# Patient Record
Sex: Female | Born: 1975 | Race: White | Hispanic: No | Smoking: Former smoker
Health system: Southern US, Community
[De-identification: ages and names within clinical notes are randomized; demographics above are authoritative.]

## PROBLEM LIST (undated history)

## (undated) DIAGNOSIS — N6452 Nipple discharge: Secondary | ICD-10-CM

## (undated) DIAGNOSIS — Z800 Family history of malignant neoplasm of digestive organs: Secondary | ICD-10-CM

## (undated) DIAGNOSIS — F32A Depression, unspecified: Secondary | ICD-10-CM

## (undated) DIAGNOSIS — B2790 Infectious mononucleosis, unspecified without complication: Secondary | ICD-10-CM

## (undated) DIAGNOSIS — I10 Essential (primary) hypertension: Secondary | ICD-10-CM

## (undated) DIAGNOSIS — M797 Fibromyalgia: Secondary | ICD-10-CM

## (undated) DIAGNOSIS — J45909 Unspecified asthma, uncomplicated: Secondary | ICD-10-CM

## (undated) DIAGNOSIS — A6920 Lyme disease, unspecified: Secondary | ICD-10-CM

## (undated) DIAGNOSIS — J189 Pneumonia, unspecified organism: Secondary | ICD-10-CM

## (undated) DIAGNOSIS — A770 Spotted fever due to Rickettsia rickettsii: Secondary | ICD-10-CM

## (undated) DIAGNOSIS — F319 Bipolar disorder, unspecified: Secondary | ICD-10-CM

## (undated) DIAGNOSIS — F419 Anxiety disorder, unspecified: Secondary | ICD-10-CM

## (undated) HISTORY — DX: Infectious mononucleosis, unspecified without complication: B27.90

## (undated) HISTORY — PX: WISDOM TOOTH EXTRACTION: SHX21

## (undated) HISTORY — DX: Family history of malignant neoplasm of digestive organs: Z80.0

## (undated) HISTORY — DX: Essential (primary) hypertension: I10

## (undated) HISTORY — PX: TUBAL LIGATION: SHX77

## (undated) HISTORY — DX: Lyme disease, unspecified: A69.20

## (undated) HISTORY — DX: Bipolar disorder, unspecified: F31.9

---

## 1998-05-31 ENCOUNTER — Encounter: Admission: RE | Admit: 1998-05-31 | Discharge: 1998-08-29 | Payer: Self-pay | Admitting: *Deleted

## 2002-07-04 ENCOUNTER — Encounter: Payer: Self-pay | Admitting: Family Medicine

## 2002-07-04 ENCOUNTER — Ambulatory Visit (HOSPITAL_COMMUNITY): Admission: RE | Admit: 2002-07-04 | Discharge: 2002-07-04 | Payer: Self-pay | Admitting: Family Medicine

## 2005-04-07 ENCOUNTER — Other Ambulatory Visit: Admission: RE | Admit: 2005-04-07 | Discharge: 2005-04-07 | Payer: Self-pay | Admitting: Family Medicine

## 2005-04-07 ENCOUNTER — Other Ambulatory Visit: Admission: RE | Admit: 2005-04-07 | Discharge: 2005-04-07 | Payer: Self-pay | Admitting: *Deleted

## 2010-07-20 ENCOUNTER — Encounter: Payer: Self-pay | Admitting: Family Medicine

## 2011-12-19 ENCOUNTER — Other Ambulatory Visit: Payer: Self-pay | Admitting: Physician Assistant

## 2011-12-19 ENCOUNTER — Ambulatory Visit
Admission: RE | Admit: 2011-12-19 | Discharge: 2011-12-19 | Disposition: A | Discharge status: Home / Self Care | Source: Ambulatory Visit | Attending: Physician Assistant | Admitting: Physician Assistant

## 2011-12-19 DIAGNOSIS — M545 Low back pain: Secondary | ICD-10-CM

## 2012-01-14 ENCOUNTER — Other Ambulatory Visit: Payer: Self-pay | Admitting: Family Medicine

## 2012-01-14 DIAGNOSIS — IMO0002 Reserved for concepts with insufficient information to code with codable children: Secondary | ICD-10-CM

## 2012-01-17 ENCOUNTER — Ambulatory Visit
Admission: RE | Admit: 2012-01-17 | Discharge: 2012-01-17 | Disposition: A | Discharge status: Home / Self Care | Source: Ambulatory Visit | Attending: Family Medicine | Admitting: Family Medicine

## 2012-01-17 DIAGNOSIS — IMO0002 Reserved for concepts with insufficient information to code with codable children: Secondary | ICD-10-CM

## 2013-01-17 ENCOUNTER — Encounter: Payer: Self-pay | Admitting: Physician Assistant

## 2013-01-17 ENCOUNTER — Ambulatory Visit (INDEPENDENT_AMBULATORY_CARE_PROVIDER_SITE_OTHER): Admitting: Physician Assistant

## 2013-01-17 VITALS — BP 146/96 | HR 68 | Temp 98.0°F | Resp 18 | Ht 66.0 in | Wt 242.0 lb

## 2013-01-17 DIAGNOSIS — R51 Headache: Secondary | ICD-10-CM

## 2013-01-17 DIAGNOSIS — R5381 Other malaise: Secondary | ICD-10-CM

## 2013-01-17 DIAGNOSIS — R110 Nausea: Secondary | ICD-10-CM

## 2013-01-17 DIAGNOSIS — M62838 Other muscle spasm: Secondary | ICD-10-CM

## 2013-01-17 DIAGNOSIS — R21 Rash and other nonspecific skin eruption: Secondary | ICD-10-CM

## 2013-01-17 DIAGNOSIS — R5383 Other fatigue: Secondary | ICD-10-CM

## 2013-01-17 DIAGNOSIS — IMO0001 Reserved for inherently not codable concepts without codable children: Secondary | ICD-10-CM

## 2013-01-17 NOTE — Progress Notes (Signed)
Patient ID: Sharon Merritt MRN: [id number], DOB: Sep 05, 1975, 37 y.o. Date of Encounter: @DATE @  Chief Complaint:  Chief Complaint  Patient presents with  . Rash    HPI: 37 y.o. year old female  presents with the following complaints:  1- Starting 6 days ago: she has had the following:  Rash: Has "come and gone off and on"-had rash on arms and thighs. Then developed a larger area of pink rash on medial aspect of left ankle. All of this occurred that Tuesday and Wednesday (5-6 days ago). Has had headache, body aches all over, diffuse joint aches-"even in my fingeres and my toes". Feels extremedly tired-more than usual. Has seen no tics.  2- Nausea: Had this when she had her LOV here-saw Dr. Modesto Charon here 04/29/12. Says she had nausea evne back then. Thought it was sec to Nsaids. He told her to take omeprazole with nsaids. She says she did this for a while but then quit meds. Thought she only needed to take omeprzole when she tood nsaids. She stopped meds but nausea has not impoved. Feels nauseas "off and on at any time"-before eats, while eats, after eats. Never vomits. Says her bowels have always alternated b/t diarrhea/constipation-says this is the same-no change in bowel habits. No abdominal pain. Just nausea.   3-For years, her right leg would get a "twitch"-at random times. Now, for about a year, her right arm/hand has started doing it too. Says it is even embarrassing. Can be holding soething, and all the sudden, her hand/arm twitches and jerks. Has caused her to drp objects.    History reviewed. No pertinent past medical history.   Home Meds: See attached medication section for current medication list. Any medications entered into computer today will not appear on this note's list. The medications listed below were entered prior to today. No current outpatient prescriptions on file prior to visit.   No current facility-administered medications on file prior to visit.    Allergies: No  Known Allergies  History   Social History  . Marital Status: Married    Spouse Name: N/A    Number of Children: N/A  . Years of Education: N/A   Occupational History  . Not on file.   Social History Main Topics  . Smoking status: Not on file  . Smokeless tobacco: Not on file  . Alcohol Use: Not on file  . Drug Use: Not on file  . Sexually Active: Not on file   Other Topics Concern  . Not on file   Social History Narrative  . No narrative on file    No family history on file.   Review of Systems:  See HPI for pertinent ROS. All other ROS negative.    Physical Exam: Blood pressure 146/96, pulse 68, temperature 98 F (36.7 C), resp. rate 18, height 5\' 6"  (1.676 m), weight 242 lb (109.77 kg)., Body mass index is 39.08 kg/(m^2). General: Obese WF. Appears in no acute distress. Neck: Supple. No thyromegaly. No lymphadenopathy. Lungs: Clear bilaterally to auscultation without wheezes, rales, or rhonchi. Breathing is unlabored. Heart: RRR with S1 S2. No murmurs, rubs, or gallops. Abdomen: Soft, non-tender, non-distended with normoactive bowel sounds. No hepatomegaly. No rebound/guarding. No obvious abdominal masses.There is no area of tenderness with palpation, including RUQ and Epigastric area. Musculoskeletal:  Strength and tone normal for age. Extremities/Skin: Warm and dry. No clubbing or cyanosis. No edema. Rashes are resolved.  Neuro: Alert and oriented X 3. Moves all extremities spontaneously. Gait is  normal. CNII-XII grossly in tact. Psych:  Responds to questions appropriately with a normal affect.     ASSESSMENT AND PLAN:  37 y.o. year old female with   EPIC WAS DOWN DURING THE TIME OF PTS OV.  i GAVE HER A HANDWRITTEN RX FOR DOXYCYCLINE 100MG  ONE PO BID X 21 DAYS. SHE IS TO START THIS NOW.  DISCUSSED THAT LYME, RMSF TITERS CAN BE FALSELY NEGATIVE SO SHE WILL COMPLETE THE DOXY EVEN IF THESE ARE NEGATIVE.  wILL CHECK LABS TO CHECK FOR OTHER SOURCES OF HER  SYMPTOMS.   aLSO GAVE RX OMEPRAZOLE 20MG  ONE PO QD TO SEE IF THIS HELPS NAUEA. IF NO BETTER IN 3 WEEKS, F/U.  WILL CHECK  LABS (CALCIUM POTASSIUM ETC) IF THESE ARE NML, WILL REFER TO NEURO FOR EVAL OF MUSCLE SPASMS/TWITCHES  1. Rash and nonspecific skin eruption - Rocky [place] spotted fvr abs pnl(IgG+IgM) - B. burgdorfi antibodies by WB  2. Headache(784.0) - CBC with Differential - COMPLETE METABOLIC PANEL WITH GFR - TSH - Rocky [place]tn spotted fvr abs pnl(IgG+IgM) - B. burgdorfi antibodies by WB  3. Myalgia and myositis - CBC with Differential - COMPLETE METABOLIC PANEL WITH GFR - TSH - Rocky [place]tn spotted fvr abs pnl(IgG+IgM) - B. burgdorfi antibodies by WB - Sedimentation rate  4. Other malaise and fatigue - CBC with Differential - COMPLETE METABOLIC PANEL WITH GFR - TSH - Rocky mtn spotted fvr abs pnl(IgG+IgM) - B. burgdorfi antibodies by WB - Sedimentation rate  5. Nausea alone - Helicobacter pylori abs-IgG+IgA, bld  6. Muscle spasm - Ambulatory referral to Neurology   Signed, [facility], [place], [facility] 01/17/2013 11:05 AM

## 2013-01-18 LAB — CBC WITH DIFFERENTIAL/PLATELET
Basophils Relative: 0 % (ref 0–1)
Eosinophils Absolute: 0.1 10*3/uL (ref 0.0–0.7)
Eosinophils Relative: 2 % (ref 0–5)
Lymphs Abs: 1.7 10*3/uL (ref 0.7–4.0)
MCH: 30.7 pg (ref 26.0–34.0)
MCHC: 34 g/dL (ref 30.0–36.0)
MCV: 90.4 fL (ref 78.0–100.0)
Platelets: 267 10*3/uL (ref 150–400)
RBC: 4.46 MIL/uL (ref 3.87–5.11)
RDW: 13.1 % (ref 11.5–15.5)

## 2013-01-18 LAB — COMPLETE METABOLIC PANEL WITH GFR
ALT: 18 U/L (ref 0–35)
Alkaline Phosphatase: 75 U/L (ref 39–117)
CO2: 28 mEq/L (ref 19–32)
Creat: 0.87 mg/dL (ref 0.50–1.10)
GFR, Est African American: >89 mL/min
GFR, Est Non African American: 84 mL/min
Total Bilirubin: 0.4 mg/dL (ref 0.3–1.2)

## 2013-01-18 LAB — ROCKY MTN SPOTTED FVR ABS PNL(IGG+IGM)
RMSF IgG: 2.85 IV — ABNORMAL HIGH
RMSF IgM: 0.23 IV

## 2013-01-18 LAB — HELICOBACTER PYLORI ABS-IGG+IGA, BLD: HELICOBACTER PYLORI AB, IGA: 3.8 U/mL (ref <9.0)

## 2013-01-21 ENCOUNTER — Telehealth: Payer: Self-pay | Admitting: Physician Assistant

## 2013-01-23 NOTE — Telephone Encounter (Signed)
Completed  See lab notes

## 2013-01-25 ENCOUNTER — Telehealth: Payer: Self-pay | Admitting: Physician Assistant

## 2013-01-25 NOTE — Telephone Encounter (Signed)
Spoke to patient.  She had questions about the Doxycycline and her RMSF test.  All questions were answered

## 2013-02-14 ENCOUNTER — Emergency Department (HOSPITAL_COMMUNITY)

## 2013-02-14 ENCOUNTER — Encounter (HOSPITAL_COMMUNITY): Payer: Self-pay | Admitting: Emergency Medicine

## 2013-02-14 ENCOUNTER — Observation Stay (HOSPITAL_COMMUNITY)
Admission: EM | Admit: 2013-02-14 | Discharge: 2013-02-15 | Disposition: A | Discharge status: Home / Self Care | Attending: Internal Medicine | Admitting: Internal Medicine

## 2013-02-14 DIAGNOSIS — R0609 Other forms of dyspnea: Secondary | ICD-10-CM | POA: Insufficient documentation

## 2013-02-14 DIAGNOSIS — R079 Chest pain, unspecified: Secondary | ICD-10-CM

## 2013-02-14 DIAGNOSIS — E669 Obesity, unspecified: Secondary | ICD-10-CM

## 2013-02-14 DIAGNOSIS — R0989 Other specified symptoms and signs involving the circulatory and respiratory systems: Secondary | ICD-10-CM | POA: Insufficient documentation

## 2013-02-14 DIAGNOSIS — R0789 Other chest pain: Principal | ICD-10-CM

## 2013-02-14 DIAGNOSIS — K219 Gastro-esophageal reflux disease without esophagitis: Secondary | ICD-10-CM

## 2013-02-14 HISTORY — DX: Fibromyalgia: M79.7

## 2013-02-14 HISTORY — DX: Spotted fever due to Rickettsia rickettsii: A77.0

## 2013-02-14 LAB — CBC WITH DIFFERENTIAL/PLATELET
Basophils Absolute: 0 10*3/uL (ref 0.0–0.1)
Basophils Relative: 0 % (ref 0–1)
Eosinophils Absolute: 0 10*3/uL (ref 0.0–0.7)
Hemoglobin: 13.5 g/dL (ref 12.0–15.0)
MCH: 31.5 pg (ref 26.0–34.0)
MCHC: 34.1 g/dL (ref 30.0–36.0)
Neutro Abs: 3.5 10*3/uL (ref 1.7–7.7)
Neutrophils Relative %: 54 % (ref 43–77)
Platelets: 255 10*3/uL (ref 150–400)
RDW: 12.4 % (ref 11.5–15.5)

## 2013-02-14 LAB — BASIC METABOLIC PANEL
BUN: 8 mg/dL (ref 6–23)
Chloride: 99 mEq/L (ref 96–112)
GFR calc Af Amer: >90 mL/min (ref >90)
GFR calc non Af Amer: 85 mL/min — ABNORMAL LOW (ref >90)
Potassium: 3.7 mEq/L (ref 3.5–5.1)
Sodium: 137 mEq/L (ref 135–145)

## 2013-02-14 LAB — TROPONIN I
Troponin I: <0.3 ng/mL (ref <0.30)
Troponin I: <0.3 ng/mL (ref <0.30)

## 2013-02-14 MED ORDER — ASPIRIN 81 MG PO CHEW
324.0000 mg | CHEWABLE_TABLET | Freq: Once | ORAL | Status: AC
  Administered 2013-02-14: 81 mg via ORAL
  Filled 2013-02-14: qty 4

## 2013-02-14 MED ORDER — SODIUM CHLORIDE 0.9 % IV SOLN: 250.0000 mL | INTRAVENOUS | Status: DC | PRN

## 2013-02-14 MED ORDER — SODIUM CHLORIDE 0.9 % IJ SOLN: 3.0000 mL | INTRAMUSCULAR | Status: DC | PRN

## 2013-02-14 MED ORDER — SODIUM CHLORIDE 0.9 % IJ SOLN: 3.0000 mL | Freq: Two times a day (BID) | INTRAMUSCULAR | Status: DC

## 2013-02-14 MED ORDER — ENOXAPARIN SODIUM 40 MG/0.4ML ~~LOC~~ SOLN
40.0000 mg | SUBCUTANEOUS | Status: DC
  Administered 2013-02-15: 40 mg via SUBCUTANEOUS
  Filled 2013-02-14: qty 0.4

## 2013-02-14 MED ORDER — NITROGLYCERIN 0.4 MG SL SUBL
0.4000 mg | SUBLINGUAL_TABLET | SUBLINGUAL | Status: AC | PRN
  Administered 2013-02-14 (×3): 0.4 mg via SUBLINGUAL
  Filled 2013-02-14: qty 25

## 2013-02-14 MED ORDER — ASPIRIN EC 81 MG PO TBEC
81.0000 mg | DELAYED_RELEASE_TABLET | Freq: Every day | ORAL | Status: DC
  Administered 2013-02-15: 81 mg via ORAL
  Filled 2013-02-14: qty 1

## 2013-02-14 MED ORDER — PANTOPRAZOLE SODIUM 40 MG PO TBEC
40.0000 mg | DELAYED_RELEASE_TABLET | Freq: Every day | ORAL | Status: DC
  Administered 2013-02-15: 40 mg via ORAL
  Filled 2013-02-14: qty 1

## 2013-02-14 MED ORDER — ONDANSETRON HCL 4 MG/2ML IJ SOLN: 4.0000 mg | Freq: Four times a day (QID) | INTRAMUSCULAR | Status: DC | PRN

## 2013-02-14 MED ORDER — ACETAMINOPHEN 325 MG PO TABS: 650.0000 mg | ORAL_TABLET | Freq: Four times a day (QID) | ORAL | Status: DC | PRN

## 2013-02-14 MED ORDER — SODIUM CHLORIDE 0.9 % IJ SOLN
3.0000 mL | Freq: Two times a day (BID) | INTRAMUSCULAR | Status: DC
  Administered 2013-02-14 – 2013-02-15 (×2): 3 mL via INTRAVENOUS

## 2013-02-14 MED ORDER — ACETAMINOPHEN 650 MG RE SUPP: 650.0000 mg | Freq: Four times a day (QID) | RECTAL | Status: DC | PRN

## 2013-02-14 MED ORDER — ONDANSETRON HCL 4 MG PO TABS: 4.0000 mg | ORAL_TABLET | Freq: Four times a day (QID) | ORAL | Status: DC | PRN

## 2013-02-14 NOTE — H&P (Addendum)
[facility] History and Physical  Marjean Imperato ZOX:[id number] DOB: [birth year] DOA: 02/14/2013   PCP: Frazier Richards, PA-C  Specialists: None  Chief Complaint: Chest tightness  HPI: Sharon Merritt is a 37 y.o. female with a past medical history of obesity, fibromyalgia, who was in her usual state of health this morning when she was sitting and working on a computer and she started having left-sided chest tightness. It was 8/10 in intensity. It radiated to her arm and her neck. She also felt a heaviness in the chest. She was short of breath and had nausea, but no vomiting. She was dizzy, but denies any syncopal episodes. She did have palpitations. No fever or chills. Did feel cold. She has never had these symptoms before. She was given aspirin and nitroglycerin in the ED, and her pain resolved. She does have a history of acid reflux and used to take Prilosec but is not taking that anymore. She was also diagnosed with [facility] spotted fever in end of July and has completed a course of doxycycline. She denies any stress test in the past. Denies any leg swelling. She has had high blood pressure in the past and does not take any medication for the same.  Home Medications: Prior to Admission medications   Medication Sig Start Date End Date Taking? Authorizing Provider  doxycycline (VIBRA-TABS) 100 MG tablet Take 100 mg by mouth 2 (two) times daily. COMPLETED COURSE   Yes Historical Provider, MD  ibuprofen (ADVIL,MOTRIN) 200 MG tablet Take 400 mg by mouth every 6 (six) hours as needed for pain.   Yes Historical Provider, MD  omeprazole (PRILOSEC) 20 MG capsule Take 20 mg by mouth daily.   Yes Historical Provider, MD    Allergies: No Known Allergies  Past Medical History: Past Medical History  Diagnosis Date  . Fibromyalgia   . Hypertension   . [place] spotted fever     Past Surgical History  Procedure Laterality Date  . Tubal ligation      Social History:  reports that  she quit smoking about 4 years ago. She does not have any smokeless tobacco history on file. She reports that she does not drink alcohol or use illicit drugs.  Living Situation: She lives with her husband Activity Level: Independent with daily activities   Family History:  Family History  Problem Relation Age of Onset  . COPD Mother   . Diabetes Father   . Hypertension Father      Review of Systems - History obtained from the patient General ROS: negative Psychological ROS: negative Ophthalmic ROS: negative ENT ROS: negative Allergy and Immunology ROS: negative Hematological and Lymphatic ROS: negative Endocrine ROS: negative Respiratory ROS: as in hpi Cardiovascular ROS: as in hpi Gastrointestinal ROS: no abdominal pain, change in bowel habits, or black or bloody stools Genito-Urinary ROS: no dysuria, trouble voiding, or hematuria Musculoskeletal ROS: negative Neurological ROS: no TIA or stroke symptoms Dermatological ROS: negative  Physical Examination  Filed Vitals:   02/14/13 1953 02/14/13 2030 02/14/13 2044 02/14/13 2100  BP: 134/76 128/76 127/69 111/69  Pulse: 66 67  68  Temp:      TempSrc:      Resp: 11 9  10   Height:      Weight:      SpO2: 96%       General appearance: alert, cooperative, appears stated age and no distress Head: Normocephalic, without obvious abnormality, atraumatic Eyes: conjunctivae/corneas clear. PERRL, EOM's intact. Throat: lips, mucosa, and tongue normal;  teeth and gums normal Neck: no adenopathy, no carotid bruit, no JVD, supple, symmetrical, trachea midline and thyroid not enlarged, symmetric, no tenderness/mass/nodules Back: symmetric, no curvature. ROM normal. No CVA tenderness. Resp: clear to auscultation bilaterally Cardio: regular rate and rhythm, S1, S2 normal, no murmur, click, rub or gallop GI: soft, non-tender; bowel sounds normal; no masses,  no organomegaly Extremities: extremities normal, atraumatic, no cyanosis or  edema Pulses: 2+ and symmetric Skin: Skin color, texture, turgor normal. No rashes or lesions Lymph nodes: Cervical, supraclavicular, and axillary nodes normal. Neurologic: She is alert and oriented x3. No focal neurological deficits are present.  Laboratory Data: Results for orders placed during the hospital encounter of 02/14/13 (from the past 48 hour(s))  CBC WITH DIFFERENTIAL     Status: None   Collection Time    02/14/13  7:29 PM      Result Value Range   WBC 6.5  4.0 - 10.5 K/uL   RBC 4.28  3.87 - 5.11 MIL/uL   Hemoglobin 13.5  12.0 - 15.0 g/dL   HCT 16.1  09.6 - 04.5 %   MCV 92.5  78.0 - 100.0 fL   MCH 31.5  26.0 - 34.0 pg   MCHC 34.1  30.0 - 36.0 g/dL   RDW 40.9  81.1 - 91.4 %   Platelets 255  150 - 400 K/uL   Neutrophils Relative % 54  43 - 77 %   Neutro Abs 3.5  1.7 - 7.7 K/uL   Lymphocytes Relative 36  12 - 46 %   Lymphs Abs 2.4  0.7 - 4.0 K/uL   Monocytes Relative 9  3 - 12 %   Monocytes Absolute 0.6  0.1 - 1.0 K/uL   Eosinophils Relative 1  0 - 5 %   Eosinophils Absolute 0.0  0.0 - 0.7 K/uL   Basophils Relative 0  0 - 1 %   Basophils Absolute 0.0  0.0 - 0.1 K/uL  BASIC METABOLIC PANEL     Status: Abnormal   Collection Time    02/14/13  7:29 PM      Result Value Range   Sodium 137  135 - 145 mEq/L   Potassium 3.7  3.5 - 5.1 mEq/L   Chloride 99  96 - 112 mEq/L   CO2 27  19 - 32 mEq/L   Glucose, Bld 87  70 - 99 mg/dL   BUN 8  6 - 23 mg/dL   Creatinine, Ser 7.82  0.50 - 1.10 mg/dL   Calcium 9.2  8.4 - 95.6 mg/dL   GFR calc non Af Amer 85 (*) >90 mL/min   GFR calc Af Amer >90  >90 mL/min   Comment: (NOTE)     The eGFR has been calculated using the CKD EPI equation.     This calculation has not been validated in all clinical situations.     eGFR's persistently <90 mL/min signify possible Chronic Kidney     Disease.  TROPONIN I     Status: None   Collection Time    02/14/13  7:29 PM      Result Value Range   Troponin I <0.30  <0.30 ng/mL   Comment:             Due to the release kinetics of cTnI,     a negative result within the first hours     of the onset of symptoms does not rule out     myocardial infarction with certainty.  If myocardial infarction is still suspected,     repeat the test at appropriate intervals.  D-DIMER, QUANTITATIVE     Status: None   Collection Time    02/14/13  7:29 PM      Result Value Range   D-Dimer, Quant <0.27  0.00 - 0.48 ug/mL-FEU   Comment:            AT THE INHOUSE ESTABLISHED CUTOFF     VALUE OF 0.48 ug/mL FEU,     THIS ASSAY HAS BEEN DOCUMENTED     IN THE LITERATURE TO HAVE     A SENSITIVITY AND NEGATIVE     PREDICTIVE VALUE OF AT LEAST     98 TO 99%.  THE TEST RESULT     SHOULD BE CORRELATED WITH     AN ASSESSMENT OF THE CLINICAL     PROBABILITY OF DVT / VTE.  TROPONIN I     Status: None   Collection Time    02/14/13  9:31 PM      Result Value Range   Troponin I <0.30  <0.30 ng/mL   Comment:            Due to the release kinetics of cTnI,     a negative result within the first hours     of the onset of symptoms does not rule out     myocardial infarction with certainty.     If myocardial infarction is still suspected,     repeat the test at appropriate intervals.    Radiology Reports: Dg Chest 2 View  02/14/2013   *RADIOLOGY REPORT*  Clinical Data: Chest pain and palpitations  CHEST - 2 VIEW  Comparison: None.  Findings: The heart, mediastinum and hila are unremarkable.  The lungs hyperexpanded with flat hemidiaphragms.  There is minor scarring in the anterior lung bases.  The lungs otherwise clear.  No pleural effusion or pneumothorax.  The bony thorax is intact.  IMPRESSION: No acute cardiopulmonary disease.  Findings suggest COPD.   Original Report Authenticated By: Amie Portland, M.D.    Electrocardiogram: Sinus rhythm at 70 beats per minute. Normal axis. Intervals are normal. No definite Q waves although there is suspicious looking tracings in V1 and V2. No acute ST or T-wave  changes are noted.  Problem List  Principal Problem:   Chest tightness Active Problems:   Obesity   GERD (gastroesophageal reflux disease)   Assessment: This is a 37 year old, Caucasian female, who presents with left-sided chest tightness at rest. Some of the features of the pain are concerning for angina. Her risk factors for CAD include obesity. She is a former smoker. She has been told that she has elevated cholesterol, but hasn't required treatment. Differential diagnoses include pain due to GI reasons. VTE is less likely, due to the character of the pain and the fact, that the pain resolved with nitroglycerin. Plus d-dimer is normal.  Plan: #1 chest tightness: She'll be observed overnight. Troponins will be ordered. TSH will be checked. Echocardiogram will be ordered. If she rules out she will require a stress test, which can be pursued as an outpatient. We'll give her aspirin. Lipid panel will be checked in the morning.  #2 Obesity: She will require, weight loss counseling.  #3 GERD: Resume PPI  DVT Prophylaxis: Lovenox Code Status: Full code Family Communication: Discussed with the patient and her husband  Disposition Plan: Observe on telemetry   Further management decisions will depend on results of further testing and  patient's response to treatment.  [facility]  [facility] Pager [phone]  If 7PM-7AM, please contact night-coverage [email or web address] Password [facility]  02/14/2013, 10:34 PM

## 2013-02-14 NOTE — ED Provider Notes (Signed)
CSN: [id number]     Arrival date & time 02/14/13  1709 History     First MD Initiated Contact with Patient 02/14/13 1729     Chief Complaint  Patient presents with  . Chest Pain  . Palpitations   (Consider location/radiation/quality/duration/timing/severity/associated sxs/prior Treatment) HPI Comments: 37 yo female with fibromyalgia, past smoker, recent RMSF/ completed abx presents with chest heaviness with right arm pain this am, mild dyspnea, intermittent sxs lasting minutes.  No exertional or diaphoresis. Mild nausea. Patient denies blood clot history, active cancer, recent major trauma or surgery, unilateral leg swelling/ pain, recent long travel, hemoptysis or oral contraceptives.  No known heart issues.  Nothing specific worsens.  Very mild heaviness in ED.   Patient is a 37 y.o. female presenting with chest pain and palpitations. The history is provided by the patient.  Chest Pain Pain location:  Substernal area Associated symptoms: nausea, palpitations and shortness of breath   Associated symptoms: no abdominal pain, no back pain, no fever, no headache and not vomiting   Palpitations Associated symptoms: chest pain, nausea and shortness of breath   Associated symptoms: no back pain and no vomiting     Past Medical History  Diagnosis Date  . Fibromyalgia   . Hypertension   . [facility] spotted fever    History reviewed. No pertinent past surgical history. No family history on file. History  Substance Use Topics  . Smoking status: Not on file  . Smokeless tobacco: Not on file  . Alcohol Use: Not on file   OB History   Grav Para Term Preterm Abortions TAB SAB Ect Mult Living                 Review of Systems  Constitutional: Negative for fever and chills.  HENT: Negative for neck pain and neck stiffness.   Eyes: Negative for visual disturbance.  Respiratory: Positive for shortness of breath.   Cardiovascular: Positive for chest pain and palpitations.   Gastrointestinal: Positive for nausea. Negative for vomiting and abdominal pain.  Genitourinary: Negative for dysuria and flank pain.  Musculoskeletal: Negative for back pain.  Skin: Negative for rash.  Neurological: Negative for light-headedness and headaches.    Allergies  Review of patient's allergies indicates no known allergies.  Home Medications   Current Outpatient Rx  Name  Route  Sig  Dispense  Refill  . doxycycline (VIBRA-TABS) 100 MG tablet   Oral   Take 100 mg by mouth 2 (two) times daily. COMPLETED COURSE         . ibuprofen (ADVIL,MOTRIN) 200 MG tablet   Oral   Take 400 mg by mouth every 6 (six) hours as needed for pain.         Marland Kitchen omeprazole (PRILOSEC) 20 MG capsule   Oral   Take 20 mg by mouth daily.          BP 152/90  Pulse 71  Temp(Src) 97.9 F (36.6 C) (Oral)  Resp 20  Ht 5\' 8"  (1.727 m)  Wt 237 lb (107.502 kg)  BMI 36.04 kg/m2  SpO2 100%  LMP 02/07/2013 Physical Exam  Nursing note and vitals reviewed. Constitutional: She is oriented to person, place, and time. She appears well-developed and well-nourished.  HENT:  Head: Normocephalic and atraumatic.  Eyes: Conjunctivae are normal. Right eye exhibits no discharge. Left eye exhibits no discharge.  Neck: Normal range of motion. Neck supple. No tracheal deviation present.  Cardiovascular: Normal rate, regular rhythm and intact distal pulses.  No murmur heard. Pulmonary/Chest: Effort normal and breath sounds normal.  Abdominal: Soft. She exhibits no distension. There is no tenderness. There is no guarding.  Musculoskeletal: She exhibits no edema and no tenderness.  Neurological: She is alert and oriented to person, place, and time.  Skin: Skin is warm. No rash noted.  Psychiatric: She has a normal mood and affect.    ED Course   Procedures (including critical care time)  Labs Reviewed  BASIC METABOLIC PANEL - Abnormal; Notable for the following:    GFR calc non Af Amer 85 (*)    All  other components within normal limits  CBC WITH DIFFERENTIAL  TROPONIN I  D-DIMER, QUANTITATIVE  TROPONIN I   No results found. No diagnosis found.  MDM  Well appearing.  Low risk cardiac.  No PE risks.  Vitals unremarkable. Plan for cardiac/ CXR screening. ASA GIVEN.   Date: 02/14/2013  Rate: 72  Rhythm: normal sinus rhythm  QRS Axis: normal  Intervals: normal  ST/T Wave abnormalities: nonspecific ST changes q wave V1/ V2  Conduction Disutrbances:none  Narrative Interpretation:   Old EKG Reviewed: none available Dg Chest 2 View  02/14/2013   *RADIOLOGY REPORT*  Clinical Data: Chest pain and palpitations  CHEST - 2 VIEW  Comparison: None.  Findings: The heart, mediastinum and hila are unremarkable.  The lungs hyperexpanded with flat hemidiaphragms.  There is minor scarring in the anterior lung bases.  The lungs otherwise clear.  No pleural effusion or pneumothorax.  The bony thorax is intact.  IMPRESSION: No acute cardiopulmonary disease.  Findings suggest COPD.   Original Report Authenticated By: Amie Portland, M.D.    Chest pain intermittent in ED, improved with nitro.  No old ekg.  Possible old septal.  Discussed outpt vs observation/tele, pt okay with observation.  Paged hospitalist for observation, they evaluated.     Enid Skeens, MD 02/16/13 [phone]

## 2013-02-14 NOTE — ED Notes (Signed)
States that she started having chest heaviness with right arm pain this morning.  States that she also felt short of breath.  States that her symptoms are intermittent.  States that she was recently diagnosed with [facility] Spotted Fever.

## 2013-02-15 DIAGNOSIS — R079 Chest pain, unspecified: Secondary | ICD-10-CM

## 2013-02-15 DIAGNOSIS — R072 Precordial pain: Secondary | ICD-10-CM

## 2013-02-15 LAB — CBC
HCT: 38.8 % (ref 36.0–46.0)
Hemoglobin: 13 g/dL (ref 12.0–15.0)
MCH: 31.3 pg (ref 26.0–34.0)
MCV: 93.3 fL (ref 78.0–100.0)
RBC: 4.16 MIL/uL (ref 3.87–5.11)

## 2013-02-15 LAB — COMPREHENSIVE METABOLIC PANEL
AST: 17 U/L (ref 0–37)
Albumin: 3.5 g/dL (ref 3.5–5.2)
Calcium: 8.8 mg/dL (ref 8.4–10.5)
Chloride: 102 mEq/L (ref 96–112)
Creatinine, Ser: 0.9 mg/dL (ref 0.50–1.10)
Total Bilirubin: 0.4 mg/dL (ref 0.3–1.2)
Total Protein: 7 g/dL (ref 6.0–8.3)

## 2013-02-15 LAB — LIPID PANEL
Cholesterol: 170 mg/dL (ref 0–200)
HDL: 43 mg/dL (ref >39)
Total CHOL/HDL Ratio: 4 RATIO
Triglycerides: 134 mg/dL (ref <150)
VLDL: 27 mg/dL (ref 0–40)

## 2013-02-15 MED ORDER — ASPIRIN 81 MG PO TBEC: 81.0000 mg | DELAYED_RELEASE_TABLET | Freq: Every day | ORAL | Status: DC

## 2013-02-15 NOTE — Progress Notes (Signed)
Discharged home today with instructions given on medications,and follow up visits,patient verbalized understanding.No c/o pain or discomfort noted .Vital signs stable. Accompanied by staff to an awaiting vehicle.

## 2013-02-15 NOTE — Discharge Summary (Addendum)
Physician Discharge Summary  Sharon Merritt MRN: [id number] DOB/AGE: Apr 29, 1976 37 y.o.  PCP: Sharon Richards, PA-C   Admit date: 02/14/2013 Discharge date: 02/15/2013  Discharge Diagnoses:    Chest pain noncardiac Active Problems:   Obesity   GERD (gastroesophageal reflux disease)   Followup PCP to arrange for outpatient stress test    Medication List    STOP taking these medications       doxycycline 100 MG tablet  Commonly known as:  VIBRA-TABS     ibuprofen 200 MG tablet  Commonly known as:  ADVIL,MOTRIN      TAKE these medications       aspirin 81 MG EC tablet  Take 1 tablet (81 mg total) by mouth daily.     omeprazole 20 MG capsule  Commonly known as:  PRILOSEC  Take 20 mg by mouth daily.        Discharge Condition: Stable  Disposition: Final discharge disposition not confirmed   Consults: None  Significant Diagnostic Studies: Dg Chest 2 View  02/14/2013   *RADIOLOGY REPORT*  Clinical Data: Chest pain and palpitations  CHEST - 2 VIEW  Comparison: None.  Findings: The heart, mediastinum and hila are unremarkable.  The lungs hyperexpanded with flat hemidiaphragms.  There is minor scarring in the anterior lung bases.  The lungs otherwise clear.  No pleural effusion or pneumothorax.  The bony thorax is intact.  IMPRESSION: No acute cardiopulmonary disease.  Findings suggest COPD.   Original Report Authenticated By: Amie Portland, M.D.    2-D echo pending   Microbiology: No results found for this or any previous visit (from the past 240 hour(s)).   Labs: Results for orders placed during the hospital encounter of 02/14/13 (from the past 48 hour(s))  CBC WITH DIFFERENTIAL     Status: None   Collection Time    02/14/13  7:29 PM      Result Value Range   WBC 6.5  4.0 - 10.5 K/uL   RBC 4.28  3.87 - 5.11 MIL/uL   Hemoglobin 13.5  12.0 - 15.0 g/dL   HCT 40.9  81.1 - 91.4 %   MCV 92.5  78.0 - 100.0 fL   MCH 31.5  26.0 - 34.0 pg   MCHC 34.1  30.0  - 36.0 g/dL   RDW 78.2  95.6 - 21.3 %   Platelets 255  150 - 400 K/uL   Neutrophils Relative % 54  43 - 77 %   Neutro Abs 3.5  1.7 - 7.7 K/uL   Lymphocytes Relative 36  12 - 46 %   Lymphs Abs 2.4  0.7 - 4.0 K/uL   Monocytes Relative 9  3 - 12 %   Monocytes Absolute 0.6  0.1 - 1.0 K/uL   Eosinophils Relative 1  0 - 5 %   Eosinophils Absolute 0.0  0.0 - 0.7 K/uL   Basophils Relative 0  0 - 1 %   Basophils Absolute 0.0  0.0 - 0.1 K/uL  BASIC METABOLIC PANEL     Status: Abnormal   Collection Time    02/14/13  7:29 PM      Result Value Range   Sodium 137  135 - 145 mEq/L   Potassium 3.7  3.5 - 5.1 mEq/L   Chloride 99  96 - 112 mEq/L   CO2 27  19 - 32 mEq/L   Glucose, Bld 87  70 - 99 mg/dL   BUN 8  6 - 23 mg/dL  Creatinine, Ser 0.85  0.50 - 1.10 mg/dL   Calcium 9.2  8.4 - 62.9 mg/dL   GFR calc non Af Amer 85 (*) >90 mL/min   GFR calc Af Amer >90  >90 mL/min   Comment: (NOTE)     The eGFR has been calculated using the CKD EPI equation.     This calculation has not been validated in all clinical situations.     eGFR's persistently <90 mL/min signify possible Chronic Kidney     Disease.  TROPONIN I     Status: None   Collection Time    02/14/13  7:29 PM      Result Value Range   Troponin I <0.30  <0.30 ng/mL   Comment:            Due to the release kinetics of cTnI,     a negative result within the first hours     of the onset of symptoms does not rule out     myocardial infarction with certainty.     If myocardial infarction is still suspected,     repeat the test at appropriate intervals.  D-DIMER, QUANTITATIVE     Status: None   Collection Time    02/14/13  7:29 PM      Result Value Range   D-Dimer, Quant <0.27  0.00 - 0.48 ug/mL-FEU   Comment:            AT THE INHOUSE ESTABLISHED CUTOFF     VALUE OF 0.48 ug/mL FEU,     THIS ASSAY HAS BEEN DOCUMENTED     IN THE LITERATURE TO HAVE     A SENSITIVITY AND NEGATIVE     PREDICTIVE VALUE OF AT LEAST     98 TO 99%.  THE  TEST RESULT     SHOULD BE CORRELATED WITH     AN ASSESSMENT OF THE CLINICAL     PROBABILITY OF DVT / VTE.  TROPONIN I     Status: None   Collection Time    02/14/13  9:31 PM      Result Value Range   Troponin I <0.30  <0.30 ng/mL   Comment:            Due to the release kinetics of cTnI,     a negative result within the first hours     of the onset of symptoms does not rule out     myocardial infarction with certainty.     If myocardial infarction is still suspected,     repeat the test at appropriate intervals.  TROPONIN I     Status: None   Collection Time    02/15/13  3:30 AM      Result Value Range   Troponin I <0.30  <0.30 ng/mL   Comment:            Due to the release kinetics of cTnI,     a negative result within the first hours     of the onset of symptoms does not rule out     myocardial infarction with certainty.     If myocardial infarction is still suspected,     repeat the test at appropriate intervals.  COMPREHENSIVE METABOLIC PANEL     Status: Abnormal   Collection Time    02/15/13  3:40 AM      Result Value Range   Sodium 138  135 - 145 mEq/L   Potassium 3.8  3.5 - 5.1 mEq/L  Chloride 102  96 - 112 mEq/L   CO2 28  19 - 32 mEq/L   Glucose, Bld 96  70 - 99 mg/dL   BUN 8  6 - 23 mg/dL   Creatinine, Ser 7.82  0.50 - 1.10 mg/dL   Calcium 8.8  8.4 - 95.6 mg/dL   Total Protein 7.0  6.0 - 8.3 g/dL   Albumin 3.5  3.5 - 5.2 g/dL   AST 17  0 - 37 U/L   ALT 15  0 - 35 U/L   Alkaline Phosphatase 70  39 - 117 U/L   Total Bilirubin 0.4  0.3 - 1.2 mg/dL   GFR calc non Af Amer 80 (*) >90 mL/min   GFR calc Af Amer >90  >90 mL/min   Comment: (NOTE)     The eGFR has been calculated using the CKD EPI equation.     This calculation has not been validated in all clinical situations.     eGFR's persistently <90 mL/min signify possible Chronic Kidney     Disease.  CBC     Status: None   Collection Time    02/15/13  3:40 AM      Result Value Range   WBC 6.8  4.0 - 10.5  K/uL   RBC 4.16  3.87 - 5.11 MIL/uL   Hemoglobin 13.0  12.0 - 15.0 g/dL   HCT 21.3  08.6 - 57.8 %   MCV 93.3  78.0 - 100.0 fL   MCH 31.3  26.0 - 34.0 pg   MCHC 33.5  30.0 - 36.0 g/dL   RDW 46.9  62.9 - 52.8 %   Platelets 244  150 - 400 K/uL  LIPID PANEL     Status: Abnormal   Collection Time    02/15/13  3:40 AM      Result Value Range   Cholesterol 170  0 - 200 mg/dL   Triglycerides 413  <244 mg/dL   HDL 43  >01 mg/dL   Total CHOL/HDL Ratio 4.0     VLDL 27  0 - 40 mg/dL   LDL Cholesterol 027 (*) 0 - 99 mg/dL   Comment:            Total Cholesterol/HDL:CHD Risk     Coronary Heart Disease Risk Table                         Men   Women      1/2 Average Risk   3.4   3.3      Average Risk       5.0   4.4      2 X Average Risk   9.6   7.1      3 X Average Risk  23.4   11.0                Use the calculated Patient Ratio     above and the CHD Risk Table     to determine the patient's CHD Risk.                ATP III CLASSIFICATION (LDL):      <100     mg/dL   Optimal      253-664  mg/dL   Near or Above                        Optimal      130-159  mg/dL  Borderline      160-189  mg/dL   High      >161     mg/dL   Very High  TROPONIN I     Status: None   Collection Time    02/15/13  9:08 AM      Result Value Range   Troponin I <0.30  <0.30 ng/mL   Comment:            Due to the release kinetics of cTnI,     a negative result within the first hours     of the onset of symptoms does not rule out     myocardial infarction with certainty.     If myocardial infarction is still suspected,     repeat the test at appropriate intervals.     HPI :Sharon Merritt is a 37 y.o. female with a past medical history of obesity, fibromyalgia, who was in her usual state of health this morning when she was sitting and working on a computer and she started having left-sided chest tightness. It was 8/10 in intensity. It radiated to her arm and her neck. She also felt a heaviness in the chest.  She was short of breath and had nausea, but no vomiting. She was dizzy, but denies any syncopal episodes. She did have palpitations. No fever or chills. Did feel cold. She has never had these symptoms before. She was given aspirin and nitroglycerin in the ED, and her pain resolved. She does have a history of acid reflux and used to take Prilosec but is not taking that anymore. She was also diagnosed with [facility] spotted fever in end of July and has completed a course of doxycycline. She denies any stress test in the past. Denies any leg swelling. She has had high blood pressure in the past and does not take any medication for the same.   HOSPITAL COURSE:  Chest pain D-dimer negative Cardiac enzymes negative Lipid panel within normal limits Patient recommended to take a baby aspirin She is morbidly obese and previous smoker and will benefit from an outpatient stress test 2-D echo pending, once completed the patient will be discharged home today Patient also has a history of fibromyalgia and her pain is likely musculoskeletal She also takes ibuprofen and most likely has a component of gastroesophageal reflux disease and therefore she will continue with Prilosec   Morbid obesity weight loss counseling, done, patient advised to continue with exercises at least 2-3 times a week     Discharge Exam:   Blood pressure 118/74, pulse 66, temperature 98 F (36.7 C), temperature source Oral, resp. rate 16, height 5\' 8"  (1.727 m), weight 108.6 kg (239 lb 6.7 oz), last menstrual period 02/07/2013, SpO2 96.00%. General appearance: alert, cooperative, appears stated age and no distress  Head: Normocephalic, without obvious abnormality, atraumatic  Eyes: conjunctivae/corneas clear. PERRL, EOM's intact.  Throat: lips, mucosa, and tongue normal; teeth and gums normal  Neck: no adenopathy, no carotid bruit, no JVD, supple, symmetrical, trachea midline and thyroid not enlarged, symmetric, no  tenderness/mass/nodules  Back: symmetric, no curvature. ROM normal. No CVA tenderness.  Resp: clear to auscultation bilaterally  Cardio: regular rate and rhythm, S1, S2 normal, no murmur, click, rub or gallop  GI: soft, non-tender; bowel sounds normal; no masses, no organomegaly  Extremities: extremities normal, atraumatic, no cyanosis or edema  Pulses: 2+ and symmetric  Skin: Skin color, texture, turgor normal. No rashes or lesions  Lymph nodes: Cervical, supraclavicular, and axillary  nodes normal.  Neurologic: She is alert and oriented x3. No focal neurological deficits are present.          Future Appointments Provider Department Dept Phone   02/17/2013 9:00 AM Omelia Blackwater, [place] [facility] [phone]        Signed: Richarda Overlie 02/15/2013, 10:46 AM

## 2013-02-15 NOTE — Progress Notes (Signed)
*  PRELIMINARY RESULTS* Echocardiogram 2D Echocardiogram has been performed.  Sharon Merritt 02/15/2013, 1:31 PM

## 2013-02-15 NOTE — Progress Notes (Signed)
UR Chart Review Completed  

## 2013-02-17 ENCOUNTER — Other Ambulatory Visit: Payer: Self-pay | Admitting: Neurology

## 2013-02-17 ENCOUNTER — Ambulatory Visit (INDEPENDENT_AMBULATORY_CARE_PROVIDER_SITE_OTHER): Admitting: Neurology

## 2013-02-17 ENCOUNTER — Encounter: Payer: Self-pay | Admitting: Neurology

## 2013-02-17 VITALS — BP 150/90 | HR 81 | Ht 68.0 in | Wt 239.0 lb

## 2013-02-17 DIAGNOSIS — R259 Unspecified abnormal involuntary movements: Secondary | ICD-10-CM | POA: Insufficient documentation

## 2013-02-17 DIAGNOSIS — IMO0001 Reserved for inherently not codable concepts without codable children: Secondary | ICD-10-CM

## 2013-02-17 DIAGNOSIS — I10 Essential (primary) hypertension: Secondary | ICD-10-CM

## 2013-02-17 DIAGNOSIS — M797 Fibromyalgia: Secondary | ICD-10-CM | POA: Insufficient documentation

## 2013-02-17 DIAGNOSIS — A779 Spotted fever, unspecified: Secondary | ICD-10-CM

## 2013-02-17 DIAGNOSIS — A770 Spotted fever due to Rickettsia rickettsii: Secondary | ICD-10-CM | POA: Insufficient documentation

## 2013-02-17 NOTE — Progress Notes (Signed)
[facility]  Provider:  Dr Hosie Poisson Referring Provider: Deon Pilling Primary Care Physician:  Frazier Richards, PA-C  CC:  Abnormal movements  HPI:  Sharon Merritt is a 37 y.o. female here as a referral from Dr. Durwin Nora for evaluation of abnormal movements  Sharon Bolger reports the symptoms started years ago. Initially started in the right leg and progressed to involve the right hand also. She feels they're happening more frequently. She describes different types of movements in her hand, can sometimes be a brief myoclonic jerk, can be a rotatory type of movement or can be a low amplitude tremor. The movements in the hands typically last a few minutes. This occurs multiple times per week. In her foot she has just brief more myoclonic-type jerk movements. These are occurring multiple times per week also. She denies any triggering factors, no alleviating factors. There is no aura warning that the symptoms are coming on. She is unable to stop her control the movements, she notes she tries to hold her hand the tremor seems to spread or get worse. Denies any  motor changes. Has occasional episodes where she feels a tingling or vibration, this can involve most often the right side but can involve the whole body also. No weakness no left-sided symptoms.  Denies any acute issues. Denies any history of visual changes, no blurring no loss of vision, no painful eye movements.r more tremor or more rythmic movements, foot does more brief jerks. No weakness. No history of visual changes.Occasional muscle cramps.  No family hx of tremor or Sharon or other neurological conditions. No hx neck or head trauma. Past medical history includes recent diagnosis of [facility] spotted fever for which she was treated with doxycycline.  Review of Systems: Out of a complete 14 system review, the patient complains of only the following symptoms, and all other reviewed systems are negative. Positive for fatigue  chest pain shortness of breath increased thirst joint pain aching muscles ringing in ears spinning sensation headache numbness slurred speech dizziness tremor sleepiness  History   Social History  . Marital Status: Married    Spouse Name: N/A    Number of Children: N/A  . Years of Education: N/A   Occupational History  . Not on file.   Social History Main Topics  . Smoking status: Former Smoker    Quit date: 02/14/2009  . Smokeless tobacco: Not on file  . Alcohol Use: No  . Drug Use: No  . Sexual Activity: Not on file   Other Topics Concern  . Not on file   Social History Narrative  . No narrative on file    Family History  Problem Relation Age of Onset  . COPD Mother   . Diabetes Father   . Hypertension Father     Past Medical History  Diagnosis Date  . Fibromyalgia   . Hypertension   . [place] spotted fever     Past Surgical History  Procedure Laterality Date  . Tubal ligation      Current Outpatient Prescriptions  Medication Sig Dispense Refill  . aspirin EC 81 MG EC tablet Take 1 tablet (81 mg total) by mouth daily.  30 tablet  0  . omeprazole (PRILOSEC) 20 MG capsule Take 20 mg by mouth daily.       No current facility-administered medications for this visit.    Allergies as of 02/17/2013  . (No Known Allergies)    Vitals: LMP 02/07/2013 Last Weight:  Wt  Readings from Last 1 Encounters:  02/14/13 239 lb 6.7 oz (108.6 kg)   Last Height:   Ht Readings from Last 1 Encounters:  02/14/13 5\' 8"  (1.727 m)     Physical exam: Exam: Gen: NAD, conversant Eyes: anicteric sclerae, moist conjunctivae HENT: Atraumatic Neck: Trachea midline; supple,  Lungs: CTA, no wheezing, rales, rhonic                          CV: RRR, no MRG Abdomen: Soft, non-tender;  Extremities: No peripheral edema  Skin: Normal temperature, no rash,  Psych: Appropriate affect, pleasant  Neuro: Sharon: AA&Ox3, appropriately interactive, normal affect   Speech:  fluent w/o paraphasic error  Memory: good recent and remote recall  CN: PERRL, EOMI no nystagmus, no ptosis, sensation intact to LT V1-V3 bilat, face symmetric, no weakness, hearing grossly intact, palate elevates symmetrically, shoulder shrug 5/5 bilat,  tongue protrudes midline, no fasiculations noted.  Motor: normal bulk and tone, no abnormal movements noted. No tremor with distraction, arm extension or winged position Strength: 5/5  In all extremities  Coord: rapid alternating and point-to-point (FNF, HTS) movements intact. No intention tremor  Reflexes: symmetrical, bilat downgoing toes  Sens: LT intact in all extremities  Gait: posture, stance, stride and arm-swing normal. Tandem gait intact. Able to walk on heels and toes. Romberg absent.   Assessment:  After physical and neurologic examination, review of laboratory studies, imaging, neurophysiology testing and pre-existing records, assessment will be reviewed on the problem list.  Plan:  Treatment plan and additional workup will be reviewed under Problem List.  Sharon. Merritt is a pleasant 37 year old Caucasian woman presents for initial evaluation of multiple year history of involuntary abnormal movements of her right side. These affect the upper extremity more than the lower and have gotten progressively worse. She is being typically hold on to objects occasionally and has dropped objects. In the right hand she describes multiple different types of involuntary movements, there is a more rhythmic tremor type movement, rhythmic rotatory movement and brief myoclonic jerks. The lower extremity she describes more of a brief myoclonic jerk. The upper extremity can last a few minutes and lower extremity a few seconds. Occurring multiple times throughout the week. Her physical exam was unremarkable with no abnormal involuntary movements noted. The etiology is unclear for these events. Based on her age and presentation would consider multiple  sclerosis. Metabolic abnormalities, such as thyroid should be considered, based on her age would also consider Wilson's disease doses unlikely. Events appear atypical but would consider partial seizure. Based on the variability of the movements in the right upper extremity this raises the question of a functional component. This would be a diagnosis of exclusion.  1)Abnormal Involuntary movements: Unclear etiology -Will check MRI of the brain with and without contrast -Will check ceruloplasmin, per review of old labs a recent TSH and calcium were both found to be normal -In the future can consider EEG to rule out partial seizure and or EMG to check for potential myoclonic discharge -Can consider low-dose Klonopin in the future for symptomatic relief but will hold off at this time pending workup -follow up in 3 months

## 2013-02-17 NOTE — Patient Instructions (Signed)
Overall you are doing fairly well but I do want to suggest a few things today:   Remember to drink plenty of fluid, eat healthy meals and do not skip any meals. Try to eat protein with a every meal and eat a healthy snack such as fruit or nuts in between meals. Try to keep a regular sleep-wake schedule and try to exercise daily, particularly in the form of walking, 20-30 minutes a day, if you can.   As far as diagnostic testing: I would like to suggest getting a MRI of the brain to check for possible causes of your symptoms. We will also draw some blood work.   I would like to see you back in 3 to 4months, sooner if we need to. Please call us with any interim questions, concerns, problems, updates or refill requests.   Please also call us for any test results so we can go over those with you on the phone.  My clinical assistant and will answer any of your questions and relay your messages to me and also relay most of my messages to you.   Our phone number is [phone]. We also have an after hours call service for urgent matters and there is a physician on-call for urgent questions. For any emergencies you know to call 911 or go to the nearest emergency room

## 2013-02-23 ENCOUNTER — Encounter: Payer: Self-pay | Admitting: Physician Assistant

## 2013-02-23 ENCOUNTER — Ambulatory Visit (INDEPENDENT_AMBULATORY_CARE_PROVIDER_SITE_OTHER): Admitting: Physician Assistant

## 2013-02-23 VITALS — HR 76 | Temp 98.4°F | Resp 18 | Ht 66.0 in | Wt 240.0 lb

## 2013-02-23 DIAGNOSIS — IMO0001 Reserved for inherently not codable concepts without codable children: Secondary | ICD-10-CM

## 2013-02-23 DIAGNOSIS — R0789 Other chest pain: Secondary | ICD-10-CM

## 2013-02-23 DIAGNOSIS — M797 Fibromyalgia: Secondary | ICD-10-CM

## 2013-02-23 MED ORDER — METAXALONE 800 MG PO TABS: 800.0000 mg | ORAL_TABLET | Freq: Four times a day (QID) | ORAL | Status: DC

## 2013-02-23 MED ORDER — GABAPENTIN 300 MG PO CAPS: ORAL_CAPSULE | ORAL | Status: DC

## 2013-02-23 NOTE — Progress Notes (Signed)
Patient ID: Jonet Mathies MRN: [id number], DOB: [birth year], 37 y.o. Date of Encounter: @DATE @  Chief Complaint:  Chief Complaint  Patient presents with  . hosp follow up    SOB,chest pain  wants results of echo cardiogram    HPI: 37 y.o. year old white female  presents for followup of her recent hospitalization. She was admitted overnight on 02/14/2013. She presented with complaints of chest pain. All labs were normal. These included a d-dimer, cardiac enzymes, cemented, CBC, lipid profile, and TSH. Also chest x-ray was normal. EKG was within normal limits. As well they did an echocardiogram. She had not been given his results when she was discharged from the hospital. I have printed the echocardiogram report today. This is completely normal. She says that, discharge they felt that her symptoms were most likely secondary to fibromyalgia. She has a history of fibromyalgia. She tells me that this was diagnosed in 2007. She states that she was on Lyrica in 2007. Then in 2008-2009 she was on Cymbalta. She says that the Lyrica made her unable to urinate so they Lyrica was stopped. She says that the Cymbalta did not help so this was stopped.  She reports that she still has a sensation of discomfort around her entire chest. She says that this is constantly present since she went to the hospital.. 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 However she says that she lift nothing heavy. She does note type of exercise or stretching or yoga.   Past Medical History  Diagnosis Date  . Fibromyalgia   . Hypertension   . [place] spotted fever      Home Meds: See attached medication section for current medication list. Any medications entered into computer today will not appear on this note's list. The medications listed below were entered prior to today. Current Outpatient Prescriptions on File Prior to Visit  Medication Sig Dispense Refill  . aspirin EC 81 MG EC  tablet Take 1 tablet (81 mg total) by mouth daily.  30 tablet  0  . ibuprofen (ADVIL,MOTRIN) 200 MG tablet Take 200 mg by mouth every 6 (six) hours as needed for pain.      Marland Kitchen omeprazole (PRILOSEC) 20 MG capsule Take 20 mg by mouth daily.       No current facility-administered medications on file prior to visit.    Allergies: No Known Allergies  History   Social History  . Marital Status: Married    Spouse Name: [name]     Number of Children: 3  . Years of Education: Assoc    Occupational History  .     Social History Main Topics  . Smoking status: Former Smoker    Quit date: 02/14/2009  . Smokeless tobacco: Never Used  . Alcohol Use: 0.0 oz/week    0 Cans of beer per week     Comment: rarely   . Drug Use: No  . Sexual Activity: Not on file   Other Topics Concern  . Not on file   Social History Narrative   Patient lives at home with her husband [name]) and her children.    Patient works full time.   Caffeine- Tea 3-4 times daily.          Family History  Problem Relation Age of Onset  . COPD Mother   . Diabetes Father   . Hypertension Father      Review of Systems:  See HPI for pertinent  ROS. All other ROS negative.    Physical Exam: Pulse 76, temperature 98.4 F (36.9 C), temperature source Oral, resp. rate 18, height 5\' 6"  (1.676 m), weight 240 lb (108.863 kg), last menstrual period 02/07/2013., Body mass index is 38.76 kg/(m^2). General: Obese white female. Appears in no acute distress. Lungs: Clear bilaterally to auscultation without wheezes, rales, or rhonchi. Breathing is unlabored. Heart: RRR with S1 S2. No murmurs, rubs, or gallops. Musculoskeletal:  Strength and tone normal for age. Palpation of chest wall reveals multiple areas of tenderness with palpation. Extremities/Skin: Warm and dry.  No edema. Neuro: Alert and oriented X 3. Moves all extremities spontaneously. Gait is normal. CNII-XII grossly in tact. Psych:  Responds to questions  appropriately with a normal affect.     ASSESSMENT AND PLAN:  37 y.o. year old female with  1. Fibromyalgia She has a friend who uses Neurontin and has had good success with this. I explained that this is similar medicine to Lyrica but she does want to try this. As well we will use a muscle relaxer. Will try Skelaxin as this usually causes less drowsiness than some others. Cautioned her that this can cause drowsiness and not to use this prior to driving or operating machinery or going to work until she sees how this affects her. - gabapentin (NEURONTIN) 300 MG capsule; Take 1 daily for 1 day Then 1 twice a day for 1 day Then 1 Three times a day  Dispense: 90 capsule; Refill: 3 - metaxalone (SKELAXIN) 800 MG tablet; Take 1 tablet (800 mg total) by mouth 4 (four) times daily.  Dispense: 60 tablet; Refill: 1  2. Chest tightness 40 minutes was spent with the patient in direct contact with her. Time was spent reviewing her hospital records as well as from past records here. Her recent hospitalization secondary to chest tightness is felt to be secondary to fibromyalgia. We'll treat with the above medications and followup.   Signed, [address] [place], [place], [facility] 02/23/2013 11:59 AM

## 2013-02-24 ENCOUNTER — Telehealth: Payer: Self-pay

## 2013-02-24 NOTE — Telephone Encounter (Signed)
Called and left message normal labs.

## 2013-02-24 NOTE — Telephone Encounter (Signed)
Message copied by Glyndon Tursi R on Thu Feb 24, 2013 11:17 AM ------      Message from: Ramond Marrow      Created: Wed Feb 23, 2013  3:55 PM       Please let her know her blood work was normal. Thanks ------

## 2013-02-26 ENCOUNTER — Ambulatory Visit
Admission: RE | Admit: 2013-02-26 | Discharge: 2013-02-26 | Disposition: A | Discharge status: Home / Self Care | Source: Ambulatory Visit | Attending: Neurology | Admitting: Neurology

## 2013-02-26 DIAGNOSIS — R259 Unspecified abnormal involuntary movements: Secondary | ICD-10-CM

## 2013-02-26 MED ORDER — GADOBENATE DIMEGLUMINE 529 MG/ML IV SOLN
20.0000 mL | Freq: Once | INTRAVENOUS | Status: AC | PRN
  Administered 2013-02-26: 20 mL via INTRAVENOUS

## 2013-03-02 ENCOUNTER — Telehealth: Payer: Self-pay

## 2013-03-02 NOTE — Telephone Encounter (Signed)
I called patient and spoke with her husband. I gave patient's husband results of MRI as per Dr. Hosie Poisson. I asked if patient continuing to have symptoms. Husband states she still has tingling in feet and tightness in chest. Is taking her medication that was ordered and that has helped significantly.   Husband stated that he was going to see about looking into medication classes for the both of them. I let them know that he can google free meditation classes in his area or check with local fitness centers. He can usually get a free week of introductory classes and they may have medication. He said he might try yoga too. I let him know to do a beginner yoga class and it can be quite a workout for someone who has never done it. Also check with local fitness centers for these classes as well.  Husband thanked me for information and said they will look into it.  I asked that they call with increased symptoms or concerns or we will see them in November.

## 2013-03-02 NOTE — Telephone Encounter (Signed)
Message copied by [facility] on Wed Mar 02, 2013  3:51 PM ------      Message from: Ramond Marrow      Created: Wed Mar 02, 2013  3:23 PM       Please let Ms Luepke know her MRI was normal. Thanks. ------

## 2013-05-05 ENCOUNTER — Other Ambulatory Visit: Payer: Self-pay

## 2013-05-20 ENCOUNTER — Ambulatory Visit (INDEPENDENT_AMBULATORY_CARE_PROVIDER_SITE_OTHER): Admitting: Neurology

## 2013-05-20 ENCOUNTER — Encounter: Payer: Self-pay | Admitting: Neurology

## 2013-05-20 VITALS — BP 144/87 | HR 84 | Ht 67.0 in | Wt 247.0 lb

## 2013-05-20 DIAGNOSIS — R209 Unspecified disturbances of skin sensation: Secondary | ICD-10-CM

## 2013-05-20 DIAGNOSIS — R202 Paresthesia of skin: Secondary | ICD-10-CM

## 2013-05-20 NOTE — Progress Notes (Addendum)
[facility]  Provider:  Dr Hosie Poisson Referring Provider: Deon Pilling Primary Care Physician:  Frazier Richards, PA-C  CC:  Abnormal movements  37y/o with fibromyalgia presenting for return visit evaluation of abnormal involuntary movements. Since last visit had lab workup and MRI of the brain both of which were unremarkable. Stopped taking Neurontin due to "high feeling", feels it did not help the tremor. Tremor is only on the right side, UE >LE. Described as a rotatory movement, can be slow or fast movements. She is unsure if it is triggered by stress or anxiety. No triggering factors. Shaking lasts a few seconds, unable to control it. Notes some periods of pausing in her walking, will last for a few seconds and then resumes her walking.   Has diagnosis of fibromyalgia, diagnosed by her PCP. Has been on Cymbalta and Lyrica, stopped due to poor tolerance.   Initial Visit 01/2013: Ms. Boyko is a pleasant 37 year old Caucasian woman presents for initial evaluation of multiple year history of involuntary abnormal movements of her right side. These affect the upper extremity more than the lower and have gotten progressively worse. She is being typically hold on to objects occasionally and has dropped objects. In the right hand she describes multiple different types of involuntary movements, there is a more rhythmic tremor type movement, rhythmic rotatory movement and brief myoclonic jerks. The lower extremity she describes more of a brief myoclonic jerk. The upper extremity can last a few minutes and lower extremity a few seconds. Occurring multiple times throughout the week. Her physical exam was unremarkable with no abnormal involuntary movements noted. The etiology is unclear for these events. Based on her age and presentation would consider multiple sclerosis. Metabolic abnormalities, such as thyroid should be considered, based on her age would also consider Wilson's disease doses  unlikely. Events appear atypical but would consider partial seizure. Based on the variability of the movements in the right upper extremity this raises the question of a functional component. This would be a diagnosis of exclusion.   Review of Systems: Out of a complete 14 system review, the patient complains of only the following symptoms, and all other reviewed systems are negative. Positive for fatigue blurred vision shortness of breath feeling hot feeling cold increased thirst joint pain cramps aching muscles constipation ringing in ears spinning sensation birthmarks incontinence headache numbness weakness slurred speech tremor none of sleep decreased energy insomnia sleepiness snoring shiftwork restless legs  History   Social History  . Marital Status: Married    Spouse Name: [name]     Number of Children: 3  . Years of Education: Assoc    Occupational History  .     Social History Main Topics  . Smoking status: Former Smoker    Quit date: 02/14/2009  . Smokeless tobacco: Never Used  . Alcohol Use: 0.0 oz/week    0 Cans of beer per week     Comment: rarely   . Drug Use: No  . Sexual Activity: Not on file   Other Topics Concern  . Not on file   Social History Narrative   Patient lives at home with her husband [name]) and her children.    Patient works full time.   Caffeine- Tea 3-4 times daily.   Patient is right-handed.   Patient has a Scientist, research (physical sciences).             Family History  Problem Relation Age of Onset  . COPD Mother   . Diabetes  Father   . Hypertension Father     Past Medical History  Diagnosis Date  . Fibromyalgia   . Hypertension   . [place] spotted fever     Past Surgical History  Procedure Laterality Date  . Tubal ligation      Current Outpatient Prescriptions  Medication Sig Dispense Refill  . aspirin EC 81 MG EC tablet Take 1 tablet (81 mg total) by mouth daily.  30 tablet  0  . ibuprofen (ADVIL,MOTRIN) 200 MG tablet Take  200 mg by mouth every 6 (six) hours as needed for pain.      . metaxalone (SKELAXIN) 800 MG tablet Take 1 tablet (800 mg total) by mouth 4 (four) times daily.  60 tablet  1  . omeprazole (PRILOSEC) 20 MG capsule Take 20 mg by mouth daily.       No current facility-administered medications for this visit.    Allergies as of 05/20/2013  . (No Known Allergies)    Vitals: BP 144/87  Pulse 84  Ht 5\' 7"  (1.702 m)  Wt 247 lb (112.038 kg)  BMI 38.68 kg/m2 Last Weight:  Wt Readings from Last 1 Encounters:  05/20/13 247 lb (112.038 kg)   Last Height:   Ht Readings from Last 1 Encounters:  05/20/13 5\' 7"  (1.702 m)     Physical exam: Exam: Gen: NAD, conversant Eyes: anicteric sclerae, moist conjunctivae HENT: Atraumatic Neck: Trachea midline; supple,  Lungs: CTA, no wheezing, rales, rhonic                          CV: RRR, no MRG Abdomen: Soft, non-tender;  Extremities: No peripheral edema  Skin: Normal temperature, no rash,  Psych: Appropriate affect, pleasant  Neuro: MS: AA&Ox3, appropriately interactive, normal affect   Speech: fluent w/o paraphasic error  Memory: good recent and remote recall  CN: PERRL, EOMI no nystagmus, no ptosis, sensation intact to LT V1-V3 bilat, face symmetric, no weakness, hearing grossly intact, palate elevates symmetrically, shoulder shrug 5/5 bilat,  tongue protrudes midline, no fasiculations noted.  Motor: normal bulk and tone, no abnormal movements noted. Very mild rest tremor of the right hand with distraction, minimal re emergent tremor right hand with walking,  Strength: 5/5  In all extremities  Coord: rapid alternating and point-to-point (FNF, HTS) movements intact. No intention tremor  Reflexes: symmetrical, bilat downgoing toes  Sens: LT intact in all extremities  Gait: posture, stance, stride and arm-swing normal. Tandem gait intact. Able to walk on heels and toes. Romberg absent.   Assessment:  After physical and neurologic  examination, review of laboratory studies, imaging, neurophysiology testing and pre-existing records, assessment will be reviewed on the problem list.  Plan:  Treatment plan and additional workup will be reviewed under Problem List.   37 year old woman presenting for follow up evaluation of abnormal involuntary movements, she describes a rotatory tremor at and questionable myoclonic jerking movements of right upper tremor in right lower showed a. She also notes paresthesias predominantly the right upper extremity. On exam no myoclonic jerks are noted but patient did have very minimal rest and intention tremor of the right hand. Will check MRI of the cervical spine and EMG nerve conduction study. Tremor is mild at this point, discussed with patient is no indication for medication treatment at this time. Will plan to follow every 6 months. Based on clinical description and exam there is question of a functional component.  1)Abnormal Involuntary movements: Unclear etiology -  MRI the cervical spine for evaluation of history of RUE paresthesias and questionable myoclonic jerks of RUE -EMG nerve conduction study for paresthesia evaluation -follow up in 3 months

## 2013-05-20 NOTE — Patient Instructions (Signed)
Overall you are doing fairly well but I do want to suggest a few things today:   We will get a MRI of the cervical spine and EMG/NCS.  I would like to see you back in 6 months, sooner if we need to. Please call us with any interim questions, concerns, problems, updates or refill requests.   Please also call us for any test results so we can go over those with you on the phone.  My clinical assistant and will answer any of your questions and relay your messages to me and also relay most of my messages to you.   Our phone number is [phone]. We also have an after hours call service for urgent matters and there is a physician on-call for urgent questions. For any emergencies you know to call 911 or go to the nearest emergency room

## 2013-06-03 ENCOUNTER — Ambulatory Visit (INDEPENDENT_AMBULATORY_CARE_PROVIDER_SITE_OTHER): Admitting: Neurology

## 2013-06-03 ENCOUNTER — Encounter (INDEPENDENT_AMBULATORY_CARE_PROVIDER_SITE_OTHER): Payer: Self-pay

## 2013-06-03 DIAGNOSIS — R209 Unspecified disturbances of skin sensation: Secondary | ICD-10-CM

## 2013-06-03 DIAGNOSIS — R202 Paresthesia of skin: Secondary | ICD-10-CM

## 2013-06-03 DIAGNOSIS — G544 Lumbosacral root disorders, not elsewhere classified: Secondary | ICD-10-CM

## 2013-06-03 DIAGNOSIS — Z0289 Encounter for other administrative examinations: Secondary | ICD-10-CM

## 2013-06-03 NOTE — Procedures (Signed)
HISTORY:  Sharon Merritt is a 37 year old patient with a history of fibromyalgia who also reports a one-year history of numbness in tingling sensations down the right arm and right leg. The patient reports some neck and low back discomfort. The patient is being evaluated for the above issues.  NERVE CONDUCTION STUDIES:  Nerve conduction studies were performed on both upper extremities. The distal motor latencies and motor amplitudes for the median and ulnar nerves were within normal limits. The F wave latencies and nerve conduction velocities for these nerves were also normal. The sensory latencies for the median and ulnar nerves were normal.  Nerve conduction studies were performed on the right lower extremity. The distal motor latencies and motor amplitudes for the peroneal and posterior tibial nerves were within normal limits. The nerve conduction velocities for these nerves were also normal. The sensory latencies for the peroneal nerve was within normal limits.   EMG STUDIES:  EMG study was performed on the right upper extremity:  The first dorsal interosseous muscle reveals 2 to 4 K units with full recruitment. No fibrillations or positive waves were noted. The abductor pollicis brevis muscle reveals 2 to 4 K units with full recruitment. No fibrillations or positive waves were noted. The extensor indicis proprius muscle reveals 1 to 3 K units with full recruitment. No fibrillations or positive waves were noted. The pronator teres muscle reveals 2 to 3 K units with full recruitment. No fibrillations or positive waves were noted. The biceps muscle reveals 1 to 2 K units with full recruitment. No fibrillations or positive waves were noted. The triceps muscle reveals 2 to 4 K units with full recruitment. No fibrillations or positive waves were noted. The anterior deltoid muscle reveals 2 to 3 K units with full recruitment. No fibrillations or positive waves were noted. The cervical  paraspinal muscles were tested at 2 levels. No abnormalities of insertional activity were seen at either level tested. There was good relaxation.  EMG study was performed on the right lower extremity:  The tibialis anterior muscle reveals 2 to 4K motor units with full recruitment. No fibrillations or positive waves were seen. The peroneus tertius muscle reveals 2 to 4K motor units with decreased recruitment. No fibrillations or positive waves were seen. The medial gastrocnemius muscle reveals 1 to 3K motor units with full recruitment. No fibrillations or positive waves were seen. The vastus lateralis muscle reveals 2 to 4K motor units with full recruitment. No fibrillations or positive waves were seen. The iliopsoas muscle reveals 2 to 4K motor units with full recruitment. No fibrillations or positive waves were seen. The biceps femoris muscle (long head) reveals 2 to 4K motor units with full recruitment. One plus fibrillations and positive waves were seen. The lumbosacral paraspinal muscles were tested at 3 levels, and revealed no abnormalities of insertional activity at the upper and middle levels tested. One plus fibrillations and positive waves were seen at the lower level. There was good relaxation.   IMPRESSION:  Nerve conduction studies done on both upper extremities and the right lower extremity were unremarkable, without evidence of a peripheral neuropathy. EMG evaluation of the right upper extremity was unremarkable, without evidence of a right cervical radiculopathy. EMG evaluation of the right lower extremity shows mild, subtle changes of denervation consistent with a L5 radiculopathy with acute and chronic features. Clinical correlation is required.  Marlan Palau MD 06/03/2013 10:55 AM  [facility] [address] [place], [place] [address]  Phone [phone] Fax [phone]  

## 2013-06-04 ENCOUNTER — Ambulatory Visit
Admission: RE | Admit: 2013-06-04 | Discharge: 2013-06-04 | Disposition: A | Discharge status: Home / Self Care | Source: Ambulatory Visit | Attending: Neurology | Admitting: Neurology

## 2013-06-04 DIAGNOSIS — R209 Unspecified disturbances of skin sensation: Secondary | ICD-10-CM

## 2013-06-04 DIAGNOSIS — R202 Paresthesia of skin: Secondary | ICD-10-CM

## 2013-06-16 ENCOUNTER — Telehealth: Payer: Self-pay | Admitting: Neurology

## 2013-06-16 DIAGNOSIS — M542 Cervicalgia: Secondary | ICD-10-CM

## 2013-06-17 NOTE — Telephone Encounter (Signed)
Called patient to discuss EMG/NCS and MRI results. She is having some neck pain and paresthesias. Will place request for PT.

## 2013-07-19 ENCOUNTER — Ambulatory Visit: Attending: Neurology | Admitting: Rehabilitative and Restorative Service Providers"

## 2013-07-19 DIAGNOSIS — IMO0001 Reserved for inherently not codable concepts without codable children: Secondary | ICD-10-CM | POA: Insufficient documentation

## 2013-07-19 DIAGNOSIS — M542 Cervicalgia: Secondary | ICD-10-CM | POA: Insufficient documentation

## 2013-07-19 DIAGNOSIS — M5030 Other cervical disc degeneration, unspecified cervical region: Secondary | ICD-10-CM | POA: Insufficient documentation

## 2013-07-26 ENCOUNTER — Ambulatory Visit: Admitting: Rehabilitative and Restorative Service Providers"

## 2013-07-28 ENCOUNTER — Ambulatory Visit: Admitting: Rehabilitative and Restorative Service Providers"

## 2013-08-02 ENCOUNTER — Ambulatory Visit: Attending: Neurology | Admitting: Rehabilitative and Restorative Service Providers"

## 2013-08-02 DIAGNOSIS — M542 Cervicalgia: Secondary | ICD-10-CM | POA: Insufficient documentation

## 2013-08-02 DIAGNOSIS — M5030 Other cervical disc degeneration, unspecified cervical region: Secondary | ICD-10-CM | POA: Insufficient documentation

## 2013-08-02 DIAGNOSIS — IMO0001 Reserved for inherently not codable concepts without codable children: Secondary | ICD-10-CM | POA: Insufficient documentation

## 2013-08-05 ENCOUNTER — Ambulatory Visit: Admitting: Rehabilitative and Restorative Service Providers"

## 2013-08-09 ENCOUNTER — Ambulatory Visit: Admitting: Rehabilitative and Restorative Service Providers"

## 2013-08-12 ENCOUNTER — Ambulatory Visit: Admitting: Rehabilitative and Restorative Service Providers"

## 2013-08-17 ENCOUNTER — Ambulatory Visit: Admitting: Rehabilitative and Restorative Service Providers"

## 2013-08-24 ENCOUNTER — Ambulatory Visit: Admitting: Rehabilitative and Restorative Service Providers"

## 2013-08-31 ENCOUNTER — Ambulatory Visit: Attending: Neurology | Admitting: Rehabilitative and Restorative Service Providers"

## 2013-08-31 DIAGNOSIS — M5030 Other cervical disc degeneration, unspecified cervical region: Secondary | ICD-10-CM | POA: Insufficient documentation

## 2013-08-31 DIAGNOSIS — IMO0001 Reserved for inherently not codable concepts without codable children: Secondary | ICD-10-CM | POA: Insufficient documentation

## 2013-08-31 DIAGNOSIS — M542 Cervicalgia: Secondary | ICD-10-CM | POA: Insufficient documentation

## 2013-09-22 ENCOUNTER — Encounter: Payer: Self-pay | Admitting: Family Medicine

## 2013-09-22 ENCOUNTER — Ambulatory Visit (INDEPENDENT_AMBULATORY_CARE_PROVIDER_SITE_OTHER): Admitting: Family Medicine

## 2013-09-22 VITALS — BP 120/88 | HR 86 | Temp 97.1°F | Resp 20 | Ht 67.0 in | Wt 238.0 lb

## 2013-09-22 DIAGNOSIS — J209 Acute bronchitis, unspecified: Secondary | ICD-10-CM

## 2013-09-22 MED ORDER — AZITHROMYCIN 250 MG PO TABS: ORAL_TABLET | ORAL | Status: DC

## 2013-09-22 MED ORDER — HYDROCODONE-HOMATROPINE 5-1.5 MG/5ML PO SYRP: 5.0000 mL | ORAL_SOLUTION | Freq: Three times a day (TID) | ORAL | Status: DC | PRN

## 2013-09-22 NOTE — Progress Notes (Signed)
   Subjective:    Patient ID: Sharon Merritt, female    DOB: 05/29/1976, 38 y.o.   MRN: [id number]  HPI Patient had flulike symptoms for 4 days. These include subjective fever, sweats, diffuse myalgias, diffuse headache, rhinorrhea, sore throat, and nonproductive cough. She denies any shortness of breath or pleurisy. She does report chest congestion. She has a history of tobacco abuse although she is no longer smoking. Past Medical History  Diagnosis Date  . Fibromyalgia   . Hypertension   . [facility] spotted fever    No current outpatient prescriptions on file prior to visit.   No current facility-administered medications on file prior to visit.   No Known Allergies History   Social History  . Marital Status: Married    Spouse Name: [name]     Number of Children: 3  . Years of Education: Assoc    Occupational History  .     Social History Main Topics  . Smoking status: Former Smoker    Quit date: 02/14/2009  . Smokeless tobacco: Never Used  . Alcohol Use: 0.0 oz/week    0 Cans of beer per week     Comment: rarely   . Drug Use: No  . Sexual Activity: Not on file   Other Topics Concern  . Not on file   Social History Narrative   Patient lives at home with her husband [name]) and her children.    Patient works full time.   Caffeine- Tea 3-4 times daily.   Patient is right-handed.   Patient has a Geophysicist/field seismologist.               Review of Systems  All other systems reviewed and are negative.       Objective:   Physical Exam  Vitals reviewed. Constitutional: She appears well-developed and well-nourished.  HENT:  Right Ear: External ear normal.  Left Ear: External ear normal.  Nose: Nose normal.  Mouth/Throat: Oropharynx is clear and moist. No oropharyngeal exudate.  Eyes: Conjunctivae are normal.  Neck: Neck supple.  Cardiovascular: Normal rate, regular rhythm and normal heart sounds.   No murmur heard. Pulmonary/Chest: Effort normal. No  respiratory distress. She has wheezes. She has no rales. She exhibits no tenderness.  Abdominal: Soft. Bowel sounds are normal.  Lymphadenopathy:    She has no cervical adenopathy.          Assessment & Plan:  1. Acute bronchitis Patient has a flulike illness that has developed into bronchitis. I recommended tincture of time. I recommended Mucinex DM for cough. I recommended Sudafed for congestion. I recommended ibuprofen for body aches and fever. I did give the patient Hycodan 1 teaspoon every 8 hours as needed for cough. Also gave her a prescription for a Z-Pak. However I recommended that she not get the Z-Pak unless symptoms worsen, the cough becomes productive of green sputum, she developed a high fever, or shortness of breath worsens - azithromycin (ZITHROMAX) 250 MG tablet; 2 tabs poqday1, 1 tab poqday 2-5  Dispense: 6 tablet; Refill: 0 - HYDROcodone-homatropine (HYCODAN) 5-1.5 MG/5ML syrup; Take 5 mLs by mouth every 8 (eight) hours as needed for cough.  Dispense: 120 mL; Refill: 0

## 2014-10-09 ENCOUNTER — Encounter: Payer: Self-pay | Admitting: Family Medicine

## 2014-10-09 ENCOUNTER — Ambulatory Visit (INDEPENDENT_AMBULATORY_CARE_PROVIDER_SITE_OTHER): Admitting: Family Medicine

## 2014-10-09 VITALS — BP 130/74 | HR 78 | Temp 98.5°F | Resp 12 | Ht 67.0 in | Wt 208.0 lb

## 2014-10-09 DIAGNOSIS — R5383 Other fatigue: Secondary | ICD-10-CM | POA: Diagnosis not present

## 2014-10-09 DIAGNOSIS — M5030 Other cervical disc degeneration, unspecified cervical region: Secondary | ICD-10-CM | POA: Insufficient documentation

## 2014-10-09 DIAGNOSIS — M5136 Other intervertebral disc degeneration, lumbar region: Secondary | ICD-10-CM

## 2014-10-09 DIAGNOSIS — M797 Fibromyalgia: Secondary | ICD-10-CM | POA: Diagnosis not present

## 2014-10-09 MED ORDER — TRAMADOL HCL 50 MG PO TABS: 50.0000 mg | ORAL_TABLET | Freq: Three times a day (TID) | ORAL | Status: DC | PRN

## 2014-10-09 NOTE — Progress Notes (Signed)
Patient ID: Sharon Merritt, female   DOB: 1975-11-09, 39 y.o.   MRN: [id number]   Subjective:    Patient ID: Sharon Merritt, female    DOB: July 24, 1975, 39 y.o.   MRN: [id number]  Patient presents for Illness  patient year with back pain and muscle aches fatigue worsening over the past week or so. 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 She's been on multiple medications in the past and has been unable to tolerate them. She is actually very active and does cross fit on a regular basis she denies injury anytime the past couple weeks. States that the joint pain and muscle pain all over his not her typical and feels worse than her typical fibromyalgia flares. She's not had any recent illness no fever. She has taken Skelaxin which has helped some especially with her upper back. She denies any change in bowel bladder denies any new tingling or numbness in her lower extremities  She does take a host of supplements including D3, B-6, B-12, Ester C, K 12 and biotin    Review Of Systems:  GEN- denies fatigue, fever, weight loss,weakness, recent illness HEENT- denies eye drainage, change in vision, nasal discharge, CVS- denies chest pain, palpitations RESP- denies SOB, cough, wheeze ABD- denies N/V, change in stools, abd pain GU- denies dysuria, hematuria, dribbling, incontinence MSK-+ joint pain, +muscle aches, injury Neuro- denies headache, dizziness, syncope, seizure activity       Objective:    BP 130/74 mmHg  Pulse 78  Temp(Src) 98.5 F (36.9 C) (Oral)  Resp 12  Ht 5' 7"  (1.702 m)  Wt 208 lb (94.348 kg)  BMI 32.57 kg/m2  LMP 10/07/2014 (Approximate) GEN- NAD, alert and oriented x3 HEENT- PERRL, EOMI, non injected sclera, pink conjunctiva, MMM, oropharynx clear Neck- Supple,normal FROM  CVS- RRR, no murmur RESP-CTAB MSK- TTP between shoulder bleeds, paraspinals upper and lower back, +SLR right side, fair ROM HIPS,  SPINE Bilat hands- no swelling EXT- No edema Pulses- Radial, DP- 2+        Assessment & Plan:      Problem List Items Addressed This Visit      Unprioritized   Fibromyalgia - Primary    Most likley cause of all the symptoms, I will check some baseline labs on her, per above for medications. No joint swellings noted on exam      Relevant Medications   ibuprofen (ADVIL,MOTRIN) 200 MG tablet   Other Relevant Orders   ANA (Completed)   Fatigue   Relevant Orders   Comprehensive metabolic panel (Completed)   CBC with Differential/Platelet (Completed)   TSH (Completed)   Vitamin B12 (Completed)   Vitamin D, 25-hydroxy (Completed)   ANA (Completed)   DDD (degenerative disc disease), lumbar    I think we have 2 different things going on, one is flare of DDD spine with some radicular /sciatica, other is the fibromyalgia component She looks well appearing She is very sensitive to meds and does not even tolerate steroids, will give Ultram She can also use her Muscle relaxer      Relevant Medications   ibuprofen (ADVIL,MOTRIN) 200 MG tablet   metaxalone (SKELAXIN) 800 MG tablet   traMADol (ULTRAM) tablet 50 mg      Note: This dictation was prepared with Dragon dictation along with smaller phrase technology. Any transcriptional errors that result from this process are unintentional.

## 2014-10-09 NOTE — Patient Instructions (Signed)
Try tramadol and use skelaxin We will call with labs within 48 hours  F/U pending results

## 2014-10-10 LAB — CBC WITH DIFFERENTIAL/PLATELET
BASOS PCT: 0 % (ref 0–1)
Basophils Absolute: 0 10*3/uL (ref 0.0–0.1)
EOS PCT: 1 % (ref 0–5)
Eosinophils Absolute: 0.1 10*3/uL (ref 0.0–0.7)
HEMATOCRIT: 42.2 % (ref 36.0–46.0)
HEMOGLOBIN: 14.1 g/dL (ref 12.0–15.0)
LYMPHS ABS: 2.3 10*3/uL (ref 0.7–4.0)
Lymphocytes Relative: 31 % (ref 12–46)
MCH: 31.3 pg (ref 26.0–34.0)
MCHC: 33.4 g/dL (ref 30.0–36.0)
MCV: 93.6 fL (ref 78.0–100.0)
MONOS PCT: 8 % (ref 3–12)
MPV: 10.9 fL (ref 8.6–12.4)
Monocytes Absolute: 0.6 10*3/uL (ref 0.1–1.0)
Neutro Abs: 4.4 10*3/uL (ref 1.7–7.7)
Neutrophils Relative %: 60 % (ref 43–77)
Platelets: 242 10*3/uL (ref 150–400)
RBC: 4.51 MIL/uL (ref 3.87–5.11)
RDW: 12.9 % (ref 11.5–15.5)
WBC: 7.4 10*3/uL (ref 4.0–10.5)

## 2014-10-10 LAB — COMPREHENSIVE METABOLIC PANEL
ALBUMIN: 4 g/dL (ref 3.5–5.2)
ALT: 18 U/L (ref 0–35)
AST: 20 U/L (ref 0–37)
Alkaline Phosphatase: 50 U/L (ref 39–117)
BUN: 15 mg/dL (ref 6–23)
CALCIUM: 9.6 mg/dL (ref 8.4–10.5)
CHLORIDE: 100 meq/L (ref 96–112)
CO2: 27 meq/L (ref 19–32)
Creat: 0.78 mg/dL (ref 0.50–1.10)
Glucose, Bld: 88 mg/dL (ref 70–99)
POTASSIUM: 4.7 meq/L (ref 3.5–5.3)
Sodium: 137 mEq/L (ref 135–145)
Total Bilirubin: 0.3 mg/dL (ref 0.2–1.2)
Total Protein: 6.7 g/dL (ref 6.0–8.3)

## 2014-10-10 LAB — ANA: ANA: NEGATIVE

## 2014-10-10 LAB — VITAMIN D 25 HYDROXY (VIT D DEFICIENCY, FRACTURES): Vit D, 25-Hydroxy: 45 ng/mL (ref 30–100)

## 2014-10-10 LAB — VITAMIN B12: Vitamin B-12: 1431 pg/mL — ABNORMAL HIGH (ref 211–911)

## 2014-10-10 LAB — TSH: TSH: 3.039 u[IU]/mL (ref 0.350–4.500)

## 2014-10-10 NOTE — Assessment & Plan Note (Signed)
Most likley cause of all the symptoms, I will check some baseline labs on her, per above for medications. No joint swellings noted on exam

## 2014-10-10 NOTE — Assessment & Plan Note (Signed)
I think we have 2 different things going on, one is flare of DDD spine with some radicular /sciatica, other is the fibromyalgia component She looks well appearing She is very sensitive to meds and does not even tolerate steroids, will give Ultram She can also use her Muscle relaxer

## 2014-10-11 ENCOUNTER — Encounter: Payer: Self-pay | Admitting: *Deleted

## 2014-10-19 ENCOUNTER — Telehealth: Payer: Self-pay | Admitting: Physician Assistant

## 2014-10-19 NOTE — Telephone Encounter (Signed)
Patients husband calling to say that Winslow's pain medication is not working please call him back at  [phone]

## 2014-10-19 NOTE — Telephone Encounter (Signed)
Call placed to patient husband.   Advised that patient will need to be seen to discuss pain management.   Appointment scheduled.

## 2014-10-23 ENCOUNTER — Ambulatory Visit (INDEPENDENT_AMBULATORY_CARE_PROVIDER_SITE_OTHER): Admitting: Family Medicine

## 2014-10-23 ENCOUNTER — Encounter: Payer: Self-pay | Admitting: Family Medicine

## 2014-10-23 VITALS — BP 120/74 | HR 76 | Temp 98.8°F | Resp 14 | Ht 67.0 in | Wt 210.0 lb

## 2014-10-23 DIAGNOSIS — N926 Irregular menstruation, unspecified: Secondary | ICD-10-CM | POA: Diagnosis not present

## 2014-10-23 DIAGNOSIS — M797 Fibromyalgia: Secondary | ICD-10-CM

## 2014-10-23 DIAGNOSIS — R300 Dysuria: Secondary | ICD-10-CM

## 2014-10-23 DIAGNOSIS — M5136 Other intervertebral disc degeneration, lumbar region: Secondary | ICD-10-CM | POA: Diagnosis not present

## 2014-10-23 DIAGNOSIS — Z1239 Encounter for other screening for malignant neoplasm of breast: Secondary | ICD-10-CM | POA: Diagnosis not present

## 2014-10-23 LAB — URINALYSIS, ROUTINE W REFLEX MICROSCOPIC
Bilirubin Urine: NEGATIVE
Glucose, UA: NEGATIVE mg/dL
KETONES UR: NEGATIVE mg/dL
Leukocytes, UA: NEGATIVE
Nitrite: NEGATIVE
PH: 7 (ref 5.0–8.0)
Protein, ur: NEGATIVE mg/dL
SPECIFIC GRAVITY, URINE: 1.02 (ref 1.005–1.030)
Urobilinogen, UA: 0.2 mg/dL (ref 0.0–1.0)

## 2014-10-23 LAB — URINALYSIS, MICROSCOPIC ONLY
BACTERIA UA: NONE SEEN
CASTS: NONE SEEN
CRYSTALS: NONE SEEN

## 2014-10-23 LAB — PREGNANCY, URINE: Preg Test, Ur: NEGATIVE

## 2014-10-23 MED ORDER — TRAMADOL HCL 50 MG PO TABS: 100.0000 mg | ORAL_TABLET | Freq: Three times a day (TID) | ORAL | Status: DC | PRN

## 2014-10-23 NOTE — Assessment & Plan Note (Signed)
I will try increasing her tramadol 200 mg 3 times a day in addition to her Skelaxin. If this does not help we did discuss using amitriptyline she has already tried  Lyrica, gabapentin, Cymbalta, Zoloft

## 2014-10-23 NOTE — Patient Instructions (Addendum)
Increase tramadol to 182m three time a day Take entire tablet of skelaxin in the evenings Call if your menstrual cycle does not stop Call if pain does not improve with this regimen, then we can try the Elavil

## 2014-10-23 NOTE — Progress Notes (Signed)
Patient ID: Sharon Merritt, female   DOB: 01/28/1976, 39 y.o.   MRN: [id number]   Subjective:    Patient ID: Sharon Merritt, female    DOB: 03/30/1976, 39 y.o.   MRN: [id number]  Patient presents for Pain  patient here to follow medications. I saw her about 3 weeks ago at that time about her and tramadol in addition to her Skelaxin to be used for back pain as well as fibromyalgia. She has been on multiple medications in the past in either had side effects or they did not help. She states the tramadol helps take the edge off but she still has pain and was not sure if she could increase the dose. She also has had 2 menstrual cycles over the past month her period started again yesterday she typically uses once a month. She's not had any significant abdominal pain or cramping or vaginal discharge. Mild dysuria over weekend Her husband has had a vasectomy. She also requests a mammogram    Review Of Systems:  GEN- denies fatigue, fever, weight loss,weakness, recent illness HEENT- denies eye drainage, change in vision, nasal discharge, CVS- denies chest pain, palpitations RESP- denies SOB, cough, wheeze ABD- denies N/V, change in stools, abd pain GU- +dysuria, hematuria, dribbling, incontinence MSK-+ joint pain, +muscle aches, injury Neuro- denies headache, dizziness, syncope, seizure activity       Objective:    BP 120/74 mmHg  Pulse 76  Temp(Src) 98.8 F (37.1 C) (Oral)  Resp 14  Ht 5' 7"  (1.702 m)  Wt 210 lb (95.255 kg)  BMI 32.88 kg/m2  LMP 10/21/2014 GEN- NAD, alert and oriented x3 MSK- TTP upper and lower ext- multiple trigger points, paraspinals upper and lower back,fair ROM HIPS, SPINE Ext- no edema      Assessment & Plan:      Problem List Items Addressed This Visit    Fibromyalgia - Primary   DDD (degenerative disc disease), lumbar   Relevant Medications   traMADol (ULTRAM) 50 MG tablet    Other Visit Diagnoses    Dysuria        Relevant Orders    Urinalysis,  Routine w reflex microscopic (Completed)    Abnormal menses        Likley hormonal change, as the first period that is early, will monitor for now, Upreg neg, UA neg, if this persist will obtain US for DUB    Relevant Orders    Pregnancy, urine (Completed)    Breast cancer screening        Relevant Orders    MM DIGITAL SCREENING BILATERAL       Note: This dictation was prepared with Dragon dictation along with smaller phrase technology. Any transcriptional errors that result from this process are unintentional.

## 2014-10-30 ENCOUNTER — Encounter: Payer: Self-pay | Admitting: *Deleted

## 2014-11-06 ENCOUNTER — Other Ambulatory Visit: Payer: Self-pay | Admitting: Family Medicine

## 2014-11-06 DIAGNOSIS — Z1231 Encounter for screening mammogram for malignant neoplasm of breast: Secondary | ICD-10-CM

## 2014-12-04 ENCOUNTER — Ambulatory Visit (HOSPITAL_COMMUNITY)
Admission: RE | Admit: 2014-12-04 | Discharge: 2014-12-04 | Disposition: A | Discharge status: Home / Self Care | Source: Ambulatory Visit | Attending: Family Medicine | Admitting: Family Medicine

## 2014-12-04 DIAGNOSIS — Z1231 Encounter for screening mammogram for malignant neoplasm of breast: Secondary | ICD-10-CM | POA: Diagnosis present

## 2014-12-06 ENCOUNTER — Emergency Department (INDEPENDENT_AMBULATORY_CARE_PROVIDER_SITE_OTHER)

## 2014-12-06 ENCOUNTER — Emergency Department (INDEPENDENT_AMBULATORY_CARE_PROVIDER_SITE_OTHER)
Admission: EM | Admit: 2014-12-06 | Discharge: 2014-12-06 | Disposition: A | Discharge status: Home / Self Care | Source: Home / Self Care | Attending: Family Medicine | Admitting: Family Medicine

## 2014-12-06 ENCOUNTER — Encounter (HOSPITAL_COMMUNITY): Payer: Self-pay | Admitting: Emergency Medicine

## 2014-12-06 DIAGNOSIS — S43004A Unspecified dislocation of right shoulder joint, initial encounter: Secondary | ICD-10-CM
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
## 2014-12-06 NOTE — ED Notes (Signed)
Pt declined sling; notified Dr. Georgina Snell
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
## 2014-12-06 NOTE — ED Notes (Signed)
Pt requested additional note for work w/dates due to her job might require it.

## 2014-12-06 NOTE — Discharge Instructions (Signed)
Thank you for coming in today. Use mobic and tramadol as needed.   Follow up with your doctor.  Return as needed.   Acromioclavicular Separation with Rehab The acromioclavicular joint is the joint between the roof of the shoulder (acromion) and the collarbone (clavicle). It is vulnerable to injury. An acromioclavicular [facility]) separation is a partial or complete tear (sprain), injury, or redness and soreness (inflammation) of the ligaments that cross the acromioclavicular joint and hold it in place. There are two ligaments in this area that are vulnerable to injury, the acromioclavicular ligament and the coracoclavicular ligament. SYMPTOMS   Tenderness and swelling, or a bump on top of the shoulder (at the [facility] joint).  Bruising (contusion) in the area within 48 hours of injury.  Loss of strength or pain when reaching over the head or across the body. CAUSES  AC separation is caused by direct trauma to the joint (falling on your shoulder) or indirect trauma (falling on an outstretched arm). RISK INCREASES WITH:  Sports that require contact or collision, throwing sports (i.e. racquetball, squash).  Poor strength and flexibility.  Previous shoulder sprain or dislocation.  Poorly fitted or padded protective equipment. PREVENTION   Warm-up and stretch properly before activity.  Maintain physical fitness:  Shoulder strength.  Shoulder flexibility.  Cardiovascular fitness.  Wear properly fitted and padded protective equipment.  Learn and use proper technique when playing sports. Have a coach correct improper technique, including falling and landing.  Apply taping, protective strapping or padding, or an adhesive bandage as recommended before practice or competition. PROGNOSIS   If treated properly, the symptoms of AC separation can be expected to go away.  If treated improperly, permanent disability may occur unless surgery is performed.  Healing time varies with type of sport  and position, arm injured (dominant versus non-dominant) and severity of sprain. RELATED COMPLICATIONS  Weakness and fatigue of the arm or shoulder are possible but uncommon.  Pain and inflammation of the [facility] joint may continue.  Prolonged healing time may be necessary if usual activities are resumed too early. This causes a susceptibility to recurrent injury.  Prolonged disability may occur.  The shoulder may remain unstable or arthritic following repeated injury. TREATMENT  Treatment initially involves ice and medication to help reduce pain and inflammation. It may also be necessary to modify your activities in order to prevent further injury. Both non-surgical and surgical interventions exist to treat AC separation. Non-surgical intervention is usually recommended and involves wearing a sling to immobilize the joint for a period of time to allow for healing. Surgical intervention is usually only considered for severe sprains of the ligament or for individuals who do not improve after 2 to 6 months of non-surgical treatment. Surgical interventions require 4 to 6 months before a return to sports is possible. MEDICATION  If pain medication is necessary, nonsteroidal anti-inflammatory medications, such as aspirin and ibuprofen, or other minor pain relievers, such as acetaminophen, are often recommended.  Do not take pain medication for 7 days before surgery.  Prescription pain relievers may be given by your caregiver. Use only as directed and only as much as you need.  Ointments applied to the skin may be helpful.  Corticosteroid injections may be given to reduce inflammation. HEAT AND COLD  Cold treatment (icing) relieves pain and reduces inflammation. Cold treatment should be applied for 10 to 15 minutes every 2 to 3 hours for inflammation and pain and immediately after any activity that aggravates your symptoms. Use ice packs  or an ice massage.  Heat treatment may be used prior to  performing the stretching and strengthening activities prescribed by your caregiver, physical therapist or athletic trainer. Use a heat pack or a warm soak. SEEK IMMEDIATE MEDICAL CARE IF:   Pain, swelling or bruising worsens despite treatment.  There is pain, numbness or coldness in the arm.  Discoloration appears in the fingernails.  New, unexplained symptoms develop. EXERCISES  RANGE OF MOTION (ROM) AND STRETCHING EXERCISES - Acromioclavicular Separation These exercises may help you when beginning to rehabilitate your injury. Your symptoms may resolve with or without further involvement from your physician, physical therapist or athletic trainer. While completing these exercises, remember:  Restoring tissue flexibility helps normal motion to return to the joints. This allows healthier, less painful movement and activity.  An effective stretch should be held for at least 30 seconds.  A stretch should never be painful. You should only feel a gentle lengthening or release in the stretched tissue. ROM - Pendulum  Bend at the waist so that your right / left arm falls away from your body. Support yourself with your opposite hand on a solid surface, such as a table or a countertop.  Your right / left arm should be perpendicular to the ground. If it is not perpendicular, you need to lean over farther. Relax the muscles in your right / left arm and shoulder as much as possible.  Gently sway your hips and trunk so they move your right / left arm without any use of your right / left shoulder muscles.  Progress your movements so that your right / left arm moves side to side, then forward and backward, and finally, both clockwise and counterclockwise.  Complete __________ repetitions in each direction. Many people use this exercise to relieve discomfort in their shoulder as well as to gain range of motion. Repeat __________ times. Complete this exercise __________ times per day. STRETCH -  Flexion, Seated   Sit in a firm chair so that your right / left forearm can rest on a table or countertop. Your right / left elbow should rest below the height of your shoulder so that your shoulder feels supported and not tense or uncomfortable.  Keeping your right / left shoulder relaxed, lean forward at your waist, allowing your right / left hand to slide forward. Bend forward until you feel a moderate stretch in your shoulder, but before you feel an increase in your pain.  Hold __________ seconds. Slowly return to your starting position. Repeat __________ times. Complete this exercise __________ times per day. STRETCH - Flexion, Standing  Stand with good posture. With an underhand grip on your right / left and an overhand grip on the opposite hand, grasp a broomstick or cane so that your hands are a little more than shoulder-width apart.  Keeping your right / left elbow straight and shoulder muscles relaxed, push the stick with your opposite hand to raise your right / left arm in front of your body and then overhead. Raise your arm until you feel a stretch in your right / left shoulder, but before you have increased shoulder pain.  Try to avoid shrugging your right / left shoulder as your arm rises by keeping your shoulder blade tucked down and toward your mid-back spine. Hold __________ seconds.  Slowly return to the starting position. Repeat __________ times. Complete this exercise __________ times per day. STRENGTHENING EXERCISES - Acromioclavicular Separation These exercises may help you when beginning to rehabilitate your injury.  They may resolve your symptoms with or without further involvement from your physician, physical therapist or athletic trainer. While completing these exercises, remember:  Muscles can gain both the endurance and the strength needed for everyday activities through controlled exercises.  Complete these exercises as instructed by your physician, physical  therapist or athletic trainer. Progress the resistance and repetitions only as guided.  You may experience muscle soreness or fatigue, but the pain or discomfort you are trying to eliminate should never worsen during these exercises. If this pain does worsen, stop and make certain you are following the directions exactly. If the pain is still present after adjustments, discontinue the exercise until you can discuss the trouble with your clinician. STRENGTH - Shoulder Abductors, Isometric   With good posture, stand or sit about 4-6 inches from a wall with your right / left side facing the wall.  Bend your right / left elbow. Gently press your right / left elbow into the wall. Increase the pressure gradually until you are pressing as hard as you can without shrugging your shoulder or increasing any shoulder discomfort.  Hold __________ seconds.  Release the tension slowly. Relax your shoulder muscles completely before you start the next repetition. Repeat __________ times. Complete this exercise __________ times per day. STRENGTH - Internal Rotators, Isometric  Keep your right / left elbow at your side and bend it 90 degrees.  Step into a door frame so that the inside of your right / left wrist can press against the door frame without your upper arm leaving your side.  Gently press your right / left wrist into the door frame as if you were trying to draw the palm of your hand to your abdomen. Gradually increase the tension until you are pressing as hard as you can without shrugging your shoulder or increasing any shoulder discomfort.  Hold __________ seconds.  Release the tension slowly. Relax your shoulder muscles completely before you the next repetition. Repeat __________ times. Complete this exercise __________ times per day.  STRENGTH - External Rotators, Isometric  Keep your right / left elbow at your side and bend it 90 degrees.  Step into a door frame so that the outside of your  right / left wrist can press against the door frame without your upper arm leaving your side.  Gently press your right / left wrist into the door frame as if you were trying to swing the back of your hand away from your abdomen. Gradually increase the tension until you are pressing as hard as you can without shrugging your shoulder or increasing any shoulder discomfort.  Hold __________ seconds.  Release the tension slowly. Relax your shoulder muscles completely before you the next repetition. Repeat __________ times. Complete this exercise __________ times per day. STRENGTH - Internal Rotators  Secure a rubber exercise band/tubing to a fixed object so that it is at the same height as your right / left elbow when you are standing or sitting on a firm surface.  Stand or sit so that the secured exercise band/tubing is at your right / left side.  Bend your elbow 90 degrees. Place a folded towel or small pillow under your right / left arm so that your elbow is a few inches away from your side.  Keeping the tension on the exercise band/tubing, pull it across your body toward your abdomen. Be sure to keep your body steady so that the movement is only coming from your shoulder rotating.  Hold __________ seconds.  Release the tension in a controlled manner as you return to the starting position. Repeat __________ times. Complete this exercise __________ times per day. STRENGTH - External Rotators  Secure a rubber exercise band/tubing to a fixed object so that it is at the same height as your right / left elbow when you are standing or sitting on a firm surface.  Stand or sit so that the secured exercise band/tubing is at your side that is not injured.  Bend your elbow 90 degrees. Place a folded towel or small pillow under your right / left arm so that your elbow is a few inches away from your side.  Keeping the tension on the exercise band/tubing, pull it away from your body, as if pivoting on  your elbow. Be sure to keep your body steady so that the movement is only coming from your shoulder rotating.  Hold __________ seconds. Release the tension in a controlled manner as you return to the starting position. Repeat __________ times. Complete this exercise __________ times per day. Document Released: 06/16/2005 Document Revised: 09/08/2011 Document Reviewed: 09/28/2008 [facility] Patient Information 2015 [place], [place]. This information is not intended to replace advice given to you by your health care provider. Make sure you discuss any questions you have with your health care provider.

## 2014-12-15 ENCOUNTER — Encounter: Payer: Self-pay | Admitting: Family Medicine

## 2014-12-15 ENCOUNTER — Ambulatory Visit (INDEPENDENT_AMBULATORY_CARE_PROVIDER_SITE_OTHER): Admitting: Family Medicine

## 2014-12-15 VITALS — BP 126/70 | HR 78 | Temp 98.0°F | Resp 12 | Ht 67.0 in | Wt 208.0 lb

## 2014-12-15 DIAGNOSIS — S43101D Unspecified dislocation of right acromioclavicular joint, subsequent encounter: Secondary | ICD-10-CM | POA: Diagnosis not present

## 2014-12-15 DIAGNOSIS — S4991XD Unspecified injury of right shoulder and upper arm, subsequent encounter: Secondary | ICD-10-CM

## 2014-12-15 MED ORDER — METAXALONE 800 MG PO TABS: 800.0000 mg | ORAL_TABLET | Freq: Three times a day (TID) | ORAL | Status: DC
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
## 2014-12-15 NOTE — Patient Instructions (Signed)
Take 2 meloxicam - total 61m Take ultram for pain Call if not better in 1 week, then referral to orthopedics  F/U as needed

## 2014-12-22 ENCOUNTER — Encounter: Payer: Self-pay | Admitting: Family Medicine

## 2015-06-11 ENCOUNTER — Encounter: Payer: Self-pay | Admitting: Family Medicine

## 2015-06-11 DIAGNOSIS — M797 Fibromyalgia: Secondary | ICD-10-CM

## 2015-07-11 ENCOUNTER — Encounter: Payer: Self-pay | Admitting: Family Medicine

## 2015-07-13 IMAGING — DX DG CLAVICLE*R*
2 series · 2 of 2 positions shown · non-contrast
Comparison: None.

CLINICAL DATA: Right clavicle injury, pain, painful range of
motion. Initial encounter.

EXAM:
RIGHT CLAVICLE - 2+ VIEWS

[clavicle ap]
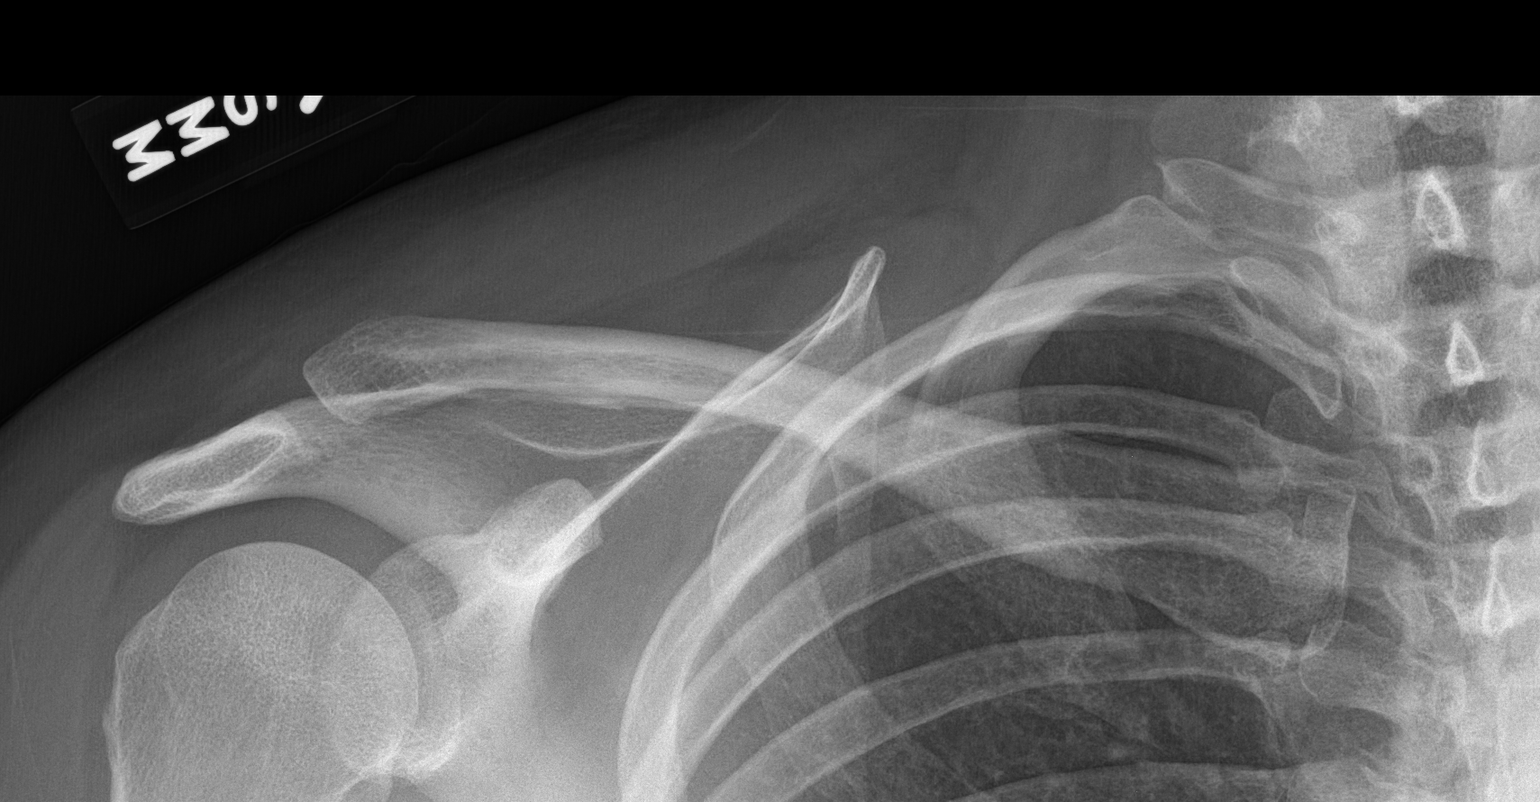

[clavicle axial]
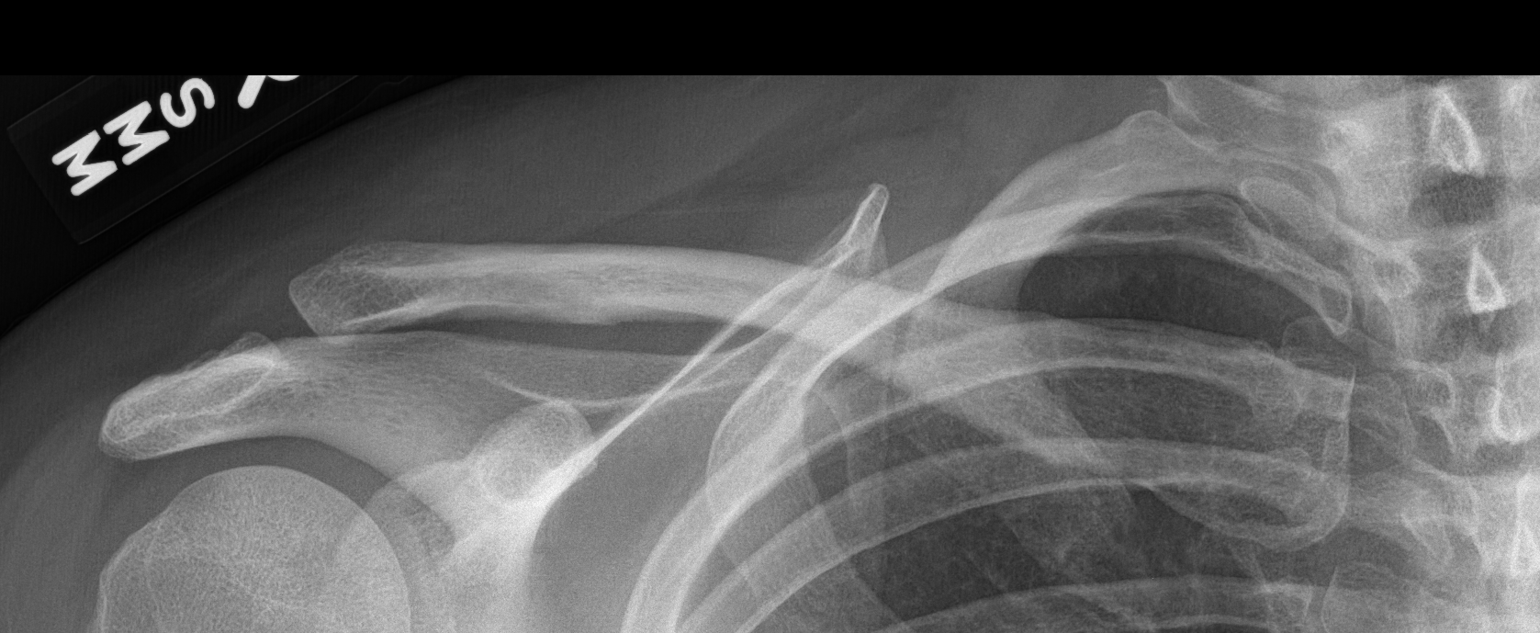

[2 of 2 positions shown; findings below may reference images not displayed]

FINDINGS: Right clavicle and right acromioclavicular joint are intact.
IMPRESSION: No acute findings.

## 2015-07-19 ENCOUNTER — Ambulatory Visit: Admitting: Physician Assistant

## 2015-07-21 ENCOUNTER — Emergency Department (HOSPITAL_COMMUNITY)
Admission: EM | Admit: 2015-07-21 | Discharge: 2015-07-21 | Disposition: A | Discharge status: Home / Self Care | Attending: Emergency Medicine | Admitting: Emergency Medicine

## 2015-07-21 ENCOUNTER — Emergency Department (HOSPITAL_COMMUNITY)

## 2015-07-21 ENCOUNTER — Encounter (HOSPITAL_COMMUNITY): Payer: Self-pay | Admitting: Emergency Medicine

## 2015-07-21 DIAGNOSIS — Z8739 Personal history of other diseases of the musculoskeletal system and connective tissue: Secondary | ICD-10-CM | POA: Insufficient documentation

## 2015-07-21 DIAGNOSIS — Z79899 Other long term (current) drug therapy: Secondary | ICD-10-CM | POA: Insufficient documentation

## 2015-07-21 DIAGNOSIS — Z8619 Personal history of other infectious and parasitic diseases: Secondary | ICD-10-CM | POA: Diagnosis not present

## 2015-07-21 DIAGNOSIS — R079 Chest pain, unspecified: Secondary | ICD-10-CM | POA: Insufficient documentation

## 2015-07-21 DIAGNOSIS — I10 Essential (primary) hypertension: Secondary | ICD-10-CM | POA: Diagnosis not present

## 2015-07-21 DIAGNOSIS — Z87891 Personal history of nicotine dependence: Secondary | ICD-10-CM | POA: Diagnosis not present

## 2015-07-21 LAB — BASIC METABOLIC PANEL
Anion gap: 7 (ref 5–15)
BUN: 17 mg/dL (ref 6–20)
CO2: 28 mmol/L (ref 22–32)
Calcium: 9.6 mg/dL (ref 8.9–10.3)
Chloride: 104 mmol/L (ref 101–111)
Creatinine, Ser: 0.9 mg/dL (ref 0.44–1.00)
GFR calc Af Amer: >60 mL/min (ref >60)
GFR calc non Af Amer: >60 mL/min (ref >60)
Glucose, Bld: 97 mg/dL (ref 65–99)
POTASSIUM: 3.7 mmol/L (ref 3.5–5.1)
SODIUM: 139 mmol/L (ref 135–145)

## 2015-07-21 LAB — CBC
HEMATOCRIT: 40.4 % (ref 36.0–46.0)
HEMOGLOBIN: 13.5 g/dL (ref 12.0–15.0)
MCH: 31.5 pg (ref 26.0–34.0)
MCHC: 33.4 g/dL (ref 30.0–36.0)
MCV: 94.2 fL (ref 78.0–100.0)
Platelets: 230 10*3/uL (ref 150–400)
RBC: 4.29 MIL/uL (ref 3.87–5.11)
RDW: 12.4 % (ref 11.5–15.5)
WBC: 6.4 10*3/uL (ref 4.0–10.5)

## 2015-07-21 LAB — TROPONIN I
Troponin I: <0.03 ng/mL (ref <0.031)
Troponin I: <0.03 ng/mL (ref <0.031)

## 2015-07-21 MED ORDER — ASPIRIN 81 MG PO CHEW: 81.0000 mg | CHEWABLE_TABLET | Freq: Every day | ORAL | Status: DC

## 2015-07-21 NOTE — Discharge Instructions (Signed)
Take Aspirin Daily - 5m  Please obtain all of your results from medical records or have your doctors office obtain the results - share them with your doctor - you should be seen at your doctors office in the next 2 days. Call today to arrange your follow up. Take the medications as prescribed. Please review all of the medicines and only take them if you do not have an allergy to them. Please be aware that if you are taking birth control pills, taking other prescriptions, ESPECIALLY ANTIBIOTICS may make the birth control ineffective - if this is the case, either do not engage in sexual activity or use alternative methods of birth control such as condoms until you have finished the medicine and your family doctor says it is OK to restart them. If you are on a blood thinner such as COUMADIN, be aware that any other medicine that you take may cause the coumadin to either work too much, or not enough - you should have your coumadin level rechecked in next 7 days if this is the case.  ?  It is also a possibility that you have an allergic reaction to any of the medicines that you have been prescribed - Everybody reacts differently to medications and while MOST people have no trouble with most medicines, you may have a reaction such as nausea, vomiting, rash, swelling, shortness of breath. If this is the case, please stop taking the medicine immediately and contact your physician.  ?  You should return to the ER if you develop severe or worsening symptoms.

## 2015-07-21 NOTE — ED Notes (Signed)
PT c/o central chest tightness with generalized weakness and SOB at times x1 week.

## 2015-07-21 NOTE — ED Provider Notes (Signed)
CSN: [id number]     Arrival date & time 07/21/15  1356 History   First MD Initiated Contact with Patient 07/21/15 1630     Chief Complaint  Patient presents with  . Chest Pain     (Consider location/radiation/quality/duration/timing/severity/associated sxs/prior Treatment) HPI Comments: The pt has hx of cp, admitted in 2014 during which time she had r/o and echo (normal) - she presents today with SOB X 6 days - it was acute in onset with some palpitations and nausea and weakness (gen), she has no fever but has some night sweats and chills, no URI sx, intermittent HA as well.  SOB is worse with exertion, no clots / PE (dimer neg 2 years ago), no other PE risk factors - has had bilateral calf swelling.  She never had stress when she was d/c from the hospital in 2014.  Patient is a 40 y.o. female presenting with chest pain. The history is provided by the patient.  Chest Pain   Past Medical History  Diagnosis Date  . Fibromyalgia   . Hypertension   . Rocky [place] spotted fever    Past Surgical History  Procedure Laterality Date  . Tubal ligation     Family History  Problem Relation Age of Onset  . COPD Mother   . Diabetes Father   . Hypertension Father    Social History  Substance Use Topics  . Smoking status: Former Smoker    Quit date: 02/14/2009  . Smokeless tobacco: Never Used  . Alcohol Use: 0.0 oz/week    0 Cans of beer per week     Comment: rarely    OB History    Gravida Para Term Preterm AB TAB SAB Ectopic Multiple Living            3     Review of Systems  Cardiovascular: Positive for chest pain.  All other systems reviewed and are negative.     Allergies  Review of patient's allergies indicates no known allergies.  Home Medications   Prior to Admission medications   Medication Sig Start Date End Date Taking? Authorizing Provider  ibuprofen (ADVIL,MOTRIN) 200 MG tablet Take 400 mg by mouth every 6 (six) hours as needed for mild pain or moderate  pain.    Yes Historical Provider, MD  metaxalone (SKELAXIN) 800 MG tablet Take 1 tablet (800 mg total) by mouth 3 (three) times daily. 12/15/14  Yes Alycia Rossetti, MD  traMADol (ULTRAM) 50 MG tablet Take 2 tablets (100 mg total) by mouth every 8 (eight) hours as needed. 10/23/14  Yes Alycia Rossetti, MD  aspirin 81 MG chewable tablet Chew 1 tablet (81 mg total) by mouth daily. 07/21/15   Noemi Chapel, MD  meloxicam (MOBIC) 7.5 MG tablet Take 7.5 mg by mouth daily as needed for pain (Aossicated with flares in foot).     Historical Provider, MD   BP 162/93 mmHg  Pulse 69  Temp(Src) 98.7 F (37.1 C) (Oral)  Resp 16  Ht 5' 7"  (1.702 m)  Wt 214 lb (97.07 kg)  BMI 33.51 kg/m2  SpO2 97%  LMP 06/21/2015 Physical Exam  Constitutional: She appears well-developed and well-nourished. No distress.  HENT:  Head: Normocephalic and atraumatic.  Mouth/Throat: Oropharynx is clear and moist. No oropharyngeal exudate.  Eyes: Conjunctivae and EOM are normal. Pupils are equal, round, and reactive to light. Right eye exhibits no discharge. Left eye exhibits no discharge. No scleral icterus.  Neck: Normal range of motion. Neck supple. No  JVD present. No thyromegaly present.  Cardiovascular: Normal rate, regular rhythm, normal heart sounds and intact distal pulses.  Exam reveals no gallop and no friction rub.   No murmur heard. Pulmonary/Chest: Effort normal and breath sounds normal. No respiratory distress. She has no wheezes. She has no rales.  Mild distant breath sounds.  Abdominal: Soft. Bowel sounds are normal. She exhibits no distension and no mass. There is no tenderness.  Musculoskeletal: Normal range of motion. She exhibits no edema ( no edema in LE's) or tenderness.  Lymphadenopathy:    She has no cervical adenopathy.  Neurological: She is alert. Coordination normal.  CN 3-12 intact, no drift,   Skin: Skin is warm and dry. No rash noted. No erythema.  Psychiatric: She has a normal mood and  affect. Her behavior is normal.  Nursing note and vitals reviewed.   ED Course  Procedures (including critical care time) Labs Review Labs Reviewed  BASIC METABOLIC PANEL  CBC  TROPONIN I  TROPONIN I    Imaging Review Dg Chest 2 View  07/21/2015  CLINICAL DATA:  40 year old female chest pain and shortness of breath for several days. Initial encounter. Former smoker. EXAM: CHEST  2 VIEW COMPARISON:  18 14 FINDINGS: Stable lung volumes. Chronic eventration of the diaphragm with adjacent stable appearing lower lobe curvilinear opacity suggesting chronic scarring. This might all be in both middle lobes given the appearance on the lateral view. No superimposed pneumothorax, pulmonary edema, pleural effusion or acute pulmonary opacity. No acute osseous abnormality identified. Normal cardiac size and mediastinal contours. Visualized tracheal air column is within normal limits. IMPRESSION: Stable chest with suspected chronic middle lobe scarring. No acute cardiopulmonary abnormality. Electronically Signed   By: Genevie Ann M.D.   On: 07/21/2015 14:42   I have personally reviewed and evaluated these images and lab results as part of my medical decision-making.   EKG Interpretation   Date/Time:  Saturday July 21 2015 14:01:51 EST Ventricular Rate:  64 PR Interval:  148 QRS Duration: 82 QT Interval:  400 QTC Calculation: 412 R Axis:   60 Text Interpretation:  Normal sinus rhythm with sinus arrhythmia Septal  infarct , age undetermined Abnormal ECG No significant change was found  Confirmed by Wyvonnia Dusky  MD, STEPHEN [phone]) on 07/21/2015 2:10:56 PM      EKG Interpretation  Date/Time:  Saturday July 21 2015 18:27:22 EST Ventricular Rate:  70 PR Interval:  147 QRS Duration: 92 QT Interval:  419 QTC Calculation: 452 R Axis:   59 Text Interpretation:  Sinus rhythm Low voltage, precordial leads Probable anteroseptal infarct, 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  Pt in agreement  intial labs normal, CXR without acute findings.  Second troponin normal, the patient is well-appearing, stable for discharge, etiology of the patient's symptoms are unclear whether she does not appear to have acute finding suggestive of ischemic process.  She has been informed of the indications for return and the need for close follow-up both  with her family doctor as well as for a stress test, her family doctor can arrange this.  In addition to written d/c instructions, the pt was given verbal d/c instructions including the indications for return and expressed understanding to the instructions.  ASA daily - 81 mg      Noemi Chapel, MD 07/21/15 1836

## 2015-07-23 ENCOUNTER — Encounter: Payer: Self-pay | Admitting: Family Medicine

## 2015-07-24 ENCOUNTER — Ambulatory Visit (INDEPENDENT_AMBULATORY_CARE_PROVIDER_SITE_OTHER): Admitting: Family Medicine

## 2015-07-24 ENCOUNTER — Encounter: Payer: Self-pay | Admitting: Family Medicine

## 2015-07-24 VITALS — BP 128/78 | HR 68 | Temp 99.2°F | Resp 14 | Ht 67.0 in | Wt 220.0 lb

## 2015-07-24 DIAGNOSIS — R079 Chest pain, unspecified: Secondary | ICD-10-CM

## 2015-07-24 DIAGNOSIS — N3080 Other cystitis without hematuria: Secondary | ICD-10-CM

## 2015-07-24 DIAGNOSIS — R5382 Chronic fatigue, unspecified: Secondary | ICD-10-CM | POA: Diagnosis not present

## 2015-07-24 DIAGNOSIS — R002 Palpitations: Secondary | ICD-10-CM

## 2015-07-24 DIAGNOSIS — N3090 Cystitis, unspecified without hematuria: Secondary | ICD-10-CM

## 2015-07-24 LAB — TSH: TSH: 2.163 u[IU]/mL (ref 0.350–4.500)

## 2015-07-24 NOTE — Patient Instructions (Signed)
We will call with lab results  Urology referral  REferral to cardiology F/U pending results

## 2015-07-24 NOTE — Progress Notes (Signed)
Patient ID: Sharon Merritt, female   DOB: 06/02/1976, 40 y.o.   MRN: [id number]   Subjective:    Patient ID: Sharon Merritt, female    DOB: August 17, 1975, 40 y.o.   MRN: [id number]  Patient presents for ER F/U   she here for hospital follow-up. She was seen in the ER after she had 4-5 days of worsening chest tightness shortness of breath palpitations she then began having headaches and felt fatigued more than her typical. She went to the emergency room after her work nurse noted that her heart rhythm seemed a little off. Chest x-ray EKG telemetry troponins were unremarkable. She was advised to have stress testing done. She does not have any family history of any coronary artery disease that she is aware of. She also has underlying fibromyalgia as she was not sure if this was contributing to some of her symptoms.  Since she has left the hospital she still feels like she gets a fluttering in her chest on and off she is not sure what is actually bringing on the symptoms. Denies any increasing caffeine intake she has not had any new medications  She they requested that she be tested for Lyme disease in [facility] spotted fever as she had a tick pulled off for her a few months ago she is always fatigued. She also requests a referral to urology as she has had issues with her bladder on and off where she has been worked up for bladder infections was typically come back negative by for her fibromyalgia flares she seems to have darkened urine and irritation in her bladder.   Review Of Systems:  GEN- + fatigue, fever, weight loss,weakness, recent illness HEENT- denies eye drainage, change in vision, nasal discharge, CVS- denies chest pain,+ palpitations RESP- denies SOB, cough, wheeze ABD- denies N/V, change in stools, abd pain GU- denies dysuria, hematuria, dribbling, incontinence MSK- + joint pain,+ muscle aches, injury Neuro- denies headache, dizziness, syncope, seizure activity       Objective:     BP 128/78 mmHg  Pulse 68  Temp(Src) 99.2 F (37.3 C) (Oral)  Resp 14  Ht 5' 7"  (1.702 m)  Wt 220 lb (99.791 kg)  BMI 34.45 kg/m2  LMP 06/21/2015 GEN- NAD, alert and oriented x3 HEENT- PERRL, EOMI, non injected sclera, pink conjunctiva, MMM, oropharynx clear Neck- Supple, no thyromegaly CVS- RRR, no murmur RESP-CTAB ABD-NABS,soft,NT,ND EXT- No edema Pulses- Radial,- 2+        Assessment & Plan:      Problem List Items Addressed This Visit    Fatigue - Primary Doubt related to chronic Lyme or other tick borne illness, but will draw titers, expressed this to patient   Relevant Orders   Rocky mtn spotted fvr ab, IgG-blood   Rocky mtn spotted fvr ab, IgM-blood   B. Burgdorfi Antibodies   TSH    Other Visit Diagnoses    Chest pain, unspecified chest pain type        Concern for the palpitationa and chest pain, possible arrythmia we are not picking up.  Before our monitor placed on her today she is going back to work tomorrow and she had a lot of her symptoms while she was at work.    Recurrent cystitis        Not typically bacterial related, send to urology for work up of IC       Note: This dictation was prepared with Diplomatic Services operational officer dictation along with smaller phrase technology. Any transcriptional errors that  result from this process are unintentional.

## 2015-07-25 LAB — LYME AB/WESTERN BLOT REFLEX: B burgdorferi Ab IgG+IgM: 0.83 {ISR}

## 2015-07-26 LAB — ROCKY MTN SPOTTED FVR AB, IGM-BLOOD: ROCKY MTN SPOTTED FEVER, IGM: 0.28 IV

## 2015-07-26 LAB — ROCKY MTN SPOTTED FVR AB, IGG-BLOOD: RMSF IgG: 0.32 IV

## 2015-07-31 ENCOUNTER — Telehealth: Payer: Self-pay | Admitting: Family Medicine

## 2015-07-31 NOTE — Telephone Encounter (Signed)
Call patient her Holter monitor showed PVCs and a few other extra beats causing the palpitations. She has an appointment to see cardiology I'm not can add anything at this time we will fax over her report to them she may need a longer monitor done

## 2015-07-31 NOTE — Telephone Encounter (Signed)
Call placed to patient and patient made aware.   Holter results faxed to Dr. Domenic Polite.

## 2015-08-13 ENCOUNTER — Ambulatory Visit (INDEPENDENT_AMBULATORY_CARE_PROVIDER_SITE_OTHER): Admitting: Physician Assistant

## 2015-08-13 ENCOUNTER — Encounter: Payer: Self-pay | Admitting: Physician Assistant

## 2015-08-13 ENCOUNTER — Other Ambulatory Visit: Payer: Self-pay | Admitting: Physician Assistant

## 2015-08-13 ENCOUNTER — Encounter: Payer: Self-pay | Admitting: Family Medicine

## 2015-08-13 VITALS — BP 124/76 | HR 76 | Temp 98.4°F | Resp 18 | Wt 220.0 lb

## 2015-08-13 DIAGNOSIS — J029 Acute pharyngitis, unspecified: Secondary | ICD-10-CM | POA: Diagnosis not present

## 2015-08-13 DIAGNOSIS — J209 Acute bronchitis, unspecified: Secondary | ICD-10-CM | POA: Diagnosis not present

## 2015-08-13 DIAGNOSIS — J988 Other specified respiratory disorders: Secondary | ICD-10-CM

## 2015-08-13 DIAGNOSIS — B9689 Other specified bacterial agents as the cause of diseases classified elsewhere: Secondary | ICD-10-CM

## 2015-08-13 DIAGNOSIS — R509 Fever, unspecified: Secondary | ICD-10-CM

## 2015-08-13 LAB — STREP GROUP A AG, W/REFLEX TO CULT: STREGTOCOCCUS GROUP A AG SCREEN: NOT DETECTED

## 2015-08-13 LAB — INFLUENZA A AND B AG, IMMUNOASSAY
Influenza A Antigen: NOT DETECTED
Influenza B Antigen: NOT DETECTED

## 2015-08-13 MED ORDER — PREDNISONE 20 MG PO TABS: ORAL_TABLET | ORAL | Status: DC

## 2015-08-13 MED ORDER — ALBUTEROL SULFATE HFA 108 (90 BASE) MCG/ACT IN AERS: 2.0000 | INHALATION_SPRAY | Freq: Four times a day (QID) | RESPIRATORY_TRACT | Status: DC | PRN

## 2015-08-13 MED ORDER — AZITHROMYCIN 250 MG PO TABS: ORAL_TABLET | ORAL | Status: DC

## 2015-08-13 NOTE — Progress Notes (Signed)
Patient ID: Benelli Winther MRN: [id number], DOB: [birth year], 40 y.o. Date of Encounter: 08/13/2015, 11:41 AM    Chief Complaint:  Chief Complaint  Patient presents with  . sick x 3 days    head/chest congestion, hurts to breath, chills     HPI: 40 y.o. year old white female says that she's been sick several days but is progressively worsening. Says that her nose has been stuffy and congested. She is able to blow a little bit from her nose. Also has a cough and says that her chest hurts when she coughs. says that she has been able to get up some phlegm. has not checked her temperature. No significant sore throat. No known fevers. States that she quit smoking 6 years ago. No known history of asthma. She works in [employer]. Left work early yesterday and called in sick today.     Home Meds:   Outpatient Prescriptions Prior to Visit  Medication Sig Dispense Refill  . aspirin 81 MG chewable tablet Chew 1 tablet (81 mg total) by mouth daily. 30 tablet 1  . ibuprofen (ADVIL,MOTRIN) 200 MG tablet Take 400 mg by mouth every 6 (six) hours as needed for mild pain or moderate pain.     . meloxicam (MOBIC) 7.5 MG tablet Take 7.5 mg by mouth daily as needed for pain (Aossicated with flares in foot).     . metaxalone (SKELAXIN) 800 MG tablet Take 1 tablet (800 mg total) by mouth 3 (three) times daily. 90 tablet 2  . traMADol (ULTRAM) 50 MG tablet Take 2 tablets (100 mg total) by mouth every 8 (eight) hours as needed. 180 tablet 2   No facility-administered medications prior to visit.    Allergies: No Known Allergies    Review of Systems: See HPI for pertinent ROS. All other ROS negative.    Physical Exam: Blood pressure 124/76, pulse 76, temperature 98.4 F (36.9 C), temperature source Oral, resp. rate 18, weight 220 lb (99.791 kg), last menstrual period 06/21/2015., Body mass index is 34.45 kg/(m^2). General:  WF. Appears in no acute distress. HEENT: Normocephalic, atraumatic, eyes  without discharge, sclera non-icteric, nares are without discharge. Bilateral auditory canals clear, TM's are without perforation, pearly grey and translucent with reflective cone of light bilaterally. Oral cavity moist, posterior pharynx without exudate, erythema, peritonsillar abscess. No tenderness with percussion to frontal or maxillary sinuses bilaterally.  Neck: Supple. No thyromegaly. No lymphadenopathy. Lungs: Clear bilaterally to auscultation without wheezes, rales, or rhonchi. Breathing is unlabored. Heart: Regular rhythm. No murmurs, rubs, or gallops. Msk:  Strength and tone normal for age. Extremities/Skin: Warm and dry. Neuro: Alert and oriented X 3. Moves all extremities spontaneously. Gait is normal. CNII-XII grossly in tact. Psych:  Responds to questions appropriately with a normal affect.     ASSESSMENT AND PLAN:  40 y.o. year old female with  1. Acute bronchitis, unspecified organism Rapid strep test and influenza test are negative. Reviewed with her each of these medications and proper use/directions for using them. Note given for out of work today and tomorrow. Then she is already off Wednesday Thursday. Follow-up if symptoms worsen or if symptoms do not resolve within 1 week after completion of antibiotic. - azithromycin (ZITHROMAX) 250 MG tablet; Day 1: Take 2 daily. Days 2-5: Take 1 daily.  Dispense: 6 tablet; Refill: 0 - predniSONE (DELTASONE) 20 MG tablet; 2 daily for 3 days then 1 daily for 2 days  Dispense: 8 tablet; Refill: 0 - albuterol (PROVENTIL HFA;VENTOLIN HFA)  108 (90 Base) MCG/ACT inhaler; Inhale 2 puffs into the lungs every 6 (six) hours as needed for wheezing or shortness of breath.  Dispense: 1 Inhaler; Refill: 0  2. Bacterial respiratory infection - azithromycin (ZITHROMAX) 250 MG tablet; Day 1: Take 2 daily. Days 2-5: Take 1 daily.  Dispense: 6 tablet; Refill: 0  3. Sorethroat - Influenza a and b - STREP GROUP A AG, W/REFLEX TO CULT  4. Chills with  fever - Influenza a and b - STREP GROUP A AG, W/REFLEX TO CULT   Signed, [address] [place], [place], [facility] 08/13/2015 11:41 AM

## 2015-08-15 LAB — CULTURE, GROUP A STREP: ORGANISM ID, BACTERIA: NORMAL

## 2015-08-17 ENCOUNTER — Ambulatory Visit (INDEPENDENT_AMBULATORY_CARE_PROVIDER_SITE_OTHER): Admitting: Cardiology

## 2015-08-17 ENCOUNTER — Encounter: Payer: Self-pay | Admitting: Cardiology

## 2015-08-17 VITALS — BP 112/68 | HR 69 | Ht 67.0 in | Wt 218.0 lb

## 2015-08-17 DIAGNOSIS — Z8679 Personal history of other diseases of the circulatory system: Secondary | ICD-10-CM | POA: Diagnosis not present

## 2015-08-17 DIAGNOSIS — R002 Palpitations: Secondary | ICD-10-CM

## 2015-08-17 DIAGNOSIS — R072 Precordial pain: Secondary | ICD-10-CM | POA: Diagnosis not present

## 2015-08-17 DIAGNOSIS — R9431 Abnormal electrocardiogram [ECG] [EKG]: Secondary | ICD-10-CM | POA: Diagnosis not present

## 2015-08-17 DIAGNOSIS — M797 Fibromyalgia: Secondary | ICD-10-CM | POA: Diagnosis not present

## 2015-08-17 NOTE — Progress Notes (Signed)
Cardiology Office Note  Date: 08/17/2015   ID: Sharon Merritt, DOB [birth year], MRN [id number]  PCP: Karis Juba, PA-C  Referring provider: Vic Blackbird, MD Consulting Cardiologist: Rozann Lesches, MD   Chief Complaint  Patient presents with  . Palpitations  . Chest Pain    History of Present Illness: Sharon Merritt is a 40 y.o. female referred for cardiology consultation by Dr. Buelah Manis. She is here today with a family member. She tells me that she has been experiencing intermittent chest discomfort and palpitations since 2014. This has waxed and waned, she cannot identify any particular precipitant or alleviating factors. She also has fibromyalgia and has considered that this might be giving her some symptoms. There is no exertional component to her chest pain. She describes it both as a sharp sensation at times and also as a tightness. She has some lightheadedness. Describes a brief feeling of heart skipping. She has not had syncope.  Patient was seen in the ER in January with chest pain and palpitations. Troponin I levels were negative. She was told by ER physician that she needed a stress test. She was also concerned because she was told that her ECG was abnormal and showed possibly an "old problem with the heart."  I personally reviewed the recent 24-hour Holter monitor obtained by Dr. Buelah Manis. Rhythm was sinus throughout with heart rate ranging from 54 bpm up to 119 bpm, average heart rate 80 bpm. There were rare PVCs and PACs. Lead motion artifact also noted and misinterpreted by technician as ectopy. There were no sustained arrhythmias or pauses.   Also reviewed recent and old ECGs, showed them to the patient as well. She has had sinus rhythm consistently documented and does have poor R-wave progression, doubt that this represents an old anteroseptal infarct however, particularly with her echocardiogram from 2014 showing normal LVEF and no wall motion abnormalities.  Past  Medical History  Diagnosis Date  . Fibromyalgia   . Essential hypertension   . Rocky Mountain spotted fever     Past Surgical History  Procedure Laterality Date  . Tubal ligation      Current Outpatient Prescriptions  Medication Sig Dispense Refill  . albuterol (PROVENTIL HFA;VENTOLIN HFA) 108 (90 Base) MCG/ACT inhaler Inhale 2 puffs into the lungs every 6 (six) hours as needed for wheezing or shortness of breath. 1 Inhaler 0  . aspirin 81 MG chewable tablet Chew 1 tablet (81 mg total) by mouth daily. 30 tablet 1  . ibuprofen (ADVIL,MOTRIN) 200 MG tablet Take 400 mg by mouth every 6 (six) hours as needed for mild pain or moderate pain.     . metaxalone (SKELAXIN) 800 MG tablet Take 1 tablet (800 mg total) by mouth 3 (three) times daily. 90 tablet 2  . traMADol (ULTRAM) 50 MG tablet Take 2 tablets (100 mg total) by mouth every 8 (eight) hours as needed. 180 tablet 2  . meloxicam (MOBIC) 7.5 MG tablet Take 7.5 mg by mouth daily as needed for pain (Aossicated with flares in foot). Reported on 08/17/2015     No current facility-administered medications for this visit.   Allergies:  Review of patient's allergies indicates no known allergies.   Social History: The patient  reports that she quit smoking about 6 years ago. Her smoking use included Cigarettes. She has never used smokeless tobacco. She reports that she drinks alcohol. She reports that she does not use illicit drugs.   Family History: The patient's family history includes COPD in her mother;  Diabetes in her father; Hypertension in her father.   ROS:  Please see the history of present illness. Otherwise, complete review of systems is positive for fatigue, recent URI.  All other systems are reviewed and negative.   Physical Exam: VS:  BP 112/68 mmHg  Pulse 69  Ht 5' 7"  (1.702 m)  Wt 218 lb (98.884 kg)  BMI 34.14 kg/m2  SpO2 93%  LMP 06/21/2015, BMI Body mass index is 34.14 kg/(m^2).  Wt Readings from Last 3 Encounters:    08/17/15 218 lb (98.884 kg)  08/13/15 220 lb (99.791 kg)  07/24/15 220 lb (99.791 kg)    General: Overweight woman, appears comfortable at rest. HEENT: Conjunctiva and lids normal, oropharynx clear. Neck: Supple, no elevated JVP or carotid bruits, no thyromegaly. Lungs: Clear to auscultation, nonlabored breathing at rest. Cardiac: Regular rate and rhythm, no S3 or significant systolic murmur, no pericardial rub. Abdomen: Soft, nontender, no hepatomegaly, bowel sounds present, no guarding or rebound. Extremities: No pitting edema, distal pulses 2+. Skin: Warm and dry. Musculoskeletal: No kyphosis. Neuropsychiatric: Alert and oriented x3, affect grossly appropriate.  ECG: I personally reviewed the recent tracing from 07/23/2015 which showed normal sinus rhythm with decreased R wave progression.  Recent Labwork: 10/09/2014: ALT 18; AST 20 07/21/2015: BUN 17; Creatinine, Ser 0.90; Hemoglobin 13.5; Platelets 230; Potassium 3.7; Sodium 139 07/24/2015: TSH 2.163     Component Value Date/Time   CHOL 170 02/15/2013 0340   TRIG 134 02/15/2013 0340   HDL 43 02/15/2013 0340   CHOLHDL 4.0 02/15/2013 0340   VLDL 27 02/15/2013 0340   LDLCALC 100* 02/15/2013 0340    Other Studies Reviewed Today:  Echocardiogram 02/15/2013:  Study Conclusions  - Left ventricle: The cavity size was normal. Wall thickness was normal. Systolic function was normal. The estimated ejection fraction was in the range of 55% to 60%. Although no diagnostic regional wall motion abnormality was identified, this possibility cannot be completely excluded on the basis of this study. Left ventricular diastolic function parameters were normal. - Mitral valve: Trivial regurgitation. - Right atrium: Central venous pressure: 64m Hg (est). - Pulmonary arteries: Systolic pressure could not be accurately estimated. - Pericardium, extracardiac: A prominent pericardial fat pad was present. Impressions:  - No  prior study for comparison. Findings as noted above.  Chest x-ray 07/21/2015: FINDINGS: Stable lung volumes. Chronic eventration of the diaphragm with adjacent stable appearing lower lobe curvilinear opacity suggesting chronic scarring. This might all be in both middle lobes given the appearance on the lateral view. No superimposed pneumothorax, pulmonary edema, pleural effusion or acute pulmonary opacity. No acute osseous abnormality identified. Normal cardiac size and mediastinal contours. Visualized tracheal air column is within normal limits.  IMPRESSION: Stable chest with suspected chronic middle lobe scarring. No acute cardiopulmonary abnormality.  Assessment and Plan:  1. Recurrent atypical chest pain as well as palpitations. No syncope. Baseline ECG is nonspecific, cardiac markers were normal during recent ER visit, she had documentation of normal LVEF as of 2014 without wall motion abnormalities, and recent cardiac monitor showed sinus rhythm with rare PACs and PVCs. No arrhythmias documented. She tells me that she did not feel any particular symptoms while she was wearing her monitor. She does have a history of hypertension. Also prior history of tobacco use. She is very concerned about her heart, and stress testing has been recommended to her by ER physician and also her primary care provider. We will arrange an exercise echocardiogram for further evaluation.  2. History of  hypertension based on chart review. She is currently not on any antihypertensive medications and her blood pressure is normal.  3. Fibromyalgia, she also reports discomfort related to this diagnosis.  4. Palpitations. No arrhythmias documented. She has rare PACs and PVCs by recent cardiac monitor.  Current medicines were reviewed with the patient today.   Orders Placed This Encounter  Procedures  . Echo stress    Disposition: Call with results.   Signed, Satira Sark, MD, [facility] 08/17/2015  9:08 AM    [place] [facility] [address], [place], [place] [address] Phone: [phone]; Fax: [phone]

## 2015-08-17 NOTE — Patient Instructions (Signed)
Your physician recommends that you schedule a follow-up appointment in: to be determined   .Your physician has requested that you have a stress echocardiogram. For further information please visit [email or web address]. Please follow instruction sheet as given.     Your physician recommends that you continue on your current medications as directed. Please refer to the Current Medication list given to you today.    If you need a refill on your cardiac medications before your next appointment, please call your pharmacy.     Thank you for choosing [facility] !

## 2015-08-30 ENCOUNTER — Telehealth: Payer: Self-pay | Admitting: Physician Assistant

## 2015-08-30 NOTE — Telephone Encounter (Signed)
FMLA forms routed on 08/30/2015.

## 2015-08-31 NOTE — Telephone Encounter (Signed)
Pt called back stating she is needing FMLA for her Fibromyalgia for when she is out of work and can not perform her duties.  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  Her duties are attached on back of FMLA forms  Pt is aware of 20 dollar form fee.  Forms have been placed on provider desk for review

## 2015-08-31 NOTE — Telephone Encounter (Signed)
lmtrc

## 2015-09-03 ENCOUNTER — Encounter: Payer: Self-pay | Admitting: *Deleted

## 2015-09-03 NOTE — Telephone Encounter (Signed)
Pt returned call and stated that she has not missed work but that her hours have been extended to 10 hrs day, she has to be out of work for dr appts and therapy appts, states is gettiing worse, was told by nurse at her work these papers needed to be filled out in order to cover her from losing job.

## 2015-09-03 NOTE — Telephone Encounter (Signed)
I discussed this with Dr. Buelah Manis. Form has been given to Dr. Buelah Manis. She will f/u up with this.

## 2015-09-03 NOTE — Telephone Encounter (Signed)
Call placed to patient. [place].

## 2015-09-03 NOTE — Telephone Encounter (Signed)
I can not give her FMLA for fibromyaglia If she missed due to the cardiology visits, we can justify these, I need specific dates If she has upcoming appt with cardiology I can write this in, but no general FMLA for fibromyalgia

## 2015-09-03 NOTE — Telephone Encounter (Signed)
Call pt FMLA is not justified for Fibromyalgia.  See what the issue is, the form states this was from missing work a few months back??

## 2015-09-04 NOTE — Telephone Encounter (Signed)
This encounter was created in error - please disregard.

## 2015-09-04 NOTE — Telephone Encounter (Signed)
lmtrc to pt

## 2015-09-04 NOTE — Telephone Encounter (Signed)
Pt wants to disregard FMLA ppw since can not give for [facility]

## 2015-09-10 ENCOUNTER — Ambulatory Visit (HOSPITAL_COMMUNITY)
Admission: RE | Admit: 2015-09-10 | Discharge: 2015-09-10 | Disposition: A | Discharge status: Home / Self Care | Source: Ambulatory Visit | Attending: Cardiology | Admitting: Cardiology

## 2015-09-10 DIAGNOSIS — R072 Precordial pain: Secondary | ICD-10-CM | POA: Diagnosis present

## 2015-09-10 DIAGNOSIS — R9431 Abnormal electrocardiogram [ECG] [EKG]: Secondary | ICD-10-CM | POA: Diagnosis not present

## 2015-09-10 LAB — ECHOCARDIOGRAM STRESS TEST
CHL CUP MPHR: 179 {beats}/min
CHL CUP RESTING HR STRESS: 71 {beats}/min
CSEPHR: 92 %
Estimated workload: 9 METS
Exercise duration (min): 7 min
Exercise duration (sec): 1 s
Peak HR: 166 {beats}/min
RPE: 15

## 2015-09-18 ENCOUNTER — Ambulatory Visit (INDEPENDENT_AMBULATORY_CARE_PROVIDER_SITE_OTHER): Admitting: Urology

## 2015-09-18 DIAGNOSIS — N941 Unspecified dyspareunia: Secondary | ICD-10-CM | POA: Diagnosis not present

## 2015-09-18 DIAGNOSIS — R3915 Urgency of urination: Secondary | ICD-10-CM

## 2015-09-18 DIAGNOSIS — R300 Dysuria: Secondary | ICD-10-CM | POA: Diagnosis not present

## 2015-09-18 DIAGNOSIS — R350 Frequency of micturition: Secondary | ICD-10-CM | POA: Diagnosis not present

## 2016-06-06 ENCOUNTER — Other Ambulatory Visit: Payer: Self-pay | Admitting: Physician Assistant

## 2016-06-06 DIAGNOSIS — J209 Acute bronchitis, unspecified: Secondary | ICD-10-CM

## 2016-06-06 MED ORDER — ALBUTEROL SULFATE HFA 108 (90 BASE) MCG/ACT IN AERS: 2.0000 | INHALATION_SPRAY | Freq: Four times a day (QID) | RESPIRATORY_TRACT | 0 refills | Status: DC | PRN

## 2016-08-21 ENCOUNTER — Encounter: Payer: Self-pay | Admitting: Physician Assistant

## 2016-08-21 ENCOUNTER — Ambulatory Visit (INDEPENDENT_AMBULATORY_CARE_PROVIDER_SITE_OTHER): Admitting: Physician Assistant

## 2016-08-21 VITALS — BP 110/70 | HR 75 | Temp 98.1°F | Resp 18 | Wt 217.8 lb

## 2016-08-21 DIAGNOSIS — J111 Influenza due to unidentified influenza virus with other respiratory manifestations: Secondary | ICD-10-CM

## 2016-08-21 MED ORDER — PREDNISONE 20 MG PO TABS: 20.0000 mg | ORAL_TABLET | Freq: Every day | ORAL | 0 refills | Status: DC

## 2016-08-21 NOTE — Progress Notes (Signed)
Patient ID: Sharon Merritt MRN: [id number], DOB: July 07, 1975, 41 y.o. Date of Encounter: 08/21/2016, 9:19 AM    Chief Complaint:  Chief Complaint  Patient presents with  . Headache  . Cough     HPI: 41 y.o. year old female reports that she has had headache, cough, sneezing, and some sore throat. Says that it started Sunday 08/17/16. Spent Monday 08/18/16 in bed. Says it "feels like it's settling in my chest and I'm sweating". Says that she feels like she loses her breath at times when she is talking and when she is lying flat in bed. She uses her albuterol inhaler but that doesn't help a lot. Says that her husband was diagnosed with flu and bronchitis and his symptoms started Saturday.     Home Meds:   Outpatient Medications Prior to Visit  Medication Sig Dispense Refill  . ibuprofen (ADVIL,MOTRIN) 200 MG tablet Take 400 mg by mouth every 6 (six) hours as needed for mild pain or moderate pain.     Marland Kitchen albuterol (PROVENTIL HFA;VENTOLIN HFA) 108 (90 Base) MCG/ACT inhaler Inhale 2 puffs into the lungs every 6 (six) hours as needed for wheezing or shortness of breath. 1 Inhaler 0  . aspirin 81 MG chewable tablet Chew 1 tablet (81 mg total) by mouth daily. (Patient not taking: Reported on 08/21/2016) 30 tablet 1  . meloxicam (MOBIC) 7.5 MG tablet Take 7.5 mg by mouth daily as needed for pain (Aossicated with flares in foot). Reported on 08/17/2015    . metaxalone (SKELAXIN) 800 MG tablet Take 1 tablet (800 mg total) by mouth 3 (three) times daily. (Patient not taking: Reported on 08/21/2016) 90 tablet 2  . traMADol (ULTRAM) 50 MG tablet Take 2 tablets (100 mg total) by mouth every 8 (eight) hours as needed. (Patient not taking: Reported on 08/21/2016) 180 tablet 2   No facility-administered medications prior to visit.     Allergies: No Known Allergies    Review of Systems: See HPI for pertinent ROS. All other ROS negative.    Physical Exam: Blood pressure 110/70, pulse 75, temperature 98.1  F (36.7 C), temperature source Oral, resp. rate 18, weight 217 lb 12.8 oz (98.8 kg), last menstrual period 08/07/2016, SpO2 98 %., Body mass index is 34.11 kg/m. General:  Obese WF. Appears in no acute distress. HEENT: Normocephalic, atraumatic, eyes without discharge, sclera non-icteric, nares are without discharge. Bilateral auditory canals clear, TM's are without perforation, pearly grey and translucent with reflective cone of light bilaterally. Oral cavity moist, posterior pharynx without exudate, erythema, peritonsillar abscess.  Neck: Supple. No thyromegaly. No lymphadenopathy. Lungs: Clear bilaterally to auscultation without wheezes, rales, or rhonchi. Breathing is unlabored. Clear. No wheezes. Heart: Regular rhythm. No murmurs, rubs, or gallops. Msk:  Strength and tone normal for age. Extremities/Skin: Warm and dry. Neuro: Alert and oriented X 3. Moves all extremities spontaneously. Gait is normal. CNII-XII grossly in tact. Psych:  Responds to questions appropriately with a normal affect.     ASSESSMENT AND PLAN:  41 y.o. year old female with  1. Influenza Our lab has no further flu tests available. Clinically suspect that she has the flu. She is out of the window of Tamiflu being effective. At this point needs symptomatic management while wait for this to run its course and resolve. Recommend Mucinex DM as expectorant. Drink lots of clear fluids. Will add prednisone as she feels that her chest is "tight and wheezing at times "and can continue the albuterol as needed. There  is no wheezing on exam and breath sounds-- good- so will just use low-dose prednisone. She is to follow-up if symptoms worsen or if they are not resolving after 7 days. - predniSONE (DELTASONE) 20 MG tablet; Take 1 tablet (20 mg total) by mouth daily with breakfast.  Dispense: 5 tablet; Refill: 0   Signed, [address] [place], [place], [facility] 08/21/2016 9:19 AM

## 2016-10-01 ENCOUNTER — Other Ambulatory Visit: Payer: Self-pay | Admitting: Obstetrics and Gynecology

## 2016-10-01 DIAGNOSIS — N6452 Nipple discharge: Secondary | ICD-10-CM

## 2016-10-06 ENCOUNTER — Other Ambulatory Visit

## 2016-10-06 ENCOUNTER — Encounter

## 2016-10-14 DIAGNOSIS — N83202 Unspecified ovarian cyst, left side: Secondary | ICD-10-CM | POA: Insufficient documentation

## 2016-10-14 DIAGNOSIS — N939 Abnormal uterine and vaginal bleeding, unspecified: Secondary | ICD-10-CM | POA: Insufficient documentation

## 2016-11-04 DIAGNOSIS — E669 Obesity, unspecified: Secondary | ICD-10-CM | POA: Insufficient documentation

## 2016-11-04 DIAGNOSIS — E66812 Obesity, class 2: Secondary | ICD-10-CM | POA: Insufficient documentation

## 2016-11-04 DIAGNOSIS — B2709 Gammaherpesviral mononucleosis with other complications: Secondary | ICD-10-CM | POA: Insufficient documentation

## 2016-12-18 ENCOUNTER — Ambulatory Visit (INDEPENDENT_AMBULATORY_CARE_PROVIDER_SITE_OTHER): Payer: Self-pay | Admitting: Physician Assistant

## 2016-12-18 ENCOUNTER — Encounter: Payer: Self-pay | Admitting: Physician Assistant

## 2016-12-18 VITALS — BP 110/84 | HR 94 | Temp 97.4°F | Resp 16 | Ht 67.0 in | Wt 231.0 lb

## 2016-12-18 DIAGNOSIS — R41840 Attention and concentration deficit: Secondary | ICD-10-CM

## 2016-12-18 DIAGNOSIS — R202 Paresthesia of skin: Secondary | ICD-10-CM

## 2016-12-18 DIAGNOSIS — R413 Other amnesia: Secondary | ICD-10-CM

## 2016-12-18 DIAGNOSIS — R253 Fasciculation: Secondary | ICD-10-CM

## 2016-12-18 DIAGNOSIS — H539 Unspecified visual disturbance: Secondary | ICD-10-CM

## 2016-12-18 NOTE — Progress Notes (Signed)
Patient ID: Sharon Merritt MRN: [id number], DOB: [birth year], 41 y.o. Date of Encounter: 12/18/2016, 8:35 AM    Chief Complaint:  Chief Complaint  Patient presents with  . neurological symptoms    has tested positive for lyme disease     HPI: 41 y.o. year old female presents for above.   She brings in 2 sets of extensive lab work. One was performed by Dr. Moshe Cipro in [place] in 09/2015. The second set was performed by integrative doctors in [facility].  Patient states that she talked to the health department multiple times and they determined that she was CDC positive for Lyme disease.  She states that Dr. Moshe Cipro had her on 2 antibiotics for several months and treated her for mold toxicity and round worms and Giardia.  She states that the integrative doctors checked levels to monitor the mold and lyme. 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 they treated her with multiple supplements to boost her immunity.   Says that she is tired all the time. Can't think straight. Short-term memory loss. Can be doing something that she does all the time and all of a sudden has no memory of what to do. Sometimes has problems walking. At times her spine feels like it is on fire. Says it comes and goes. Says that all of these symptoms will flare and then will decrease. Says that she is having tingling. Has muscle spasms. Part of her face feels numb at times. Her scalp has a crawling sensation. She will have muscle jerks throughout her body.  She says that all of these symptoms started around 2013 - 2014.  Says that one time she was evaluated for possible Parkinson's because of her tremor. She thinks that the Lyme may have been present well before it was diagnosed on labs above.  Says at times her vision is blurry and other times it is okay says that the vision changes comes and goes as  well.  She gets teary-eyed talking about all of this and this is causing her to feel depressed.       Home Meds:   Outpatient Medications Prior to Visit  Medication Sig Dispense Refill  . albuterol (PROVENTIL HFA;VENTOLIN HFA) 108 (90 Base) MCG/ACT inhaler Inhale 2 puffs into the lungs every 6 (six) hours as needed for wheezing or shortness of breath. 1 Inhaler 0  . ibuprofen (ADVIL,MOTRIN) 200 MG tablet Take 400 mg by mouth every 6 (six) hours as needed for mild pain or moderate pain.     . metaxalone (SKELAXIN) 800 MG tablet Take 1 tablet (800 mg total) by mouth 3 (three) times daily. 90 tablet 2  . traMADol (ULTRAM) 50 MG tablet Take 2 tablets (100 mg total) by mouth every 8 (eight) hours as needed. 180 tablet 2  . aspirin 81 MG chewable tablet Chew 1 tablet (81 mg total) by mouth daily. (Patient not taking: Reported on 08/21/2016) 30 tablet 1  . meloxicam (MOBIC) 7.5 MG tablet Take 7.5 mg by mouth daily as needed for pain (Aossicated with flares in foot). Reported on 08/17/2015    . predniSONE (DELTASONE) 20 MG tablet Take 1 tablet (20 mg total) by mouth daily with breakfast. 5 tablet 0   No facility-administered medications prior to visit.     Allergies: No Known Allergies    Review of Systems: See HPI for pertinent ROS. All other ROS  negative.    Physical Exam: Blood pressure 110/84, pulse 94, temperature 97.4 F (36.3 C), temperature source Oral, resp. rate 16, height 5' 7"  (1.702 m), weight 231 lb (104.8 kg), last menstrual period 12/10/2016, SpO2 94 %., Body mass index is 36.18 kg/m. General:  WNWD WF. Appears in no acute distress. Neck: Supple. No thyromegaly. No lymphadenopathy. Lungs: Clear bilaterally to auscultation without wheezes, rales, or rhonchi. Breathing is unlabored. Heart: Regular rhythm. No murmurs, rubs, or gallops. Msk:  Strength and tone normal for age. Extremities/Skin: Warm and dry. Neuro: Alert and oriented X 3. Moves all extremities spontaneously.  Gait is normal. CNII-XII grossly in tact. Psych:  Responds to questions appropriately with a normal affect.     ASSESSMENT AND PLAN:  41 y.o. year old female with  1. Lack of concentration - Ambulatory referral to Neurology  2. Short-term memory loss - Ambulatory referral to Neurology  3. Paresthesias - Ambulatory referral to Neurology  4. Muscle twitching - Ambulatory referral to Neurology  5. Vision changes - Ambulatory referral to Neurology  Will refer to neurology. She will follow-up with them.  [address] [place], [place], [facility] 12/18/2016 8:35 AM

## 2017-02-09 ENCOUNTER — Other Ambulatory Visit: Payer: Self-pay | Admitting: Neurology

## 2017-02-09 DIAGNOSIS — G35 Multiple sclerosis: Secondary | ICD-10-CM

## 2017-02-13 ENCOUNTER — Other Ambulatory Visit (HOSPITAL_BASED_OUTPATIENT_CLINIC_OR_DEPARTMENT_OTHER): Payer: Self-pay

## 2017-02-13 DIAGNOSIS — G4733 Obstructive sleep apnea (adult) (pediatric): Secondary | ICD-10-CM

## 2017-02-17 ENCOUNTER — Other Ambulatory Visit: Payer: Self-pay | Admitting: Neurology

## 2017-02-17 DIAGNOSIS — G35 Multiple sclerosis: Secondary | ICD-10-CM

## 2017-02-18 ENCOUNTER — Ambulatory Visit (HOSPITAL_COMMUNITY)
Admission: RE | Admit: 2017-02-18 | Discharge: 2017-02-18 | Disposition: A | Discharge status: Home / Self Care | Source: Ambulatory Visit | Attending: Neurology | Admitting: Neurology

## 2017-02-18 DIAGNOSIS — R9082 White matter disease, unspecified: Secondary | ICD-10-CM | POA: Insufficient documentation

## 2017-02-18 DIAGNOSIS — G35 Multiple sclerosis: Secondary | ICD-10-CM | POA: Insufficient documentation

## 2017-02-18 DIAGNOSIS — R9089 Other abnormal findings on diagnostic imaging of central nervous system: Secondary | ICD-10-CM | POA: Insufficient documentation

## 2017-02-18 MED ORDER — GADOBENATE DIMEGLUMINE 529 MG/ML IV SOLN
20.0000 mL | Freq: Once | INTRAVENOUS | Status: AC | PRN
  Administered 2017-02-18: 20 mL via INTRAVENOUS

## 2017-03-04 ENCOUNTER — Ambulatory Visit: Attending: Neurology | Admitting: Neurology

## 2017-03-04 DIAGNOSIS — G4733 Obstructive sleep apnea (adult) (pediatric): Secondary | ICD-10-CM | POA: Insufficient documentation

## 2017-03-07 NOTE — Procedures (Signed)
   Sasakwa A. Merlene Laughter, MD     [email or web address]             NOCTURNAL POLYSOMNOGRAPHY   LOCATION: [facility]   Patient Name: Sharon, Merritt Date: 03/04/2017 Gender: Female D.O.B: [birth year] Age (years): 22 Referring Provider: Phillips Odor MD, ABSM Height (inches): 67 Interpreting Physician: Phillips Odor MD, ABSM Weight (lbs): 227 RPSGT: Peak, Robert BMI: 36 MRN: [id number] Neck Size: 18.00 CLINICAL INFORMATION Sleep Study Type: NPSG  Indication for sleep study: N/A  Epworth Sleepiness Score: 9  SLEEP STUDY TECHNIQUE As per the AASM Manual for the Scoring of Sleep and Associated Events v2.3 (April 2016) with a hypopnea requiring 4% desaturations.  The channels recorded and monitored were frontal, central and occipital EEG, electrooculogram (EOG), submentalis EMG (chin), nasal and oral airflow, thoracic and abdominal wall motion, anterior tibialis EMG, snore microphone, electrocardiogram, and pulse oximetry.  MEDICATIONS Medications self-administered by patient taken the night of the study : N/A  Current Outpatient Prescriptions:  .  albuterol (PROVENTIL HFA;VENTOLIN HFA) 108 (90 Base) MCG/ACT inhaler, Inhale 2 puffs into the lungs every 6 (six) hours as needed for wheezing or shortness of breath., Disp: 1 Inhaler, Rfl: 0 .  ibuprofen (ADVIL,MOTRIN) 200 MG tablet, Take 400 mg by mouth every 6 (six) hours as needed for mild pain or moderate pain. , Disp: , Rfl:  .  metaxalone (SKELAXIN) 800 MG tablet, Take 1 tablet (800 mg total) by mouth 3 (three) times daily., Disp: 90 tablet, Rfl: 2 .  traMADol (ULTRAM) 50 MG tablet, Take 2 tablets (100 mg total) by mouth every 8 (eight) hours as needed., Disp: 180 tablet, Rfl: 2 .  valACYclovir (VALTREX) 1000 MG tablet, Take 1,000 mg by mouth daily., Disp: , Rfl:    SLEEP ARCHITECTURE The study was initiated at 10:31:06 PM and ended at 4:53:02 AM.  Sleep onset time was 23.9 minutes and the sleep  efficiency was 82.6%. The total sleep time was 315.5 minutes.  Stage REM latency was 216.5 minutes.  The patient spent 4.12% of the night in stage N1 sleep, 66.24% in stage N2 sleep, 14.58% in stage N3 and 15.06% in REM.  Alpha intrusion was absent.  Supine sleep was 7.15%.  RESPIRATORY PARAMETERS The overall apnea/hypopnea index (AHI) was 0.0 per hour. There were 0 total apneas, including 0 obstructive, 0 central and 0 mixed apneas. There were 0 hypopneas and 2 RERAs.  The AHI during Stage REM sleep was 0.0 per hour.  AHI while supine was 0.0 per hour.  The mean oxygen saturation was 92.77%. The minimum SpO2 during sleep was 90.00%.  snoring was noted during this study.  CARDIAC DATA The 2 lead EKG demonstrated sinus rhythm. The mean heart rate was 43.09 beats per minute. Other EKG findings include: None. LEG MOVEMENT DATA The total PLMS were 64 with a resulting PLMS index of 12.17. Associated arousal with leg movement index was 0.4.  IMPRESSIONS - No significant obstructive sleep apnea occurred during this study. - No significant central sleep apnea occurred during this study. - Mild periodic limb movements of sleep occurred during the study. No significant associated arousals.  Delano Metz, MD Diplomate, American Board of Sleep Medicine. ELECTRONICALLY SIGNED ON:  03/07/2017, 2:16 PM [place] PH: [phone]   FX: [phone] [place]

## 2017-03-19 ENCOUNTER — Ambulatory Visit

## 2017-03-19 ENCOUNTER — Ambulatory Visit
Admission: RE | Admit: 2017-03-19 | Discharge: 2017-03-19 | Disposition: A | Discharge status: Home / Self Care | Source: Ambulatory Visit | Attending: Obstetrics and Gynecology | Admitting: Obstetrics and Gynecology

## 2017-03-19 DIAGNOSIS — N6452 Nipple discharge: Secondary | ICD-10-CM

## 2017-03-19 HISTORY — DX: Nipple discharge: N64.52

## 2018-10-22 ENCOUNTER — Other Ambulatory Visit: Payer: Self-pay

## 2018-10-22 ENCOUNTER — Ambulatory Visit: Payer: Self-pay | Admitting: Family Medicine

## 2018-10-25 ENCOUNTER — Other Ambulatory Visit: Payer: Self-pay

## 2018-10-25 ENCOUNTER — Ambulatory Visit (INDEPENDENT_AMBULATORY_CARE_PROVIDER_SITE_OTHER): Payer: Self-pay | Admitting: Family Medicine

## 2018-10-25 ENCOUNTER — Encounter: Payer: Self-pay | Admitting: Family Medicine

## 2018-10-25 DIAGNOSIS — F5104 Psychophysiologic insomnia: Secondary | ICD-10-CM

## 2018-10-25 DIAGNOSIS — F331 Major depressive disorder, recurrent, moderate: Secondary | ICD-10-CM

## 2018-10-25 DIAGNOSIS — F329 Major depressive disorder, single episode, unspecified: Secondary | ICD-10-CM | POA: Insufficient documentation

## 2018-10-25 DIAGNOSIS — F411 Generalized anxiety disorder: Secondary | ICD-10-CM

## 2018-10-25 MED ORDER — LORAZEPAM 0.5 MG PO TABS: 0.5000 mg | ORAL_TABLET | Freq: Every evening | ORAL | 0 refills | Status: DC | PRN

## 2018-10-25 MED ORDER — FLUOXETINE HCL 20 MG PO CAPS: 20.0000 mg | ORAL_CAPSULE | Freq: Every day | ORAL | 3 refills | Status: DC

## 2018-10-25 NOTE — Progress Notes (Signed)
Virtual Visit via Telephone Note  I connected with [employer] on 10/25/18 at 11:27am  by telephone and verified that I am speaking with the correct person using two identifiers.  Pt location: At home  Physician location: In office, [facility], Vic Blackbird MD    I discussed the limitations, risks, security and privacy concerns of performing an evaluation and management service by telephone and the availability of in person appointments. I also discussed with the patient that there may be a patient responsible charge related to this service. The patient expressed understanding and agreed to proceed.   History of Present Illness:  Last seen in our clinic- in June 2018  She has seen [place]- past 2 years - has albuterol inhaler often when she gets stressed it comes on, so she really thinks it is anxiety - has valtrex for HSV type 1, and EBV history- on as needed   She admits she has felt very down, tearful and crying out of no where, some days she has   she is working-postal service  Has been going on for years, first bout in middle school she does have some issues that happened during childhood but states that nothing is happened in the past years that she pinpoint to cause her symptoms.  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  She was on that for a while she states then came off.  Years back her previous PCP put her on Lexapro, Zoloft and Ambien she always felt like she was in a follow-up with these medication so she stopped them.  She has never been hospitalized for anxiety or depression.  She has problems with sleep sometimes she feels like she just cannot turn her mind off she can feel her heart beating at times.  She is fatigued during the day.  She is also had episodes where she has felt panicky where her heart beats fast during the day feels like she cannot breathe like something bad is going to  happen.  She states that she is put off getting help for quite some time.  She did try to call behavioral health she does need referral would like to do psychotherapy in conjunction with medications.  She denies any suicidal ideations.   Observations/Objective: Unable to   Assessment and Plan: MDD- start prozac 60m once a day, will give ativan  0.583m she can take 1/2 to 1 whole tablet at bedtime for sleep and as a brige Referral to psychiatry, she will benefit from intensive psychotherapy as well.  I think there is been some issues in her past which she has not faced directly.  She is in agreement with therapy.  Follow Up Instructions:    I discussed the assessment and treatment plan with the patient. The patient was provided an opportunity to ask questions and all were answered. The patient agreed with the plan and demonstrated an understanding of the instructions.   The patient was advised to call back or seek an in-person evaluation if the symptoms worsen or if the condition fails to improve as anticipated.  I provided 16  minutes of non-face-to-face time during this encounter. End time:11:43 am  KaVic BlackbirdMD

## 2018-11-10 ENCOUNTER — Telehealth: Payer: Self-pay | Admitting: Family Medicine

## 2018-11-10 NOTE — Telephone Encounter (Signed)
Per MD, schedule patient for nest week.   Call placed to patient and patient made aware.

## 2018-11-10 NOTE — Telephone Encounter (Signed)
Patient thought she was supposed to get phone call today for a visit, however I do not see this anywhere so I think maybe there was some confusion, patient would like to know if she needs to see dr Buelah Manis before or after she goes to behavioral health [phone]

## 2018-11-17 ENCOUNTER — Ambulatory Visit: Payer: Self-pay | Admitting: Family Medicine

## 2018-11-24 ENCOUNTER — Ambulatory Visit (INDEPENDENT_AMBULATORY_CARE_PROVIDER_SITE_OTHER): Admitting: Family Medicine

## 2018-11-24 ENCOUNTER — Other Ambulatory Visit: Payer: Self-pay

## 2018-11-24 VITALS — BP 110/66 | HR 100 | Temp 98.2°F | Resp 18 | Ht 67.0 in | Wt 226.2 lb

## 2018-11-24 DIAGNOSIS — M797 Fibromyalgia: Secondary | ICD-10-CM | POA: Diagnosis not present

## 2018-11-24 DIAGNOSIS — F411 Generalized anxiety disorder: Secondary | ICD-10-CM | POA: Diagnosis not present

## 2018-11-24 DIAGNOSIS — F331 Major depressive disorder, recurrent, moderate: Secondary | ICD-10-CM

## 2018-11-24 DIAGNOSIS — F5104 Psychophysiologic insomnia: Secondary | ICD-10-CM

## 2018-11-24 DIAGNOSIS — R5382 Chronic fatigue, unspecified: Secondary | ICD-10-CM

## 2018-11-24 MED ORDER — FLUOXETINE HCL 40 MG PO CAPS: 40.0000 mg | ORAL_CAPSULE | Freq: Every day | ORAL | 2 refills | Status: DC

## 2018-11-24 MED ORDER — LORAZEPAM 0.5 MG PO TABS: 0.5000 mg | ORAL_TABLET | Freq: Two times a day (BID) | ORAL | 0 refills | Status: DC | PRN

## 2018-11-24 NOTE — Progress Notes (Signed)
Subjective:    Patient ID: Sharon Merritt, female    DOB: 11-26-1975, 43 y.o.   MRN: [id number]  Patient presents for Follow-up (meds)  Patient here to follow-up medications.  She had a phone note about 1 month ago.  She had increased depression symptoms fatigue felt panicky at times.  She was started on Prozac 20 mg and also given Lorazepam 0.5 mg as a bridge.  She been referred to psychiatry and her plan was for intensive psychotherapy. Her first appointment with psychiatry Dr. Modesta Messing is on June 16 She has severe depression/anxiety epsidoes every few months, states she just goes up and down all year  Takes ativan before bedtime, but still does not sleep well  She has been getting outside/ trying to relax   Has anxiety and depression on both sides of the family, her sister was diagnosed bipolar disorder in the past and has uncle with bipolar  No extra stressors that she feels triggers her episodes  Has good marriage, health has been okay , financially no issues  First bout of depression was 9th grade.   Often feels like she gets side tracked very easily Often right herself notes to keep on track at work Functioning well at work.   PHQ 9 score 18, GAD 7 score 18   Rare use of albuterrol Has Valtrex prn- has reactivated EBV     She is followed by GYN for Endmetriosis/adenomyosis of utures   Was followed by [facility], they did labs on her in the fall She has chronic fagtiue Fibromyalgia- long standing seen by neurology as well in past , has also had sleep study  Review Of Systems:  GEN- + chronic fatigue, fever, weight loss,weakness, recent illness HEENT- denies eye drainage, change in vision, nasal discharge, CVS- denies chest pain, palpitations RESP- denies SOB, cough, wheeze ABD- denies N/V, change in stools, abd pain GU- denies dysuria, hematuria, dribbling, incontinence MSK- denies joint pain,+ muscle aches, injury Neuro- denies headache, dizziness,  syncope, seizure activity       Objective:    BP 110/66   Pulse 100   Temp 98.2 F (36.8 C)   Resp 18   Ht 5' 7"  (1.702 m)   Wt 226 lb 3.2 oz (102.6 kg)   SpO2 98%   BMI 35.43 kg/m  GEN- NAD, alert and oriented x3 HEENT- PERRL, EOMI, non injected sclera, pink conjunctiva, MMM, oropharynx clear Neck- Supple, no thyromegaly CVS- RRR, no murmur RESP-CTAB Psych- depressed affect, not anxious, good eye contact, well groomed, no psychomotor agitation noted EXT- No edema Pulses- Radial 2+        Assessment & Plan:      Problem List Items Addressed This Visit      Unprioritized   Chronic insomnia   Fatigue   Fibromyalgia   Relevant Medications   FLUoxetine (PROZAC) 40 MG capsule   GAD (generalized anxiety disorder)   Relevant Medications   FLUoxetine (PROZAC) 40 MG capsule   LORazepam (ATIVAN) 0.5 MG tablet   MDD (major depressive disorder) - Primary    Recurrent depression/anxiety episodes always feels like she is up and down, I do query possible bipolar II- depressed in her based on family history but defer to psychiatry She takes prozac at bedtime since her mind races so much Increase to 53m, continue ativan for adjunct for now  Psychiatry appt in 2 weeks  Obtain labs from her integrative physician  Discussed movement such as yoga to help with fibromyalgia  Relevant Medications   FLUoxetine (PROZAC) 40 MG capsule   LORazepam (ATIVAN) 0.5 MG tablet      Note: This dictation was prepared with Dragon dictation along with smaller phrase technology. Any transcriptional errors that result from this process are unintentional.

## 2018-11-24 NOTE — Patient Instructions (Addendum)
Release of records- [place] increased to 109m once a day  F/U 3 months for Physical

## 2018-11-25 ENCOUNTER — Encounter: Payer: Self-pay | Admitting: Family Medicine

## 2018-11-25 NOTE — Assessment & Plan Note (Signed)
Recurrent depression/anxiety episodes always feels like she is up and down, I do query possible bipolar II- depressed in her based on family history but defer to psychiatry She takes prozac at bedtime since her mind races so much Increase to 45m, continue ativan for adjunct for now  Psychiatry appt in 2 weeks  Obtain labs from her integrative physician  Discussed movement such as yoga to help with fibromyalgia
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
## 2018-12-14 ENCOUNTER — Encounter (HOSPITAL_COMMUNITY): Payer: Self-pay | Admitting: Psychiatry

## 2018-12-14 ENCOUNTER — Ambulatory Visit (INDEPENDENT_AMBULATORY_CARE_PROVIDER_SITE_OTHER): Admitting: Psychiatry

## 2018-12-14 ENCOUNTER — Other Ambulatory Visit: Payer: Self-pay

## 2018-12-14 DIAGNOSIS — F331 Major depressive disorder, recurrent, moderate: Secondary | ICD-10-CM | POA: Diagnosis not present

## 2018-12-14 NOTE — Patient Instructions (Signed)
1. Continue fluoxetine 40 mg daily  2. Continue ativan 0.5 mg daily as needed for anxiety 3. Referral to therapy  4. Next appointment: 7/16 at 3 PM
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
## 2019-01-13 ENCOUNTER — Ambulatory Visit (INDEPENDENT_AMBULATORY_CARE_PROVIDER_SITE_OTHER): Admitting: Psychiatry

## 2019-01-13 ENCOUNTER — Encounter (HOSPITAL_COMMUNITY): Payer: Self-pay | Admitting: Psychiatry

## 2019-01-13 ENCOUNTER — Other Ambulatory Visit: Payer: Self-pay

## 2019-01-13 DIAGNOSIS — F331 Major depressive disorder, recurrent, moderate: Secondary | ICD-10-CM

## 2019-01-13 MED ORDER — FLUOXETINE HCL 20 MG PO CAPS: ORAL_CAPSULE | ORAL | 1 refills | Status: DC

## 2019-01-13 MED ORDER — TRAZODONE HCL 50 MG PO TABS: ORAL_TABLET | ORAL | 1 refills | Status: DC

## 2019-01-13 NOTE — Patient Instructions (Signed)
1. Increase fluoxetine 60 mg daily (40 mg + 20 mg) 2. Start Trazodone 25-50 mg at night as needed for insomnia 3. Continue ativan 0.5 mg daily as needed for anxiety 4. Referral to therapy  5. Next appointment: 8/20 at 2:30

## 2019-02-01 ENCOUNTER — Other Ambulatory Visit: Payer: Self-pay | Admitting: Family Medicine

## 2019-02-03 ENCOUNTER — Emergency Department (HOSPITAL_COMMUNITY)
Admission: EM | Admit: 2019-02-03 | Discharge: 2019-02-03 | Disposition: A | Discharge status: Home / Self Care | Attending: Emergency Medicine | Admitting: Emergency Medicine

## 2019-02-03 ENCOUNTER — Emergency Department (HOSPITAL_COMMUNITY)

## 2019-02-03 ENCOUNTER — Encounter (HOSPITAL_COMMUNITY): Payer: Self-pay | Admitting: Emergency Medicine

## 2019-02-03 ENCOUNTER — Other Ambulatory Visit: Payer: Self-pay

## 2019-02-03 DIAGNOSIS — R0789 Other chest pain: Secondary | ICD-10-CM | POA: Diagnosis present

## 2019-02-03 DIAGNOSIS — I10 Essential (primary) hypertension: Secondary | ICD-10-CM | POA: Insufficient documentation

## 2019-02-03 DIAGNOSIS — Z79899 Other long term (current) drug therapy: Secondary | ICD-10-CM | POA: Insufficient documentation

## 2019-02-03 DIAGNOSIS — Z87891 Personal history of nicotine dependence: Secondary | ICD-10-CM | POA: Insufficient documentation

## 2019-02-03 LAB — BASIC METABOLIC PANEL
Anion gap: 8 (ref 5–15)
BUN: 13 mg/dL (ref 6–20)
CO2: 25 mmol/L (ref 22–32)
Calcium: 8.9 mg/dL (ref 8.9–10.3)
Chloride: 102 mmol/L (ref 98–111)
Creatinine, Ser: 0.91 mg/dL (ref 0.44–1.00)
GFR calc Af Amer: >60 mL/min (ref >60)
GFR calc non Af Amer: >60 mL/min (ref >60)
Glucose, Bld: 89 mg/dL (ref 70–99)
Potassium: 3.6 mmol/L (ref 3.5–5.1)
Sodium: 135 mmol/L (ref 135–145)

## 2019-02-03 LAB — DIFFERENTIAL
Abs Immature Granulocytes: 0.02 10*3/uL (ref 0.00–0.07)
Basophils Absolute: 0 10*3/uL (ref 0.0–0.1)
Basophils Relative: 1 %
Eosinophils Absolute: 0.1 10*3/uL (ref 0.0–0.5)
Eosinophils Relative: 1 %
Immature Granulocytes: 0 %
Lymphocytes Relative: 29 %
Lymphs Abs: 2.1 10*3/uL (ref 0.7–4.0)
Monocytes Absolute: 0.8 10*3/uL (ref 0.1–1.0)
Monocytes Relative: 10 %
Neutro Abs: 4.3 10*3/uL (ref 1.7–7.7)
Neutrophils Relative %: 59 %

## 2019-02-03 LAB — HEPATIC FUNCTION PANEL
ALT: 18 U/L (ref 0–44)
AST: 22 U/L (ref 15–41)
Albumin: 3.8 g/dL (ref 3.5–5.0)
Alkaline Phosphatase: 69 U/L (ref 38–126)
Bilirubin, Direct: 0.1 mg/dL (ref 0.0–0.2)
Indirect Bilirubin: 0.5 mg/dL (ref 0.3–0.9)
Total Bilirubin: 0.6 mg/dL (ref 0.3–1.2)
Total Protein: 7 g/dL (ref 6.5–8.1)

## 2019-02-03 LAB — TROPONIN I (HIGH SENSITIVITY)
Troponin I (High Sensitivity): 2 ng/L (ref <18)
Troponin I (High Sensitivity): 3 ng/L (ref <18)

## 2019-02-03 LAB — CBC
HCT: 38.8 % (ref 36.0–46.0)
Hemoglobin: 12.9 g/dL (ref 12.0–15.0)
MCH: 31.5 pg (ref 26.0–34.0)
MCHC: 33.2 g/dL (ref 30.0–36.0)
MCV: 94.9 fL (ref 80.0–100.0)
Platelets: 229 10*3/uL (ref 150–400)
RBC: 4.09 MIL/uL (ref 3.87–5.11)
RDW: 12.8 % (ref 11.5–15.5)
WBC: 7.6 10*3/uL (ref 4.0–10.5)
nRBC: 0 % (ref 0.0–0.2)

## 2019-02-03 LAB — LIPASE, BLOOD: Lipase: 27 U/L (ref 11–51)

## 2019-02-03 MED ORDER — ASPIRIN 81 MG PO CHEW
324.0000 mg | CHEWABLE_TABLET | Freq: Once | ORAL | Status: AC
  Administered 2019-02-03: 324 mg via ORAL
  Filled 2019-02-03: qty 4

## 2019-02-03 NOTE — ED Triage Notes (Signed)
Pt c/o of left sided cp that radiates to left arm and left side of neck.

## 2019-02-03 NOTE — Discharge Instructions (Addendum)
Take 1 baby aspirin a day and follow-up with Dr. Bronson Ing or 1 of his partners in cardiology here in [place] next week.  Call tomorrow to make an appointment for next week.  Return sooner if problems

## 2019-02-03 NOTE — ED Provider Notes (Signed)
[facility] EMERGENCY DEPARTMENT Provider Note   CSN: [id number] Arrival date & time: 02/03/19  1710     History   Chief Complaint Chief Complaint  Patient presents with  . Chest Pain    HPI Sharon Merritt is a 43 y.o. female.     Patient complains of chest pain off and on.  The pain is not related to exertion.  The history is provided by the patient. No language interpreter was used.  Chest Pain Pain location:  L chest Pain quality: aching   Pain radiates to:  Does not radiate Pain severity:  Mild Onset quality:  Sudden Timing:  Sporadic Progression:  Unchanged Chronicity:  New Context: not breathing   Relieved by:  Nothing Worsened by:  Nothing Ineffective treatments:  None tried Associated symptoms: no abdominal pain, no back pain, no cough, no fatigue and no headache     Past Medical History:  Diagnosis Date  . Breast discharge   . Essential hypertension   . Fibromyalgia   . [place] spotted fever     Patient Active Problem List   Diagnosis Date Noted  . MDD (major depressive disorder) 10/25/2018  . GAD (generalized anxiety disorder) 10/25/2018  . Chronic insomnia 10/25/2018  . DDD (degenerative disc disease), lumbar 10/09/2014  . Fatigue 10/09/2014  . Abnormal involuntary movement 02/17/2013  . Fibromyalgia   . Hypertension   . [facility] spotted fever   . Chest tightness 02/14/2013  . Obesity 02/14/2013  . GERD (gastroesophageal reflux disease) 02/14/2013    Past Surgical History:  Procedure Laterality Date  . TUBAL LIGATION       OB History    Gravida      Para      Term      Preterm      AB      Living  3     SAB      TAB      Ectopic      Multiple      Live Births               Home Medications    Prior to Admission medications   Medication Sig Start Date End Date Taking? Authorizing Provider  albuterol (PROVENTIL HFA;VENTOLIN HFA) 108 (90 Base) MCG/ACT inhaler Inhale 2 puffs into the lungs every 6  (six) hours as needed for wheezing or shortness of breath. 06/06/16  Yes Orlena Sheldon, PA-C  FLUoxetine (PROZAC) 20 MG capsule 60 mg daily (40 mg + 20 mg ) Patient taking differently: Take 20 mg by mouth daily. Patient takes this combined with the 64m daily for a total dose of 60 mg (40 mg + 20 mg ) 01/13/19  Yes Hisada, Reina, MD  FLUoxetine (PROZAC) 40 MG capsule TAKE 1 CAPSULE ORALLY DAILY. Patient taking differently: Take 40 mg by mouth daily. Patient takes this combined with the 210mdaily for a total dose of 60 mg (40 mg + 20 mg ) 02/02/19  Yes [place], KaModena NunneryMD  LORazepam (ATIVAN) 0.5 MG tablet Take 1 tablet (0.5 mg total) by mouth 2 (two) times daily as needed for anxiety. 11/24/18  Yes [place], KaModena NunneryMD  traZODone (DESYREL) 50 MG tablet 25-50 mg at night as needed for sleep Patient taking differently: Take 25-50 mg by mouth at bedtime as needed for sleep.  01/13/19  Yes Hisada, ReElie GoodyMD  valACYclovir (VALTREX) 1000 MG tablet Take 1,000 mg by mouth daily as needed.    Yes  [provider]    Family History Family History  Problem Relation Age of Onset  . COPD Mother   . Depression Mother   . Diabetes Father   . Hypertension Father   . Bipolar disorder Sister   . Post-traumatic stress disorder Sister   . Alcohol abuse Sister   . Drug abuse Sister   . Bipolar disorder Cousin   . Schizophrenia Cousin     Social History Social History   Tobacco Use  . Smoking status: Former Smoker    Types: Cigarettes    Quit date: 02/14/2009    Years since quitting: 9.9  . Smokeless tobacco: Never Used  Substance Use Topics  . Alcohol use: Yes    Alcohol/week: 0.0 standard drinks    Comment: Rarely   . Drug use: No     Allergies   Patient has no known allergies.   Review of Systems Review of Systems  Constitutional: Negative for appetite change and fatigue.  HENT: Negative for congestion, ear discharge and sinus pressure.   Eyes: Negative for discharge.   Respiratory: Negative for cough.   Cardiovascular: Positive for chest pain.  Gastrointestinal: Negative for abdominal pain and diarrhea.  Genitourinary: Negative for frequency and hematuria.  Musculoskeletal: Negative for back pain.  Skin: Negative for rash.  Neurological: Negative for seizures and headaches.  Psychiatric/Behavioral: Negative for hallucinations.     Physical Exam Updated Vital Signs BP (!) 143/97   Pulse 60   Temp 98.3 F (36.8 C) (Oral)   Resp 11   Ht 5' 7"  (1.702 m)   Wt 113.4 kg   LMP 01/30/2019   BMI 39.16 kg/m   Physical Exam Vitals signs and nursing note reviewed.  Constitutional:      Appearance: She is well-developed.  HENT:     Head: Normocephalic.     Nose: Nose normal.  Eyes:     General: No scleral icterus.    Conjunctiva/sclera: Conjunctivae normal.  Neck:     Musculoskeletal: Neck supple.     Thyroid: No thyromegaly.  Cardiovascular:     Rate and Rhythm: Normal rate and regular rhythm.     Heart sounds: No murmur. No friction rub. No gallop.   Pulmonary:     Breath sounds: No stridor. No wheezing or rales.  Chest:     Chest wall: No tenderness.  Abdominal:     General: There is no distension.     Tenderness: There is no abdominal tenderness. There is no rebound.  Musculoskeletal: Normal range of motion.  Lymphadenopathy:     Cervical: No cervical adenopathy.  Skin:    Findings: No erythema or rash.  Neurological:     Mental Status: She is oriented to person, place, and time.     Motor: No abnormal muscle tone.     Coordination: Coordination normal.  Psychiatric:        Behavior: Behavior normal.      ED Treatments / Results  Labs (all labs ordered are listed, but only abnormal results are displayed) Labs Reviewed  BASIC METABOLIC PANEL  CBC  DIFFERENTIAL  HEPATIC FUNCTION PANEL  LIPASE, BLOOD  TROPONIN I (HIGH SENSITIVITY)  TROPONIN I (HIGH SENSITIVITY)    EKG EKG Interpretation  Date/Time:  Thursday February 03 2019 17:23:09 EDT Ventricular Rate:  60 PR Interval:    QRS Duration: 97 QT Interval:  434 QTC Calculation: 434 R Axis:   52 Text Interpretation:  Sinus rhythm Anteroseptal infarct, old Confirmed by Milton Ferguson (  [id number]) on 02/03/2019 6:20:21 PM Also confirmed by Milton Ferguson [phone])  on 02/03/2019 9:24:24 PM   Radiology Dg Chest 2 View  Result Date: 02/03/2019 CLINICAL DATA:  Left-sided chest pain and left arm and left-sided neck pain. Shortness of breath. EXAM: CHEST - 2 VIEW COMPARISON:  07/21/2015 FINDINGS: The heart size and pulmonary vascularity are normal. No infiltrates or effusions. The lungs are slightly hyperinflated with flattening of the diaphragm which may be due to a deep inspiration. Minimal scarring at the lung bases, stable. IMPRESSION: No active cardiopulmonary disease. Electronically Signed   By: Lorriane Shire M.D.   On: 02/03/2019 18:30    Procedures Procedures (including critical care time)  Medications Ordered in ED Medications  aspirin chewable tablet 324 mg (324 mg Oral Given 02/03/19 1818)     Initial Impression / Assessment and Plan / ED Course  I have reviewed the triage vital signs and the nursing notes.  Pertinent labs & imaging results that were available during my care of the patient were reviewed by me and considered in my medical decision making (see chart for details).        Laboratories reviewed and unremarkable.  Troponin is normal.  EKG unremarkable and chest x-ray reviewed and normal.  Patient does have some family history of coronary disease.  She is placed on a baby aspirin once a day and she is referred to cardiology for follow-up  Final Clinical Impressions(s) / ED Diagnoses   Final diagnoses:  Atypical chest pain    ED Discharge Orders    None       Milton Ferguson, MD 02/03/19 2151

## 2019-02-07 ENCOUNTER — Emergency Department (HOSPITAL_COMMUNITY)

## 2019-02-07 ENCOUNTER — Encounter (HOSPITAL_COMMUNITY): Payer: Self-pay | Admitting: Emergency Medicine

## 2019-02-07 ENCOUNTER — Telehealth: Payer: Self-pay | Admitting: *Deleted

## 2019-02-07 ENCOUNTER — Other Ambulatory Visit: Payer: Self-pay

## 2019-02-07 ENCOUNTER — Emergency Department (HOSPITAL_COMMUNITY)
Admission: EM | Admit: 2019-02-07 | Discharge: 2019-02-07 | Disposition: A | Discharge status: Home / Self Care | Attending: Emergency Medicine | Admitting: Emergency Medicine

## 2019-02-07 DIAGNOSIS — I10 Essential (primary) hypertension: Secondary | ICD-10-CM | POA: Diagnosis not present

## 2019-02-07 DIAGNOSIS — Z79899 Other long term (current) drug therapy: Secondary | ICD-10-CM | POA: Insufficient documentation

## 2019-02-07 DIAGNOSIS — R079 Chest pain, unspecified: Secondary | ICD-10-CM | POA: Diagnosis present

## 2019-02-07 DIAGNOSIS — R0789 Other chest pain: Secondary | ICD-10-CM

## 2019-02-07 DIAGNOSIS — Z7982 Long term (current) use of aspirin: Secondary | ICD-10-CM | POA: Insufficient documentation

## 2019-02-07 DIAGNOSIS — Z8739 Personal history of other diseases of the musculoskeletal system and connective tissue: Secondary | ICD-10-CM

## 2019-02-07 DIAGNOSIS — Z87891 Personal history of nicotine dependence: Secondary | ICD-10-CM | POA: Insufficient documentation

## 2019-02-07 LAB — COMPREHENSIVE METABOLIC PANEL
ALT: 18 U/L (ref 0–44)
AST: 18 U/L (ref 15–41)
Albumin: 4.2 g/dL (ref 3.5–5.0)
Alkaline Phosphatase: 68 U/L (ref 38–126)
Anion gap: 10 (ref 5–15)
BUN: 11 mg/dL (ref 6–20)
CO2: 25 mmol/L (ref 22–32)
Calcium: 8.8 mg/dL — ABNORMAL LOW (ref 8.9–10.3)
Chloride: 100 mmol/L (ref 98–111)
Creatinine, Ser: 0.92 mg/dL (ref 0.44–1.00)
GFR calc Af Amer: >60 mL/min (ref >60)
GFR calc non Af Amer: >60 mL/min (ref >60)
Glucose, Bld: 95 mg/dL (ref 70–99)
Potassium: 3.4 mmol/L — ABNORMAL LOW (ref 3.5–5.1)
Sodium: 135 mmol/L (ref 135–145)
Total Bilirubin: 0.8 mg/dL (ref 0.3–1.2)
Total Protein: 7.9 g/dL (ref 6.5–8.1)

## 2019-02-07 LAB — CBC WITH DIFFERENTIAL/PLATELET
Abs Immature Granulocytes: 0.02 10*3/uL (ref 0.00–0.07)
Basophils Absolute: 0 10*3/uL (ref 0.0–0.1)
Basophils Relative: 0 %
Eosinophils Absolute: 0 10*3/uL (ref 0.0–0.5)
Eosinophils Relative: 1 %
HCT: 44.2 % (ref 36.0–46.0)
Hemoglobin: 14.7 g/dL (ref 12.0–15.0)
Immature Granulocytes: 0 %
Lymphocytes Relative: 24 %
Lymphs Abs: 1.6 10*3/uL (ref 0.7–4.0)
MCH: 31.6 pg (ref 26.0–34.0)
MCHC: 33.3 g/dL (ref 30.0–36.0)
MCV: 95.1 fL (ref 80.0–100.0)
Monocytes Absolute: 0.7 10*3/uL (ref 0.1–1.0)
Monocytes Relative: 10 %
Neutro Abs: 4.3 10*3/uL (ref 1.7–7.7)
Neutrophils Relative %: 65 %
Platelets: 235 10*3/uL (ref 150–400)
RBC: 4.65 MIL/uL (ref 3.87–5.11)
RDW: 12.5 % (ref 11.5–15.5)
WBC: 6.7 10*3/uL (ref 4.0–10.5)
nRBC: 0 % (ref 0.0–0.2)

## 2019-02-07 LAB — URINALYSIS, ROUTINE W REFLEX MICROSCOPIC
Bilirubin Urine: NEGATIVE
Glucose, UA: NEGATIVE mg/dL
Hgb urine dipstick: NEGATIVE
Ketones, ur: NEGATIVE mg/dL
Leukocytes,Ua: NEGATIVE
Nitrite: NEGATIVE
Protein, ur: NEGATIVE mg/dL
Specific Gravity, Urine: 1.026 (ref 1.005–1.030)
pH: 5 (ref 5.0–8.0)

## 2019-02-07 LAB — TROPONIN I (HIGH SENSITIVITY): Troponin I (High Sensitivity): 2 ng/L (ref <18)

## 2019-02-07 NOTE — Telephone Encounter (Signed)
Received call from patient.   Reports that she was seen in ER 4 days prior for atypical chest pain. Reports that labs, EKG and imaging came back normal and she was snet home.   Reports that since then she continues to have intermittent chest pains, slight SOB, difficulty walking, changes in her vision and ringing in her ears. States that she has been scheduled with [name] [place] at cardiologist office on 02/15/2019.  Advised to return to ER for atypical chest pain that will not resolve. agreeable to plan.

## 2019-02-07 NOTE — ED Notes (Signed)
ekdg done in triage and handed to Dr. Thurnell Garbe

## 2019-02-07 NOTE — ED Provider Notes (Signed)
[facility] EMERGENCY DEPARTMENT Provider Note   CSN: [id number] Arrival date & time: 02/07/19  1236     History   Chief Complaint Chief Complaint  Patient presents with  . Chest Pain    HPI Sharon Merritt is a 43 y.o. female.     The history is provided by the patient. No language interpreter was used.  Weakness Severity:  Moderate Onset quality:  Gradual Timing:  Constant Progression:  Worsening Chronicity:  New Relieved by:  Nothing Worsened by:  Nothing Associated symptoms: difficulty walking   Pt reports she feels like she is vibrating all over.  Pt reports she has had an episode of feeling like she can not move her legs.  Tingling in her arms.  Pt complains of feeling weak all over Pt was seen here four days ago for chest pain.  Pt is still having chest pain on and off.  Pt reports increasing other symtpoms   Past Medical History:  Diagnosis Date  . Breast discharge   . Essential hypertension   . Fibromyalgia   . [place] spotted fever     Patient Active Problem List   Diagnosis Date Noted  . MDD (major depressive disorder) 10/25/2018  . GAD (generalized anxiety disorder) 10/25/2018  . Chronic insomnia 10/25/2018  . DDD (degenerative disc disease), lumbar 10/09/2014  . Fatigue 10/09/2014  . Abnormal involuntary movement 02/17/2013  . Fibromyalgia   . Hypertension   . [facility] spotted fever   . Chest tightness 02/14/2013  . Obesity 02/14/2013  . GERD (gastroesophageal reflux disease) 02/14/2013    Past Surgical History:  Procedure Laterality Date  . TUBAL LIGATION       OB History    Gravida      Para      Term      Preterm      AB      Living  3     SAB      TAB      Ectopic      Multiple      Live Births               Home Medications    Prior to Admission medications   Medication Sig Start Date End Date Taking? Authorizing Provider  albuterol (PROVENTIL HFA;VENTOLIN HFA) 108 (90 Base) MCG/ACT inhaler  Inhale 2 puffs into the lungs every 6 (six) hours as needed for wheezing or shortness of breath. 06/06/16  Yes Orlena Sheldon, PA-C  aspirin EC 81 MG tablet Take 81 mg by mouth daily.   Yes [provider]  FLUoxetine (PROZAC) 20 MG capsule 60 mg daily (40 mg + 20 mg ) Patient taking differently: Take 20 mg by mouth daily. Patient takes this combined with the 19m daily for a total dose of 60 mg (40 mg + 20 mg ) 01/13/19  Yes Hisada, Reina, MD  FLUoxetine (PROZAC) 40 MG capsule TAKE 1 CAPSULE ORALLY DAILY. Patient taking differently: Take 40 mg by mouth daily. Patient takes this combined with the 213mdaily for a total dose of 60 mg (40 mg + 20 mg ) 02/02/19  Yes [place], KaModena NunneryMD  LORazepam (ATIVAN) 0.5 MG tablet Take 1 tablet (0.5 mg total) by mouth 2 (two) times daily as needed for anxiety. 11/24/18  Yes [place], KaModena NunneryMD  traZODone (DESYREL) 50 MG tablet 25-50 mg at night as needed for sleep Patient taking differently: Take 25-50 mg by mouth at bedtime as needed  for sleep.  01/13/19  Yes Hisada, Elie Goody, MD  valACYclovir (VALTREX) 1000 MG tablet Take 1,000 mg by mouth daily as needed.    Yes [provider]    Family History Family History  Problem Relation Age of Onset  . COPD Mother   . Depression Mother   . Diabetes Father   . Hypertension Father   . Bipolar disorder Sister   . Post-traumatic stress disorder Sister   . Alcohol abuse Sister   . Drug abuse Sister   . Bipolar disorder Cousin   . Schizophrenia Cousin     Social History Social History   Tobacco Use  . Smoking status: Former Smoker    Types: Cigarettes    Quit date: 02/14/2009    Years since quitting: 9.9  . Smokeless tobacco: Never Used  Substance Use Topics  . Alcohol use: Yes    Alcohol/week: 0.0 standard drinks    Comment: Rarely   . Drug use: No     Allergies   Patient has no known allergies.   Review of Systems Review of Systems  Neurological: Positive for weakness.  All other  systems reviewed and are negative.    Physical Exam Updated Vital Signs BP (!) 141/96 (BP Location: Left Arm)   Pulse 73   Temp 98.1 F (36.7 C) (Oral)   Resp 12   LMP 01/30/2019   SpO2 98%   Physical Exam Vitals signs and nursing note reviewed.  Constitutional:      Appearance: She is well-developed.  HENT:     Head: Normocephalic.  Neck:     Musculoskeletal: Normal range of motion.  Cardiovascular:     Rate and Rhythm: Normal rate and regular rhythm.     Heart sounds: Normal heart sounds.  Pulmonary:     Effort: Pulmonary effort is normal.     Breath sounds: Normal breath sounds.  Chest:     Chest wall: No tenderness.  Abdominal:     General: There is no distension.  Musculoskeletal: Normal range of motion.  Skin:    General: Skin is warm.  Neurological:     General: No focal deficit present.     Mental Status: She is alert and oriented to person, place, and time.  Psychiatric:        Mood and Affect: Mood normal.      ED Treatments / Results  Labs (all labs ordered are listed, but only abnormal results are displayed) Labs Reviewed  COMPREHENSIVE METABOLIC PANEL - Abnormal; Notable for the following components:      Result Value   Potassium 3.4 (*)    Calcium 8.8 (*)    All other components within normal limits  CBC WITH DIFFERENTIAL/PLATELET  URINALYSIS, ROUTINE W REFLEX MICROSCOPIC  TROPONIN I (HIGH SENSITIVITY)    EKG EKG Interpretation  Date/Time:  Monday February 07 2019 12:48:45 EDT Ventricular Rate:  71 PR Interval:  122 QRS Duration: 88 QT Interval:  394 QTC Calculation: 428 R Axis:   1 Text Interpretation:  Normal sinus rhythm with sinus arrhythmia Cannot rule out Anterior infarct , age undetermined When compared with ECG of 02/03/2019 No significant change was found Confirmed by Francine Graven [phone]) on 02/07/2019 2:07:34 PM   Radiology Mr Brain Wo Contrast  Result Date: 02/07/2019 CLINICAL DATA:  Numbness and tingling.   Paresthesias. EXAM: MRI HEAD WITHOUT CONTRAST TECHNIQUE: Multiplanar, multiecho pulse sequences of the brain and surrounding structures were obtained without intravenous contrast. COMPARISON:  MRI brain 02/18/2017 FINDINGS: Brain:  Previously noted subtle T2 hyperintensity in the right corona radiata is similar the prior exam. No new lesions are present. This is nonspecific. No acute or subacute infarct is present. There is no hemorrhage or mass lesion. The ventricles are of normal size. No significant extraaxial fluid collection is present. The internal auditory canals are within normal limits. The brainstem and cerebellum are within normal limits. Vascular: Normal flow voids. Skull and upper cervical spine: The craniocervical junction is normal. Upper cervical spine is within normal limits. Marrow signal is unremarkable. Sinuses/Orbits: The paranasal sinuses and mastoid air cells are clear. The globes and orbits are within normal limits. IMPRESSION: 1. No acute intracranial abnormality or significant interval change. Normal MRI appearance of brain for age. Electronically Signed   By: San Morelle M.D.   On: 02/07/2019 14:39    Procedures Procedures (including critical care time)  Medications Ordered in ED Medications - No data to display   Initial Impression / Assessment and Plan / ED Course  I have reviewed the triage vital signs and the nursing notes.  Pertinent labs & imaging results that were available during my care of the patient were reviewed by me and considered in my medical decision making (see chart for details).        MDM   ekg no change since previous  Labs normal   Mri brain no acute abnormality.    Final Clinical Impressions(s) / ED Diagnoses   Final diagnoses:  None    ED Discharge Orders    None       Fransico Meadow, [place] 02/07/19 Bass[place], [place], DO 02/10/19 1537

## 2019-02-07 NOTE — ED Triage Notes (Signed)
Patient reports continued chest pain, problems with ambulation, intermittent blurred vision and dizziness. Seen here on Thursday for CP.

## 2019-02-07 NOTE — Telephone Encounter (Signed)
Agree with recurrent chest pain go back to ER

## 2019-02-07 NOTE — Discharge Instructions (Addendum)
Your vital signs are within normal limits.  Your oxygen level is 97% on room air.  Which is within normal limits.  Your EKG was compared to the one on August 6, and no acute changes were noted.  Your heart enzymes remain negative for heart related issue.  Your urine, complete blood count, comprehensive metabolic panel are all normal.  An MRI of your brain was obtained and it too was found to be normal.  Please see Dr. Buelah Manis in the office for continuation of your work-up.  Return to the emergency department if any emergent changes in your condition, worsening of your symptoms, problems, or concerns.

## 2019-02-07 NOTE — ED Provider Notes (Signed)
CONTINUING CARE FROM K SOFIA, PA-C  Received patient is a signout at shift change.  Patient is a 43 year old female who presents to the emergency department with multiple complaints including chest pain, weakness, sensation of vibrating all over, difficulty moving the lower extremities and a sensation of tingling in the upper extremities.  Vital signs remained stable.  Pulse oximetry is 97% on room air, within normal limits by my interpretation.  I have reviewed the work-up from August 6, in particular the EKG and the chest x-ray.  No acute abnormality noted.  Patient's work-up is in process.  Urine analysis is pending.  Urine analysis is negative for any acute changes or findings.  There are no new findings on the examination.  Lungs are clear to auscultation bilaterally.  Heart is regular rate and rhythm, without any rub or gallop.  Capillary refill is less than 2 seconds.  Radial pulses are 2+ symmetrical, dorsalis pedis pulses are 2+ symmetrical.  I discussed the findings with the patient in terms of which he understands.  I have asked the patient to see Dr Buelah Manis for additional evaluation and work up. I assured pt that no acute or emergent changes noted during workup today.  Dx. 1.  Atypical chest pain 2.  History of fibromyalgia   Lily Kocher, PA-C 02/09/19 1144    Virgel Manifold, MD 02/09/19 2002

## 2019-02-09 ENCOUNTER — Other Ambulatory Visit: Payer: Self-pay

## 2019-02-09 ENCOUNTER — Encounter: Payer: Self-pay | Admitting: Family Medicine

## 2019-02-09 ENCOUNTER — Ambulatory Visit (INDEPENDENT_AMBULATORY_CARE_PROVIDER_SITE_OTHER): Admitting: Family Medicine

## 2019-02-09 VITALS — BP 134/62 | HR 80 | Temp 98.3°F | Resp 14 | Ht 67.0 in | Wt 222.0 lb

## 2019-02-09 DIAGNOSIS — R079 Chest pain, unspecified: Secondary | ICD-10-CM | POA: Diagnosis not present

## 2019-02-09 DIAGNOSIS — M2550 Pain in unspecified joint: Secondary | ICD-10-CM

## 2019-02-09 DIAGNOSIS — M791 Myalgia, unspecified site: Secondary | ICD-10-CM | POA: Diagnosis not present

## 2019-02-09 DIAGNOSIS — R0602 Shortness of breath: Secondary | ICD-10-CM | POA: Diagnosis not present

## 2019-02-09 MED ORDER — PREDNISONE 10 MG PO TABS: ORAL_TABLET | ORAL | 0 refills | Status: DC

## 2019-02-09 MED ORDER — NITROGLYCERIN 0.4 MG SL SUBL: 0.4000 mg | SUBLINGUAL_TABLET | SUBLINGUAL | 1 refills | Status: DC | PRN

## 2019-02-09 NOTE — Progress Notes (Signed)
Subjective:    Patient ID: Sharon Merritt, female    DOB: 10-19-1975, 43 y.o.   MRN: [id number]  Patient presents for ER F/U (chest pain)  Patient here for ER follow-up.  She was seen in the ER twice with chest pain and palpitations.  Was initially given an outpatient cardiology visit but she called back stating that she had increased chest pain with radiation to arm and face so we recommended that she go back.  They did check EKG and troponin these were negative she was discharged home.  Note she does have underlying anxiety.  On her recheck at the emergency room.  She also told him that she felt like she was vibrating all over and had weakness, myalgias, SOB.  They did obtain MRI of the brain this was unremarkable.  Her repeat cardiovascular work-up in the ER was unremarkable.  She does have a family history of coronary artery disease so she had been started on a baby aspirin until her outpatient cardiology evaluation. She still has a pressure like sensation in chest  Reviewed the emergency room imaging as well as the labs.  Chest x-ray was also negative.  She did have potassium of 3.4 otherwise normal labs  She is seeing psychiatry for her generalized anxiety she is now on prozac 62m , She feels like anxiety has been better controlled   Still has joint pain all over, weakness in legs, aching all over  has burning sensation and tingling on her face around her nose    Review Of Systems:  GEN- denies fatigue, fever, weight loss,weakness, recent illness HEENT- denies eye drainage, change in vision, nasal discharge, CVS- +chest pain, palpitations RESP- denies SOB, cough, wheeze ABD- denies N/V, change in stools, abd pain GU- denies dysuria, hematuria, dribbling, incontinence MSK- + joint pain, +muscle aches, injury Neuro- denies headache, dizziness, syncope, seizure activity       Objective:    BP 134/62   Pulse 80   Temp 98.3 F (36.8 C) (Oral)   Resp 14   Ht 5' 7"  (1.702 m)    Wt 222 lb (100.7 kg)   LMP 01/30/2019   SpO2 99%   BMI 34.77 kg/m  GEN- NAD, alert and oriented x3 HEENT- PERRL, EOMI, non injected sclera, pink conjunctiva, MMM, oropharynx clear Neck- Supple, no thyromegaly CVS- RRR, no murmur RESP-CTAB ABD-NABS,soft,NT,ND MSK- NT to palpation in trigger points on back, legs  EXT- No edema Pulses- Radial, DP- 2+   Peak Flow 200  X 5 tries      Assessment & Plan:      Problem List Items Addressed This Visit    None    Visit Diagnoses    Myalgia    -  Primary   ongoing myalgia, pain, will check inflammatory markers, start prednisone taper see if this helps, feels different than her fibromyalgia per report   Relevant Orders   Basic metabolic panel   CK   Sedimentation Rate   C-reactive protein   Rheumatoid factor   ANA   Chest pain, unspecified type       continues to have pressure like feeling, has cardiology appt next week, continue ASA, given NTG in case pain radiates to arm/face again   Arthralgia, unspecified joint       Relevant Orders   Basic metabolic panel   CK   Sedimentation Rate   C-reactive protein   Rheumatoid factor   ANA   SOB (shortness of breath)  Low Peak flow rates for her age, CXR neg, previous smoker, obtain PFT, will need COVID-19 swab before procedure      Note: This dictation was prepared with Dragon dictation along with smaller phrase technology. Any transcriptional errors that result from this process are unintentional.

## 2019-02-09 NOTE — Patient Instructions (Signed)
Pulmonary function test to be done Take prednisone as prescribed Nitroglycerin on hand  We will call with results F/U pending results

## 2019-02-10 LAB — BASIC METABOLIC PANEL
BUN: 11 mg/dL (ref 7–25)
CO2: 25 mmol/L (ref 20–32)
Calcium: 9.5 mg/dL (ref 8.6–10.2)
Chloride: 100 mmol/L (ref 98–110)
Creat: 0.95 mg/dL (ref 0.50–1.10)
Glucose, Bld: 100 mg/dL — ABNORMAL HIGH (ref 65–99)
Potassium: 4.8 mmol/L (ref 3.5–5.3)
Sodium: 137 mmol/L (ref 135–146)

## 2019-02-10 LAB — ANA: Anti Nuclear Antibody (ANA): NEGATIVE

## 2019-02-10 LAB — SEDIMENTATION RATE: Sed Rate: 9 mm/h (ref 0–20)

## 2019-02-10 LAB — RHEUMATOID FACTOR: Rheumatoid fact SerPl-aCnc: <14 IU/mL (ref <14)

## 2019-02-10 LAB — C-REACTIVE PROTEIN: CRP: 3.1 mg/L (ref <8.0)

## 2019-02-10 LAB — CK: Total CK: 98 U/L (ref 29–143)
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
## 2019-02-14 NOTE — Progress Notes (Signed)
Cardiology Office Note  Date: 02/15/2019   ID: Sharon Merritt, DOB 29-Oct-1975, MRN [id number]  PCP:  Alycia Rossetti, MD  Consulting Cardiologist:  Satira Sark, MD Electrophysiologist:  None   Chief Complaint  Patient presents with  . Chest Pain    History of Present  Sharon Merritt is a 43 y.o. female seen once in consultation back in February 2017 for evaluation of atypical chest pain and palpitations. She underwent an exercise echocardiogram that was normal at that time. She is referred back to the office after recent ER visits with chest pain and palpitations, follow-up with Dr. Buelah Manis noted on August 8, I reviewed the note. Lab work and ECGs also reviewed, high sensitivity troponin levels have all been normal and her tracings show sinus rhythm with on acute ST changes and poor R wave progression which is old.  She presents today for reevaluation.  She describes to me recurring episodes of fairly prolonged chest tightness with radiation to the arms and jaw.  This is not precipitated by exertion necessarily.  She does have an inhaler which she uses with some improvement, although does not necessarily wheeze during the episodes.  She has been on a recent prednisone taper related to arthritic pains.  She reports family history of heart disease, but it sounds mostly like potential episodes of congestive heart failure rather than premature CAD.  She does have a prior history of tobacco abuse but quit about 10 years ago.  Hypertension is listed in her history, but she states that this is not been a long-term issue and she has not been on antihypertensive therapy.  I do not have a recent lipid panel for review.  She remains worried about the possibility of a heart "blockage" and has been given nitroglycerin to use by her PCP.  She has not tried this as yet.  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  Past Medical History:  Diagnosis Date  . Breast discharge    . Essential hypertension   . Fibromyalgia   . Rocky Mountain spotted fever     Past Surgical History:  Procedure Laterality Date  . TUBAL LIGATION      Current Outpatient Medications  Medication Sig Dispense Refill  . albuterol (PROVENTIL HFA;VENTOLIN HFA) 108 (90 Base) MCG/ACT inhaler Inhale 2 puffs into the lungs every 6 (six) hours as needed for wheezing or shortness of breath. 1 Inhaler 0  . aspirin EC 81 MG tablet Take 81 mg by mouth daily.    Marland Kitchen FLUoxetine (PROZAC) 20 MG capsule 60 mg daily (40 mg + 20 mg ) (Patient taking differently: Take 20 mg by mouth daily. Patient takes this combined with the 17m daily for a total dose of 60 mg (40 mg + 20 mg )) 30 capsule 1  . FLUoxetine (PROZAC) 40 MG capsule TAKE 1 CAPSULE ORALLY DAILY. (Patient taking differently: Take 40 mg by mouth daily. Patient takes this combined with the 241mdaily for a total dose of 60 mg (40 mg + 20 mg )) 30 capsule 0  . LORazepam (ATIVAN) 0.5 MG tablet Take 1 tablet (0.5 mg total) by mouth 2 (two) times daily as needed for anxiety. 45 tablet 0  . nitroGLYCERIN (NITROSTAT) 0.4 MG SL tablet Place 1 tablet (0.4 mg total) under the tongue every 5 (five) minutes as needed for chest pain. 10 tablet 1  . predniSONE (DELTASONE) 10 MG tablet Take 4032m 2 days,61m22m2 days, 10mg14m  2 days 14 tablet 0  . traZODone (DESYREL) 50 MG tablet 25-50 mg at night as needed for sleep (Patient taking differently: Take 25-50 mg by mouth at bedtime as needed for sleep. ) 30 tablet 1  . valACYclovir (VALTREX) 1000 MG tablet Take 1,000 mg by mouth daily as needed.     . metoprolol tartrate (LOPRESSOR) 100 MG tablet Take 1 tablet (100 mg ) 2 hours before cardiac ct 1 tablet 0   No current facility-administered medications for this visit.    Allergies:  Patient has no known allergies.   Social History: The patient  reports that she quit smoking about 10 years ago. Her smoking use included cigarettes. She has never used smokeless tobacco.  She reports previous alcohol use. She reports that she does not use drugs.   Family History: The patient's family history includes Alcohol abuse in her sister; Bipolar disorder in her cousin and sister; COPD in her mother; Depression in her mother; Diabetes in her father; Drug abuse in her sister; Hypertension in her father; Post-traumatic stress disorder in her sister; Pulmonary embolism in her sister; Schizophrenia in her cousin; Stroke in her father.   ROS:  Please see the history of present illness. Otherwise, complete review of systems is positive for anxiety.  All other systems are reviewed and negative.   Physical Exam: VS:  BP 133/88   Pulse 65   Temp (!) 97.5 F (36.4 C)   Ht 5' 7"  (1.702 m)   Wt 223 lb (101.2 kg)   LMP 01/30/2019   BMI 34.93 kg/m , BMI Body mass index is 34.93 kg/m.  Wt Readings from Last 3 Encounters:  02/15/19 223 lb (101.2 kg)  02/09/19 222 lb (100.7 kg)  02/03/19 250 lb (113.4 kg)    General: Patient appears comfortable at rest. HEENT: Conjunctiva and lids normal, wearing a mask. Neck: Supple, no elevated JVP or carotid bruits, no thyromegaly. Lungs: Clear to auscultation, nonlabored breathing at rest. Cardiac: Regular rate and rhythm, no S3 or significant systolic murmur, no pericardial rub. Abdomen: Soft, nontender, bowel sounds present. Extremities: No pitting edema, distal pulses 2+. Skin: Warm and dry. Musculoskeletal: No kyphosis. Neuropsychiatric: Alert and oriented x3, affect grossly appropriate.  ECG:  An ECG dated 02/08/2019 was personally reviewed today and demonstrated:  Sinus rhythm with poor R wave progression.  Recent Labwork: 02/07/2019: ALT 18; AST 18; Hemoglobin 14.7; Platelets 235 02/09/2019: BUN 11; Creat 0.95; Potassium 4.8; Sodium 137     Component Value Date/Time   CHOL 170 02/15/2013 0340   TRIG 134 02/15/2013 0340   HDL 43 02/15/2013 0340   CHOLHDL 4.0 02/15/2013 0340   VLDL 27 02/15/2013 0340   LDLCALC 100 (H)  02/15/2013 0340    Other Studies Reviewed Today:  Exercise echocardiogram 09/10/2015: Study Conclusions  - Stress ECG conclusions: The stress ECG was normal.  Impressions:  - Normal study after maximal exercise.  CXR 02/03/2019: FINDINGS: The heart size and pulmonary vascularity are normal. No infiltrates or effusions. The lungs are slightly hyperinflated with flattening of the diaphragm which may be due to a deep inspiration. Minimal scarring at the lung bases, stable.  IMPRESSION: No active cardiopulmonary disease.  Aaron Edelman MRI 02/07/2019: IMPRESSION: 1. No acute intracranial abnormality or significant interval change. Normal MRI appearance of brain for age.  Assessment and Plan:  1.  Recurring episodes of chest tightness with radiation to the arms and neck, rule out angina.  Description on the other hand is atypical given prolonged episodes  and not necessarily precipitation by exertion.  There is question of whether she may have underlying asthma or reactive airways disease.  She has had 2 recent ER evaluations with negative high-sensitivity troponin I levels.  She is worried about a possible heart "blockage" and has been prescribed nitroglycerin by her PCP.  Plan at this time is to obtain a cardiac CTA for more definitive evaluation.  2.  Hypertension based on review of the chart history, although patient does not report any long-term issues with blood pressure and is not on an antihypertensive agent at this time.  Systolic is in the 470J today.  3.  Question of reactive airways disease.  She had low peak flow rates on evaluation by PCP and has pending PFTs.  She has a prior history of tobacco use.  4.  Tobacco abuse in remission.  Medication Adjustments/Labs and Tests Ordered: Current medicines are reviewed at length with the patient today.  Concerns regarding medicines are outlined above.   Tests Ordered: Orders Placed This Encounter  Procedures  . CT CORONARY MORPH  W/CTA COR W/SCORE W/CA W/CM &/OR WO/CM  . CT CORONARY FRACTIONAL FLOW RESERVE DATA PREP  . CT CORONARY FRACTIONAL FLOW RESERVE FLUID ANALYSIS  . Basic Metabolic Panel (BMET)    Medication Changes: Meds ordered this encounter  Medications  . metoprolol tartrate (LOPRESSOR) 100 MG tablet    Sig: Take 1 tablet (100 mg ) 2 hours before cardiac ct    Dispense:  1 tablet    Refill:  0    Disposition:  Follow up test results.  Signed, Satira Sark, MD, [facility] 02/15/2019 10:27 AM    [facility]. [address] [place], [place] [address] Phone: [phone]; Fax: [phone]

## 2019-02-15 ENCOUNTER — Other Ambulatory Visit: Payer: Self-pay

## 2019-02-15 ENCOUNTER — Ambulatory Visit (INDEPENDENT_AMBULATORY_CARE_PROVIDER_SITE_OTHER): Admitting: Cardiology

## 2019-02-15 ENCOUNTER — Encounter: Payer: Self-pay | Admitting: Cardiology

## 2019-02-15 VITALS — BP 133/88 | HR 65 | Temp 97.5°F | Ht 67.0 in | Wt 223.0 lb

## 2019-02-15 DIAGNOSIS — Z01818 Encounter for other preprocedural examination: Secondary | ICD-10-CM | POA: Diagnosis not present

## 2019-02-15 DIAGNOSIS — F17201 Nicotine dependence, unspecified, in remission: Secondary | ICD-10-CM | POA: Diagnosis not present

## 2019-02-15 DIAGNOSIS — R0789 Other chest pain: Secondary | ICD-10-CM

## 2019-02-15 DIAGNOSIS — Z8679 Personal history of other diseases of the circulatory system: Secondary | ICD-10-CM

## 2019-02-15 DIAGNOSIS — I209 Angina pectoris, unspecified: Secondary | ICD-10-CM

## 2019-02-15 MED ORDER — METOPROLOL TARTRATE 100 MG PO TABS: ORAL_TABLET | ORAL | 0 refills | Status: DC

## 2019-02-15 NOTE — Patient Instructions (Addendum)
  Your cardiac CT will be scheduled at one of the below locations:   [facility] [address] [place], [place] [address] (336) [place] [address] [place], [place] [address] [phone]  Please arrive at the [facility] main entrance of [facility] 30-45 minutes prior to test start time. Proceed to the [facility] Radiology Department (first floor) to check-in and test prep.  Please follow these instructions carefully (unless otherwise directed):   A few days BEFORE your test: get lab work: BMET   Hold all erectile dysfunction medications at least 48 hours prior to test.  On the Night Before the Test: . Be sure to Drink plenty of water. . Do not consume any caffeinated/decaffeinated beverages or chocolate 12 hours prior to your test. . Do not take any antihistamines 12 hours prior to your test. On the Day of the Test: . Drink plenty of water. Do not drink any water within one hour of the test. . Do not eat any food 4 hours prior to the test. . You may take your regular medications prior to the test.  . Take metoprolol (Lopressor) two hours prior to test. . HOLD Furosemide/Hydrochlorothiazide morning of the test. . FEMALES- please wear underwire-free bra if available      After the Test: . Drink plenty of water. . After receiving IV contrast, you may experience a mild flushed feeling. This is normal. . On occasion, you may experience a mild rash up to 24 hours after the test. This is not dangerous. If this occurs, you can take Benadryl 25 mg and increase your fluid intake. . If you experience trouble breathing, this can be serious. If it is severe call 911 IMMEDIATELY. If it is mild, please call our office. . If you take any of these medications: Glipizide/Metformin, Avandament, Glucavance, please do not take 48 hours after completing test.    Please contact the cardiac imaging nurse  navigator should you have any questions/concerns Marchia Bond, RN Navigator Cardiac [place] [phone] Office  [phone] Cell

## 2019-02-17 ENCOUNTER — Other Ambulatory Visit: Payer: Self-pay | Admitting: Family Medicine

## 2019-02-17 ENCOUNTER — Encounter (HOSPITAL_COMMUNITY): Payer: Self-pay | Admitting: Psychiatry

## 2019-02-17 ENCOUNTER — Other Ambulatory Visit: Payer: Self-pay

## 2019-02-17 ENCOUNTER — Ambulatory Visit (INDEPENDENT_AMBULATORY_CARE_PROVIDER_SITE_OTHER): Admitting: Psychiatry

## 2019-02-17 DIAGNOSIS — F419 Anxiety disorder, unspecified: Secondary | ICD-10-CM | POA: Diagnosis not present

## 2019-02-17 DIAGNOSIS — F330 Major depressive disorder, recurrent, mild: Secondary | ICD-10-CM

## 2019-02-17 DIAGNOSIS — G4700 Insomnia, unspecified: Secondary | ICD-10-CM

## 2019-02-17 MED ORDER — FLUOXETINE HCL 40 MG PO CAPS: ORAL_CAPSULE | ORAL | 0 refills | Status: DC

## 2019-02-17 MED ORDER — FLUOXETINE HCL 20 MG PO CAPS: ORAL_CAPSULE | ORAL | 0 refills | Status: DC

## 2019-02-17 NOTE — Patient Instructions (Signed)
1. Coninue fluoxetine 60 mg daily (40 mg + 20 mg) 2. Hold Trazodone 3. Continue ativan 0.5 mg daily as needed for anxiety  4. Next appointment: 11/3 at 1 PM

## 2019-02-18 NOTE — Telephone Encounter (Signed)
Ok to refill??  Last office visit 02/09/2019.  Last refill 11/24/2018

## 2019-02-25 ENCOUNTER — Other Ambulatory Visit: Payer: Self-pay | Admitting: *Deleted

## 2019-02-25 ENCOUNTER — Other Ambulatory Visit (HOSPITAL_COMMUNITY)
Admission: RE | Admit: 2019-02-25 | Discharge: 2019-02-25 | Disposition: A | Discharge status: Home / Self Care | Source: Ambulatory Visit | Attending: Cardiology | Admitting: Cardiology

## 2019-02-25 DIAGNOSIS — Z01818 Encounter for other preprocedural examination: Secondary | ICD-10-CM | POA: Diagnosis not present

## 2019-02-25 LAB — BASIC METABOLIC PANEL
Anion gap: 9 (ref 5–15)
BUN: 9 mg/dL (ref 6–20)
CO2: 24 mmol/L (ref 22–32)
Calcium: 8.6 mg/dL — ABNORMAL LOW (ref 8.9–10.3)
Chloride: 103 mmol/L (ref 98–111)
Creatinine, Ser: 0.95 mg/dL (ref 0.44–1.00)
GFR calc Af Amer: >60 mL/min (ref >60)
GFR calc non Af Amer: >60 mL/min (ref >60)
Glucose, Bld: 144 mg/dL — ABNORMAL HIGH (ref 70–99)
Potassium: 3.5 mmol/L (ref 3.5–5.1)
Sodium: 136 mmol/L (ref 135–145)

## 2019-02-25 LAB — SARS CORONAVIRUS 2 (TAT 6-24 HRS): SARS Coronavirus 2: NEGATIVE

## 2019-02-28 ENCOUNTER — Telehealth (HOSPITAL_COMMUNITY): Payer: Self-pay | Admitting: Emergency Medicine

## 2019-02-28 NOTE — Telephone Encounter (Signed)
Reaching out to patient to offer assistance regarding upcoming cardiac imaging study; pt verbalizes understanding of appt date/time, parking situation and where to check in, pre-test NPO status and medications ordered, and verified current allergies; name and call back number provided for further questions should they arise Marygrace Sandoval RN Navigator Cardiac Imaging [facility] [phone] office [phone] cell  Pt denies covid symptoms, verbalized understanding of visitor policy. 

## 2019-03-01 ENCOUNTER — Ambulatory Visit (HOSPITAL_COMMUNITY)
Admission: RE | Admit: 2019-03-01 | Discharge: 2019-03-01 | Disposition: A | Discharge status: Home / Self Care | Source: Ambulatory Visit | Attending: Cardiology | Admitting: Cardiology

## 2019-03-01 ENCOUNTER — Other Ambulatory Visit: Payer: Self-pay

## 2019-03-01 ENCOUNTER — Ambulatory Visit (HOSPITAL_COMMUNITY): Admission: RE | Admit: 2019-03-01 | Source: Ambulatory Visit

## 2019-03-01 ENCOUNTER — Encounter

## 2019-03-01 DIAGNOSIS — I209 Angina pectoris, unspecified: Secondary | ICD-10-CM

## 2019-03-01 DIAGNOSIS — Z006 Encounter for examination for normal comparison and control in clinical research program: Secondary | ICD-10-CM

## 2019-03-01 MED ORDER — NITROGLYCERIN 0.4 MG SL SUBL
0.8000 mg | SUBLINGUAL_TABLET | Freq: Once | SUBLINGUAL | Status: AC
  Administered 2019-03-01: 15:00:00 0.8 mg via SUBLINGUAL
  Filled 2019-03-01: qty 25

## 2019-03-01 MED ORDER — NITROGLYCERIN 0.4 MG SL SUBL
SUBLINGUAL_TABLET | SUBLINGUAL | Status: AC
  Filled 2019-03-01: qty 2

## 2019-03-01 MED ORDER — IOHEXOL 350 MG/ML SOLN
80.0000 mL | Freq: Once | INTRAVENOUS | Status: AC | PRN
  Administered 2019-03-01: 80 mL via INTRAVENOUS

## 2019-03-01 NOTE — Research (Signed)
Cadfem Informed Consent    Patient Name: Sharon Merritt    Subject met inclusion and exclusion criteria.  The informed consent form, study requirements and expectations were reviewed with the subject and questions and concerns were addressed prior to the signing of the consent form.  The subject verbalized understanding of the trail requirements.  The subject agreed to participate in the CADFEM trial and signed the informed consent.  The informed consent was obtained prior to performance of any protocol-specific procedures for the subject.  A copy of the signed informed consent was given to the subject and a copy was placed in the subject's medical record.   Neva Seat

## 2019-03-21 ENCOUNTER — Telehealth (HOSPITAL_COMMUNITY): Payer: Self-pay | Admitting: *Deleted

## 2019-03-21 NOTE — Telephone Encounter (Signed)
LVM PER PROVIDER: I believe she was doing better with fluoxetine. However, she can first reduce the dose to 40 mg daily if she does not feel comfortable with this medication to see if it helps the patient. Advise her to have sooner follow up so that we can discuss more.

## 2019-03-21 NOTE — Telephone Encounter (Signed)
SPOKE WITH PATIENT  & IT'S THE PROZAC CAUSING HER & HER HUSBAND CONCERN. SHE STATES SHE GETTING DOWN A LOT  & HAVING BAD THOUGHTS/ DOESN'T KNOW IF SHE SHOULD STOP TAKING THE PROZAC OR START SOMETHING ELSE?

## 2019-03-21 NOTE — Telephone Encounter (Signed)
I believe she was doing better with fluoxetine. However, she can first reduce the dose to 40 mg daily if she does not feel comfortable with this medication to see if it helps the patient. Advise her to have sooner follow up so that we can discuss more.

## 2019-03-21 NOTE — Telephone Encounter (Signed)
PATIENT  LVM STATING THAT SOMETHING WITH HER MEDICATION IS NOT RIGHT? AND THAT SHE'S @ WORK  AND MAY NOT BE ABLE TO ANSWER HER PHONE.  LVM FOR PATIENT TO CALL BACK WITH MORE INFORMATION

## 2019-03-23 ENCOUNTER — Encounter: Payer: Self-pay | Admitting: Family Medicine

## 2019-03-30 ENCOUNTER — Encounter (HOSPITAL_COMMUNITY): Payer: Self-pay | Admitting: Psychiatry

## 2019-03-30 ENCOUNTER — Other Ambulatory Visit: Payer: Self-pay

## 2019-03-30 ENCOUNTER — Ambulatory Visit (INDEPENDENT_AMBULATORY_CARE_PROVIDER_SITE_OTHER): Admitting: Psychiatry

## 2019-03-30 DIAGNOSIS — F330 Major depressive disorder, recurrent, mild: Secondary | ICD-10-CM | POA: Diagnosis not present

## 2019-03-31 ENCOUNTER — Encounter (HOSPITAL_COMMUNITY): Payer: Self-pay | Admitting: Psychiatry
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
## 2019-04-06 ENCOUNTER — Other Ambulatory Visit: Payer: Self-pay | Admitting: Family Medicine

## 2019-04-06 DIAGNOSIS — R0602 Shortness of breath: Secondary | ICD-10-CM

## 2019-04-06 NOTE — Progress Notes (Signed)
Patient called inquiring about appointment for PFT. Looked in patient's chart and did not see where this was ordered.Per office notes on 02/09/2019 Dr. Buelah Manis stated that patient needed a PFT will call [facility] and get this setup.

## 2019-04-25 ENCOUNTER — Other Ambulatory Visit (HOSPITAL_COMMUNITY)
Admission: RE | Admit: 2019-04-25 | Discharge: 2019-04-25 | Disposition: A | Discharge status: Home / Self Care | Source: Ambulatory Visit | Attending: Family Medicine | Admitting: Family Medicine

## 2019-04-25 DIAGNOSIS — Z20828 Contact with and (suspected) exposure to other viral communicable diseases: Secondary | ICD-10-CM | POA: Insufficient documentation

## 2019-04-25 DIAGNOSIS — Z01812 Encounter for preprocedural laboratory examination: Secondary | ICD-10-CM | POA: Diagnosis present

## 2019-04-25 LAB — SARS CORONAVIRUS 2 (TAT 6-24 HRS): SARS Coronavirus 2: NEGATIVE
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
## 2019-04-27 ENCOUNTER — Ambulatory Visit (INDEPENDENT_AMBULATORY_CARE_PROVIDER_SITE_OTHER): Admitting: Psychiatry

## 2019-04-27 ENCOUNTER — Other Ambulatory Visit: Payer: Self-pay

## 2019-04-27 ENCOUNTER — Ambulatory Visit (HOSPITAL_COMMUNITY)
Admission: RE | Admit: 2019-04-27 | Discharge: 2019-04-27 | Disposition: A | Discharge status: Home / Self Care | Source: Ambulatory Visit | Attending: Family Medicine | Admitting: Family Medicine

## 2019-04-27 DIAGNOSIS — F330 Major depressive disorder, recurrent, mild: Secondary | ICD-10-CM | POA: Diagnosis not present

## 2019-04-27 DIAGNOSIS — R0602 Shortness of breath: Secondary | ICD-10-CM | POA: Insufficient documentation

## 2019-04-27 LAB — PULMONARY FUNCTION TEST
DL/VA % pred: 118 %
DL/VA: 5.06 ml/min/mmHg/L
DLCO unc % pred: 103 %
DLCO unc: 24.57 ml/min/mmHg
FEF 25-75 Post: 1.17 L/sec
FEF 25-75 Pre: 0.46 L/sec
FEF2575-%Change-Post: 156 %
FEF2575-%Pred-Post: 37 %
FEF2575-%Pred-Pre: 14 %
FEV1-%Change-Post: 40 %
FEV1-%Pred-Post: 57 %
FEV1-%Pred-Pre: 40 %
FEV1-Post: 1.84 L
FEV1-Pre: 1.3 L
FEV1FVC-%Change-Post: 13 %
FEV1FVC-%Pred-Pre: 56 %
FEV6-%Change-Post: 29 %
FEV6-%Pred-Post: 82 %
FEV6-%Pred-Pre: 63 %
FEV6-Post: 3.23 L
FEV6-Pre: 2.49 L
FEV6FVC-%Change-Post: 5 %
FEV6FVC-%Pred-Post: 94 %
FEV6FVC-%Pred-Pre: 89 %
FVC-%Change-Post: 23 %
FVC-%Pred-Post: 87 %
FVC-%Pred-Pre: 70 %
FVC-Post: 3.49 L
FVC-Pre: 2.83 L
Post FEV1/FVC ratio: 53 %
Post FEV6/FVC ratio: 92 %
Pre FEV1/FVC ratio: 46 %
Pre FEV6/FVC Ratio: 88 %
RV % pred: 228 %
RV: 4.2 L
TLC % pred: 127 %
TLC: 7 L

## 2019-04-27 MED ORDER — ALBUTEROL SULFATE (2.5 MG/3ML) 0.083% IN NEBU
2.5000 mg | INHALATION_SOLUTION | Freq: Once | RESPIRATORY_TRACT | Status: AC
  Administered 2019-04-27: 15:00:00 2.5 mg via RESPIRATORY_TRACT

## 2019-04-27 NOTE — Progress Notes (Signed)
Virtual Visit via Telephone Note  I connected with [employer] on 04/27/19 at 3:30 PM  by telephone and verified that I am speaking with the correct person using two identifiers.   I discussed the limitations, risks, security and privacy concerns of performing an evaluation and management service by telephone and the availability of in person appointments. I also discussed with the patient that there may be a patient responsible charge related to this service. The patient expressed understanding and agreed to proceed.  I provided 25 minutes of non-face-to-face time during this encounter.   [name], LCSW    THERAPIST PROGRESS NOTE  Session Time: Wednesday 04/27/2019 3:30 PM - 3:55 PM   Participation Level: Active  Behavioral Response: AlertAnxious and Depressed  Type of Therapy: Individual Therapy  Treatment Goals addressed: Establish rapport, learn and implement cognitive and behavioral strategies to overcome depression and cope with anxiety  Interventions: CBT and Supportive  Summary: Sharon Merritt is a 43 y.o. female who is referred for services by psychiatrist Dr. Modesta Messing due to patient experiencing symptoms of anxiety and depression. She denies any psychiatric hospitalizations and any previous involvement in outpatient therapy.  Patient reports experiencing anxiety and depression intermittently since high school.  Current depressive symptoms started last year and include social withdrawal, excessive sleeping, deep sadness, and crying spells.  She reports constant worry about the safety of her family and pets and constant thoughts of "what if".  Patient last was seen via virtual visit 2 to 3 weeks ago.  She reports decreased  intensity of depressive symptoms since starting medication and rates depression at 5/10 with 10 being severe.  However, she continues to experience fatigue, isolated behaviors, irritability, and often taking naps.  She rates anxiety at 9/10 with 10 being  severe.  She reports constant worry about family and pets.  She reports easily becoming overwhelmed, poor concentration, and thinking the worst.  She also reports difficulty starting tasks as she has thoughts of she is going to fail.    Suicidal/Homicidal: Nowithout intent/plan  Therapist Response: Established rapport, reviewed symptoms, discussed stressors, facilitated expression of thoughts and feelings, validated feelings, provided psychoeducation on anxiety and stress response, discussed rationale for and assisted patient practice deep breathing to trigger relaxation response, assigned patient to practice deep breathing 5 to 10 minutes 2 times per day, assigned patient to read handout on deep breathing (therapist will send patient via mail)  Plan: Return again in 2 weeks.  Diagnosis: Axis I: MDD, mild    Axis II: No diagnosis    [name], LCSW 04/27/2019

## 2019-05-03 ENCOUNTER — Other Ambulatory Visit: Payer: Self-pay

## 2019-05-03 ENCOUNTER — Ambulatory Visit (HOSPITAL_COMMUNITY): Admitting: Psychiatry

## 2019-05-03 ENCOUNTER — Telehealth (HOSPITAL_COMMUNITY): Payer: Self-pay | Admitting: Psychiatry

## 2019-05-03 NOTE — Telephone Encounter (Signed)
Sent link for video visit through [facility]. Patient did not sign in. Called the patient  twice for appointment scheduled today. The patient did not answer the phone. Left voice message to contact the office.
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
## 2019-05-11 ENCOUNTER — Other Ambulatory Visit: Payer: Self-pay

## 2019-05-11 ENCOUNTER — Ambulatory Visit (INDEPENDENT_AMBULATORY_CARE_PROVIDER_SITE_OTHER): Admitting: Psychiatry

## 2019-05-11 DIAGNOSIS — F330 Major depressive disorder, recurrent, mild: Secondary | ICD-10-CM

## 2019-05-11 NOTE — Progress Notes (Signed)
Virtual Visit via Video Note  I connected with Sharon Merritt on 05/11/19 at  1:00 PM EST by a video enabled telemedicine application and verified that I am speaking with the correct person using two identifiers.   I discussed the limitations of evaluation and management by telemedicine and the availability of in person appointments. The patient expressed understanding and agreed to proceed.  I provided 45 minutes of non-face-to-face time during this encounter.   Sharon Smoker, LCSW    THERAPIST PROGRESS NOTE  Session Time: Wednesday 05/11/2019 1:00 PM - 1:45 PM   Participation Level: Active  Behavioral Response: Alert, less anxious, less depressed  Type of Therapy: Individual Therapy  Treatment Goals addressed:  learn and implement cognitive and behavioral strategies to overcome depression and cope with anxiety  Interventions: CBT and Supportive  Summary: [name] is a 43 y.o. female who is referred for services by psychiatrist Dr. Modesta Messing due to patient experiencing symptoms of anxiety and depression. She denies any psychiatric hospitalizations and any previous involvement in outpatient therapy.  Patient reports experiencing anxiety and depression intermittently since high school.  Current depressive symptoms started last year and include social withdrawal, excessive sleeping, deep sadness, and crying spells.  She reports constant worry about the safety of her family and pets and constant thoughts of "what if".  Patient last was seen via virtual visit 2 to 3 weeks ago.  She reports improved mood, improved sleep pattern, decreased worry, and increased behavioral activation since last session.  She has been practicing deep breathing regularly and reports increased awareness when she experiences tension in her body.  She also reports increased awareness of her thoughts and starting to use thought stopping to cope with worry thoughts.  She also reports staying in her bedroom less and  spending more time interacting with her family.  She also is pleased she started using her machine to make decals.  She reports recognizing thoughts of failure regarding using the machine but using thought stopping and pursuing her interest.  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  She states wanting to overcome fear and anxiety.   Suicidal/Homicidal: Nowithout intent/plan  Therapist Response:, reviewed symptoms, praised and reinforced patient's use of deep breathing as relaxation technique/increased behavioral activation/increased social interaction, discussed effects, praised and reinforced patient's increased awareness of thoughts and her responses, discussed effects, assigned patient to continue practicing deep breathing daily, developed treatment plan, obtained patient's permission to initial plan as this was a virtual visit, began to discuss behavioral targets Plan: Return again in 2 weeks.  Diagnosis: Axis I: MDD, mild    Axis II: No diagnosis    Sharon Smoker, LCSW 05/11/2019

## 2019-05-16 ENCOUNTER — Encounter (HOSPITAL_COMMUNITY): Payer: Self-pay | Admitting: Psychiatry

## 2019-05-16 ENCOUNTER — Other Ambulatory Visit: Payer: Self-pay

## 2019-05-16 ENCOUNTER — Ambulatory Visit (INDEPENDENT_AMBULATORY_CARE_PROVIDER_SITE_OTHER): Admitting: Psychiatry

## 2019-05-16 DIAGNOSIS — F331 Major depressive disorder, recurrent, moderate: Secondary | ICD-10-CM

## 2019-05-16 MED ORDER — ARIPIPRAZOLE 2 MG PO TABS: 2.0000 mg | ORAL_TABLET | Freq: Every day | ORAL | 1 refills | Status: DC

## 2019-05-16 MED ORDER — FLUOXETINE HCL 40 MG PO CAPS: 40.0000 mg | ORAL_CAPSULE | Freq: Every day | ORAL | 0 refills | Status: DC

## 2019-05-16 NOTE — Patient Instructions (Signed)
1.Continuefluoxetine40 mg daily 2. Start Abilify 2 mg daily  3. Continue ativan 0.5 mg daily as needed for anxiety 4. Next appointment: in one month

## 2019-05-18 ENCOUNTER — Encounter: Payer: Self-pay | Admitting: Family Medicine

## 2019-05-18 ENCOUNTER — Other Ambulatory Visit: Payer: Self-pay

## 2019-05-18 ENCOUNTER — Ambulatory Visit (INDEPENDENT_AMBULATORY_CARE_PROVIDER_SITE_OTHER): Admitting: Family Medicine

## 2019-05-18 VITALS — BP 128/72 | HR 72 | Temp 98.1°F | Resp 14 | Ht 67.0 in | Wt 232.0 lb

## 2019-05-18 DIAGNOSIS — M791 Myalgia, unspecified site: Secondary | ICD-10-CM

## 2019-05-18 DIAGNOSIS — J45909 Unspecified asthma, uncomplicated: Secondary | ICD-10-CM | POA: Insufficient documentation

## 2019-05-18 DIAGNOSIS — J4540 Moderate persistent asthma, uncomplicated: Secondary | ICD-10-CM | POA: Diagnosis not present

## 2019-05-18 DIAGNOSIS — M7989 Other specified soft tissue disorders: Secondary | ICD-10-CM

## 2019-05-18 MED ORDER — BUDESONIDE-FORMOTEROL FUMARATE 80-4.5 MCG/ACT IN AERO: 2.0000 | INHALATION_SPRAY | Freq: Two times a day (BID) | RESPIRATORY_TRACT | 3 refills | Status: DC

## 2019-05-18 NOTE — Patient Instructions (Addendum)
Ultrasound to be done for next for blood clot  Take Xarelto once a day for blood thinner  Try the symbicort F/U pending results

## 2019-05-18 NOTE — Progress Notes (Signed)
Subjective:    Patient ID: Sharon Merritt, female    DOB: October 20, 1975, 43 y.o.   MRN: [id number]  Patient presents for Myalgias (R eye pain (stabbing), then soine feels hot, then joints will hurt excrutiating pain, then L calf to ankle will swell and hurt, and legs will feel heavy)  She continues to have severe myalgias where she gets so weak she cant move. Last visit autoimmune labs were negative. She has seen neurology a few years ago, with the same issues, had work up for MS that came back negative.  Pt will have episodes of pain that start in right eye, then she will get a headache, then starts having pain into her spne and lower back/hips then she gets gets heaviness/cramping all over .   She also noticed swelling in left calf last week, that has gone down, she feels like she has to lift her legs up to move them She will also have episodes of Right abd side pain tha twill ache, and she will have metallic taste in her mouth for a few minutes then it resolves She did improve after prednisone taper  I gave her in August  States she had episode last week, now slowly improving, her main concern is still swelling in pain in the left calf  Her sister is currently hospitalized but also found to have PE   August 2020 MRI - which was normal   PFT- uses albuterol a few times a week for SOB, her PFT did result in Asthma, cardiac work up was benign for the SOB  New meds: Abilify added Monday by psychiatry   Review Of Systems:  GEN- denies fatigue, fever, weight loss,weakness, recent illness HEENT- denies eye drainage, change in vision, nasal discharge, CVS- denies chest pain, palpitations RESP- denies SOB, cough, wheeze ABD- denies N/V, change in stools, abd pain GU- denies dysuria, hematuria, dribbling, incontinence MSK- denies joint pain, muscle aches, injury Neuro- denies headache, dizziness, syncope, seizure activity       Objective:    BP 128/72   Pulse 72   Temp 98.1 F (36.7  C) (Temporal)   Resp 14   Ht 5' 7"  (1.702 m)   Wt 232 lb (105.2 kg)   SpO2 98%   BMI 36.34 kg/m  GEN- NAD, alert and oriented x3 HEENT- PERRL, EOMI, non injected sclera, pink conjunctiva, MMM, oropharynx clear Neck- Supple, no thyromegaly CVS- RRR, no murmur RESP-CTAB ABD-NABS,soft,NT,ND NEURO-CNII-XII in tact,no focal deficits  EXT- swelling and mild tenderness left leg, left calf 1.5cm larger than right, neg homan's, no effusion knee, ankle bilat  Pulses- Radial, DP- 2+        Assessment & Plan:       Problem List Items Addressed This Visit      Unprioritized   Asthma    Uncontrolled symptoms, start symbicort 2 puffs BID, prn albuterol      Relevant Medications   budesonide-formoterol (SYMBICORT) 80-4.5 MCG/ACT inhaler   Myalgia    Chronic myalgia's worse than she feels what her typical fibromyalgia feels like and her flares all start the same with the eye symptoms first She has seen neurology, she follows with chiropracter I cant find anything in particular on labs  Over the years she has been on Lyrica, gabapentin, cymbalta, zoloft, tramadol for these pains Will see if rheumatology can weigh in      Relevant Orders   Ambulatory referral to Rheumatology    Other Visit Diagnoses    Left leg  swelling    -  Primary   PLan for [place] to r/u DVT but pt can not go until next week due to work, will start Xarelto 27m once a day,( we had samples) [place] be done next week      Note: This dictation was prepared with Dragon dictation along with smaller phrase technology. Any transcriptional errors that result from this process are unintentional.

## 2019-05-19 ENCOUNTER — Encounter: Payer: Self-pay | Admitting: Family Medicine

## 2019-05-19 ENCOUNTER — Telehealth: Payer: Self-pay | Admitting: *Deleted

## 2019-05-19 DIAGNOSIS — J209 Acute bronchitis, unspecified: Secondary | ICD-10-CM

## 2019-05-19 DIAGNOSIS — M791 Myalgia, unspecified site: Secondary | ICD-10-CM | POA: Insufficient documentation

## 2019-05-19 NOTE — Telephone Encounter (Signed)
Received call from pharmacy.   Reports that Symbicort is not covered by insurance. States that she has T[employer] and is not eligible to use coupon.   States Advair HFA and Advair Discus is preferred.   MD please advise.

## 2019-05-19 NOTE — Assessment & Plan Note (Signed)
Uncontrolled symptoms, start symbicort 2 puffs BID, prn albuterol

## 2019-05-19 NOTE — Assessment & Plan Note (Addendum)
Chronic myalgia's worse than she feels what her typical fibromyalgia feels like and her flares all start the same with the eye symptoms first She has seen neurology, she follows with chiropracter I cant find anything in particular on labs  Over the years she has been on Lyrica, gabapentin, cymbalta, zoloft, tramadol for these pains Will see if rheumatology can weigh in

## 2019-05-20 MED ORDER — ALBUTEROL SULFATE HFA 108 (90 BASE) MCG/ACT IN AERS: 2.0000 | INHALATION_SPRAY | Freq: Four times a day (QID) | RESPIRATORY_TRACT | 11 refills | Status: DC | PRN

## 2019-05-20 MED ORDER — ADVAIR HFA 115-21 MCG/ACT IN AERO: 2.0000 | INHALATION_SPRAY | Freq: Two times a day (BID) | RESPIRATORY_TRACT | 12 refills | Status: DC

## 2019-05-20 NOTE — Addendum Note (Signed)
Addended by: Sheral Flow on: 05/20/2019 10:37 AM   Modules accepted: Orders

## 2019-05-20 NOTE — Telephone Encounter (Signed)
Patient returned call and made aware.

## 2019-05-20 NOTE — Telephone Encounter (Signed)
Advair HFA sent to pharmacy 2 puffs BID, use the same way as symbicort

## 2019-05-20 NOTE — Addendum Note (Signed)
Addended by: Vic Blackbird F on: 05/20/2019 07:33 AM   Modules accepted: Orders

## 2019-05-20 NOTE — Telephone Encounter (Signed)
Call placed to patient. [place].

## 2019-05-25 ENCOUNTER — Other Ambulatory Visit: Payer: Self-pay

## 2019-05-25 ENCOUNTER — Ambulatory Visit (HOSPITAL_COMMUNITY): Admitting: Psychiatry

## 2019-05-25 ENCOUNTER — Ambulatory Visit (HOSPITAL_COMMUNITY)
Admission: RE | Admit: 2019-05-25 | Discharge: 2019-05-25 | Disposition: A | Discharge status: Home / Self Care | Source: Ambulatory Visit | Attending: Family Medicine | Admitting: Family Medicine

## 2019-05-25 DIAGNOSIS — M7989 Other specified soft tissue disorders: Secondary | ICD-10-CM | POA: Insufficient documentation

## 2019-06-15 ENCOUNTER — Ambulatory Visit (HOSPITAL_COMMUNITY): Admitting: Psychiatry

## 2019-06-17 ENCOUNTER — Ambulatory Visit: Admitting: Psychiatry

## 2019-06-20 ENCOUNTER — Ambulatory Visit
Admission: EM | Admit: 2019-06-20 | Discharge: 2019-06-20 | Disposition: A | Discharge status: Home / Self Care | Attending: Emergency Medicine | Admitting: Emergency Medicine

## 2019-06-20 ENCOUNTER — Other Ambulatory Visit: Payer: Self-pay

## 2019-06-20 DIAGNOSIS — R519 Headache, unspecified: Secondary | ICD-10-CM | POA: Diagnosis not present

## 2019-06-20 DIAGNOSIS — J029 Acute pharyngitis, unspecified: Secondary | ICD-10-CM | POA: Diagnosis present

## 2019-06-20 DIAGNOSIS — Z20822 Contact with and (suspected) exposure to covid-19: Secondary | ICD-10-CM

## 2019-06-20 DIAGNOSIS — Z20828 Contact with and (suspected) exposure to other viral communicable diseases: Secondary | ICD-10-CM | POA: Diagnosis present

## 2019-06-20 DIAGNOSIS — R0981 Nasal congestion: Secondary | ICD-10-CM

## 2019-06-20 LAB — POCT RAPID STREP A (OFFICE): Rapid Strep A Screen: NEGATIVE

## 2019-06-20 MED ORDER — AEROCHAMBER PLUS MISC: 2 refills | Status: DC

## 2019-06-20 MED ORDER — ONDANSETRON 8 MG PO TBDP: ORAL_TABLET | ORAL | 0 refills | Status: DC

## 2019-06-20 MED ORDER — MOMETASONE FUROATE 50 MCG/ACT NA SUSP: 2.0000 | Freq: Every day | NASAL | 0 refills | Status: DC

## 2019-06-20 NOTE — ED Provider Notes (Signed)
.  HPI  SUBJECTIVE:  Patient reports sore throat starting 1 week ago.. Sx worse with nothing.  Sx better with ibuprofen 600 mg every morning. Has also eating cold foods, using Hall's. no Fever + Swollen neck glands-left-sided lymphadenopathy starting yesterday   No neck stiffness  + Cough-reports shortness of breath described as rerquiring increased effort and wheezing starting today.  She denies chest pain. + nasal congestion, rhinorrhea, post nasal drip No Myalgias + mild Headache + fatigue No Rash  No loss of taste or smell No nausea, vomiting No diarrhea + right sided abdominal pain     + Recent Strep exposure. no mono, COVID exposure No reflux sxs No Allergy sxs  No Breathing difficulty, voice changes, sensation of throat swelling shut No Drooling No Trismus No abx in past month.  No antipyretic in past 4-6 hrs  Patient has a past medical history of GERD which she states is not bothering her, asthma, hypertension, fibromyalgia, mono/EBV, Lyme disease, Lemierre's syndrome.  No history of diabetes, chronic kidney disease.  LMP: Bilateral tubal ligation denies the possibility of being pregnant.  [id number], Modena Nunnery, MD    Past Medical History:  Diagnosis Date  . Breast discharge   . [name] infection   . Essential hypertension   . Fibromyalgia   . Lyme disease   . [place] spotted fever     Past Surgical History:  Procedure Laterality Date  . TUBAL LIGATION      Family History  Problem Relation Age of Onset  . COPD Mother   . Depression Mother   . Diabetes Father   . Hypertension Father   . Stroke Father   . Bipolar disorder Sister   . Post-traumatic stress disorder Sister   . Alcohol abuse Sister   . Drug abuse Sister   . Pulmonary embolism Sister   . Bipolar disorder Cousin   . Schizophrenia Cousin     Social History   Tobacco Use  . Smoking status: Former Smoker    Types: Cigarettes    Quit date: 02/14/2009    Years since  quitting: 10.3  . Smokeless tobacco: Never Used  Substance Use Topics  . Alcohol use: Not Currently    Alcohol/week: 0.0 standard drinks    Comment: Rarely   . Drug use: No    No current facility-administered medications for this encounter.  Current Outpatient Medications:  .  albuterol (VENTOLIN HFA) 108 (90 Base) MCG/ACT inhaler, Inhale 2 puffs into the lungs every 6 (six) hours as needed for wheezing or shortness of breath., Disp: 18 g, Rfl: 11 .  ARIPiprazole (ABILIFY) 2 MG tablet, Take 1 tablet (2 mg total) by mouth daily., Disp: 30 tablet, Rfl: 1 .  aspirin EC 81 MG tablet, Take 81 mg by mouth daily., Disp: , Rfl:  .  budesonide-formoterol (SYMBICORT) 80-4.5 MCG/ACT inhaler, Inhale 2 puffs into the lungs 2 (two) times daily., Disp: 1 Inhaler, Rfl: 3 .  FLUoxetine (PROZAC) 40 MG capsule, Take 1 capsule (40 mg total) by mouth daily., Disp: 90 capsule, Rfl: 0 .  fluticasone-salmeterol (ADVAIR HFA) 115-21 MCG/ACT inhaler, Inhale 2 puffs into the lungs 2 (two) times daily., Disp: 1 Inhaler, Rfl: 12 .  LORazepam (ATIVAN) 0.5 MG tablet, TAKE 1 TABLET BY MOUTH 2 TIMES DAILY AS NEEDED FOR ANXIETY., Disp: 45 tablet, Rfl: 1 .  mometasone (NASONEX) 50 MCG/ACT nasal spray, Place 2 sprays into the nose daily., Disp: 17 g, Rfl: 0 .  nitroGLYCERIN (NITROSTAT) 0.4 MG SL  tablet, Place 1 tablet (0.4 mg total) under the tongue every 5 (five) minutes as needed for chest pain. (Patient not taking: Reported on 05/18/2019), Disp: 10 tablet, Rfl: 1 .  ondansetron (ZOFRAN ODT) 8 MG disintegrating tablet, 1/2- 1 tablet q 8 hr prn nausea, vomiting, Disp: 20 tablet, Rfl: 0 .  Spacer/Aero-Holding Chambers (AEROCHAMBER PLUS) inhaler, Use as instructed, Disp: 1 each, Rfl: 2 .  valACYclovir (VALTREX) 1000 MG tablet, Take 1,000 mg by mouth daily as needed. , Disp: , Rfl:   No Known Allergies   ROS  As noted in HPI.   Physical Exam  BP 127/83 (BP Location: Right Arm)   Pulse 92   Temp 98.6 F (37 C) (Oral)    Resp 18   SpO2 95%   Constitutional: Well developed, well nourished, no acute distress Eyes:  EOMI, conjunctiva normal bilaterally HENT: Normocephalic, atraumatic,mucus membranes moist. +nasal congestion, irritated friable nasal mucosa left side.  No maxillary frontal sinus tenderness.  - erythematous oropharynx  - enlarged tonsils - exudates. Uvula midline.  No obvious postnasal drip. Respiratory: Normal inspiratory effort, lungs clear bilaterally, good air movement Cardiovascular: Normal rate, no murmurs, rubs, gallops GI: nondistended, nontender. No appreciable splenomegaly skin: No rash, skin intact Lymph: Single tender left-sided anterior cervical lymph node.    No posterior cervical lymphadenopathy Musculoskeletal: no deformities Neurologic: Alert & oriented x 3, no focal neuro deficits Psychiatric: Speech and behavior appropriate.  ED Course   Medications - No data to display  Orders Placed This Encounter  Procedures  . Novel Coronavirus, NAA ([facility])    Standing Status:   Standing    Number of Occurrences:   1  . Culture, group A strep    Standing Status:   Standing    Number of Occurrences:   1  . POCT rapid strep A    Standing Status:   Standing    Number of Occurrences:   1    Results for orders placed or performed during the hospital encounter of 06/20/19 (from the past 24 hour(s))  POCT rapid strep A     Status: None   Collection Time: 06/20/19 10:58 AM  Result Value Ref Range   Rapid Strep A Screen Negative Negative   No results found.  ED Clinical Impression  1. Sore throat   2. Encounter for laboratory testing for COVID-19 virus      ED Assessment/Plan   Rapid strep negative. Obtaining throat culture to guide antibiotic treatment.  Covid PCR sent.  Will send home with supportive treatment Zofran for nausea, spacer for her albuterol inhaler.  She will use her albuterol inhaler on a regular basis for the next 4 days.  1 to 2 puffs every 4 hours for  2 days, then every 6 hours for 2 days, then as needed.  Benadryl/Maalox mixture.  She states that she does not need a prescription for albuterol.  Nasonex for the nasal congestion-  if this is too expensive she may use Flonase, saline nasal irrigation.  Discussed this with patient. We'll contact her if culture is positive, and will call in Appropriate antibiotics.  Patient to followup with PMD when necessary, to the ER if she gets worse.  Covid PCR negative.  Discussed labs,  MDM, plan and followup with patient. Discussed sn/sx that should prompt return to the ED. patient agrees with plan.   Meds ordered this encounter  Medications  . Spacer/Aero-Holding Chambers (AEROCHAMBER PLUS) inhaler    Sig: Use as instructed  Dispense:  1 each    Refill:  2  . mometasone (NASONEX) 50 MCG/ACT nasal spray    Sig: Place 2 sprays into the nose daily.    Dispense:  17 g    Refill:  0  . ondansetron (ZOFRAN ODT) 8 MG disintegrating tablet    Sig: 1/2- 1 tablet q 8 hr prn nausea, vomiting    Dispense:  20 tablet    Refill:  0     *This clinic note was created using Lobbyist. Therefore, there may be occasional mistakes despite careful proofreading.     Melynda Ripple, MD 06/22/19 1212

## 2019-06-20 NOTE — ED Triage Notes (Signed)
Pt presents to UC w/ c/o sore throat x1 week. Pt states she also started coughing and nasal drainage x2 weeks.

## 2019-06-20 NOTE — Discharge Instructions (Addendum)
your rapid strep was negative today, so we have sent off a throat culture.  We will contact you and call in the appropriate antibiotics if your culture comes back positive for an infection requiring antibiotic treatment.  Give [place] a working phone number.  Your Covid PCR is pending.  .  1 gram of Tylenol and 600 mg ibuprofen together 3-4 times a day as needed for pain.  Make sure you drink plenty of extra fluids.  Some people find salt water gargles and  Traditional Medicinal's "Throat Coat" tea helpful. Take 5 mL of liquid Benadryl and 5 mL of Maalox. Mix it together, and then hold it in your mouth for as long as you can and then swallow. You may do this 4 times a day.    Saline nasal irrigation with a Milta Deiters med rinse and distilled water as often as you want., Nasonex/mometasone nasal spray.  If the Nasonex is too expensive, Flonase is fine.  Zofran as needed for nausea.  Regularly scheduled albuterol for the next 4 days.  1 to 2 puffs every 4 hours for 2 days, then every 6 hours for 2 days, then use as needed.  Go to www.goodrx.com to look up your medications. This will give you a list of where you can find your prescriptions at the most affordable prices. Or ask the pharmacist what the cash price is, or if they have any other discount programs available to help make your medication more affordable. This can be less expensive than what you would pay with insurance.

## 2019-06-22 LAB — NOVEL CORONAVIRUS, NAA: SARS-CoV-2, NAA: NOT DETECTED

## 2019-06-23 LAB — CULTURE, GROUP A STREP (THRC)

## 2019-06-27 ENCOUNTER — Other Ambulatory Visit: Payer: Self-pay

## 2019-06-28 ENCOUNTER — Other Ambulatory Visit: Payer: Self-pay

## 2019-06-28 ENCOUNTER — Ambulatory Visit
Admission: EM | Admit: 2019-06-28 | Discharge: 2019-06-28 | Disposition: A | Discharge status: Home / Self Care | Attending: Emergency Medicine | Admitting: Emergency Medicine

## 2019-06-28 DIAGNOSIS — Z20828 Contact with and (suspected) exposure to other viral communicable diseases: Secondary | ICD-10-CM

## 2019-06-28 DIAGNOSIS — R05 Cough: Secondary | ICD-10-CM | POA: Diagnosis not present

## 2019-06-28 DIAGNOSIS — Z20822 Contact with and (suspected) exposure to covid-19: Secondary | ICD-10-CM

## 2019-06-28 DIAGNOSIS — R0981 Nasal congestion: Secondary | ICD-10-CM | POA: Diagnosis not present

## 2019-06-28 MED ORDER — BENZONATATE 100 MG PO CAPS: 100.0000 mg | ORAL_CAPSULE | Freq: Three times a day (TID) | ORAL | 0 refills | Status: DC

## 2019-06-28 MED ORDER — FLUTICASONE PROPIONATE 50 MCG/ACT NA SUSP: 1.0000 | Freq: Every day | NASAL | 0 refills | Status: DC

## 2019-06-28 NOTE — ED Provider Notes (Addendum)
[facility]    CSN: [id number] Arrival date & time: 06/28/19  1111      History   Chief Complaint Chief Complaint  Patient presents with  . Nasal Congestion    HPI Sharon Merritt is a 43 y.o. female.   Sharon Merritt 43 years old female presented to the urgent care with a complaint of Covid exposure and nasal congestion, cough for the past 4 days. Patient was exposed to his boss at work 5 days ago.. denies sick exposure to flu or strep.  Denies recent travel.  Denies aggravating or alleviating symptoms.  Denies previous COVID infection.   Denies fever, chills, fatigue, rhinorrhea, sore throat,  SOB, wheezing, chest pain, nausea, vomiting, changes in bowel or bladder habits.    The history is provided by the patient. No language interpreter was used.    Past Medical History:  Diagnosis Date  . Breast discharge   . [name] infection   . Essential hypertension   . Fibromyalgia   . Lyme disease   . [place] spotted fever     Patient Active Problem List   Diagnosis Date Noted  . Myalgia 05/19/2019  . Asthma 05/18/2019  . MDD (major depressive disorder) 10/25/2018  . GAD (generalized anxiety disorder) 10/25/2018  . Chronic insomnia 10/25/2018  . DDD (degenerative disc disease), lumbar 10/09/2014  . Fatigue 10/09/2014  . Abnormal involuntary movement 02/17/2013  . Fibromyalgia   . Hypertension   . [facility] spotted fever   . Chest tightness 02/14/2013  . Obesity 02/14/2013  . GERD (gastroesophageal reflux disease) 02/14/2013    Past Surgical History:  Procedure Laterality Date  . TUBAL LIGATION      OB History    Gravida      Para      Term      Preterm      AB      Living  3     SAB      TAB      Ectopic      Multiple      Live Births               Home Medications    Prior to Admission medications   Medication Sig Start Date End Date Taking? Authorizing Provider  albuterol (VENTOLIN HFA) 108 (90 Base)  MCG/ACT inhaler Inhale 2 puffs into the lungs every 6 (six) hours as needed for wheezing or shortness of breath. 05/20/19   Alycia Rossetti, MD  ARIPiprazole (ABILIFY) 2 MG tablet Take 1 tablet (2 mg total) by mouth daily. 05/16/19   Norman Clay, MD  aspirin EC 81 MG tablet Take 81 mg by mouth daily.    [provider]  benzonatate (TESSALON) 100 MG capsule Take 1 capsule (100 mg total) by mouth every 8 (eight) hours. 06/28/19   Kennet Mccort, Darrelyn Hillock, FNP  budesonide-formoterol (SYMBICORT) 80-4.5 MCG/ACT inhaler Inhale 2 puffs into the lungs 2 (two) times daily. 05/18/19   Alycia Rossetti, MD  FLUoxetine (PROZAC) 40 MG capsule Take 1 capsule (40 mg total) by mouth daily. 06/03/19   Norman Clay, MD  fluticasone (FLONASE) 50 MCG/ACT nasal spray Place 1 spray into both nostrils daily for 14 days. 06/28/19 07/12/19  Madalin Hughart, Darrelyn Hillock, FNP  fluticasone-salmeterol (ADVAIR HFA) 938-10 MCG/ACT inhaler Inhale 2 puffs into the lungs 2 (two) times daily. 05/20/19   [place], Modena Nunnery, MD  LORazepam (ATIVAN) 0.5 MG tablet TAKE 1 TABLET BY MOUTH 2 TIMES DAILY AS  NEEDED FOR ANXIETY. 02/18/19   Alycia Rossetti, MD  mometasone (NASONEX) 50 MCG/ACT nasal spray Place 2 sprays into the nose daily. 06/20/19   Melynda Ripple, MD  nitroGLYCERIN (NITROSTAT) 0.4 MG SL tablet Place 1 tablet (0.4 mg total) under the tongue every 5 (five) minutes as needed for chest pain. Patient not taking: Reported on 05/18/2019 02/09/19   Alycia Rossetti, MD  ondansetron [facility] ODT) 8 MG disintegrating tablet 1/2- 1 tablet q 8 hr prn nausea, vomiting 06/20/19   Melynda Ripple, MD  Spacer/Aero-Holding Chambers (AEROCHAMBER PLUS) inhaler Use as instructed 06/20/19   Melynda Ripple, MD  valACYclovir (VALTREX) 1000 MG tablet Take 1,000 mg by mouth daily as needed.     [provider]    Family History Family History  Problem Relation Age of Onset  . COPD Mother   . Depression Mother   . Diabetes Father     . Hypertension Father   . Stroke Father   . Bipolar disorder Sister   . Post-traumatic stress disorder Sister   . Alcohol abuse Sister   . Drug abuse Sister   . Pulmonary embolism Sister   . Bipolar disorder Cousin   . Schizophrenia Cousin     Social History Social History   Tobacco Use  . Smoking status: Former Smoker    Types: Cigarettes    Quit date: 02/14/2009    Years since quitting: 10.3  . Smokeless tobacco: Never Used  Substance Use Topics  . Alcohol use: Not Currently    Alcohol/week: 0.0 standard drinks    Comment: Rarely   . Drug use: No     Allergies   Patient has no known allergies.   Review of Systems Review of Systems  Constitutional: Negative.   HENT: Positive for congestion.   Respiratory: Positive for cough.   Cardiovascular: Negative.   Gastrointestinal: Negative.   Neurological: Negative.   RODS: All other are negatives   Physical Exam Triage Vital Signs ED Triage Vitals  Enc Vitals Group     BP      Pulse      Resp      Temp      Temp src      SpO2      Weight      Height      Head Circumference      Peak Flow      Pain Score      Pain Loc      Pain Edu?      Excl. in [place]?    No data found.  Updated Vital Signs BP 124/82 (BP Location: Right Arm)   Pulse 73   Temp 97.9 F (36.6 C) (Oral)   Resp 16   SpO2 96%   Visual Acuity Right Eye Distance:   Left Eye Distance:   Bilateral Distance:    Right Eye Near:   Left Eye Near:    Bilateral Near:     Physical Exam Vitals and nursing note reviewed.  Constitutional:      General: She is not in acute distress.    Appearance: Normal appearance. She is normal weight. She is not ill-appearing or toxic-appearing.  HENT:     Head: Normocephalic.     Right Ear: Tympanic membrane, ear canal and external ear normal. There is no impacted cerumen.     Left Ear: Tympanic membrane, ear canal and external ear normal. There is no impacted cerumen.     Nose: Nose normal. No  congestion.     Mouth/Throat:     Mouth: Mucous membranes are moist.     Pharynx: No oropharyngeal exudate or posterior oropharyngeal erythema.  Cardiovascular:     Rate and Rhythm: Normal rate and regular rhythm.     Pulses: Normal pulses.     Heart sounds: Normal heart sounds. No murmur.  Pulmonary:     Effort: Pulmonary effort is normal. No respiratory distress.     Breath sounds: No wheezing or rhonchi.  Chest:     Chest wall: No tenderness.  Abdominal:     General: Abdomen is flat. Bowel sounds are normal. There is no distension.     Palpations: There is no mass.  Skin:    Capillary Refill: Capillary refill takes less than 2 seconds.  Neurological:     Mental Status: She is alert and oriented to person, place, and time.      UC Treatments / Results  Labs (all labs ordered are listed, but only abnormal results are displayed) Labs Reviewed  NOVEL CORONAVIRUS, NAA    EKG   Radiology No results found.  Procedures Procedures (including critical care time)  Medications Ordered in UC Medications - No data to display  Initial Impression / Assessment and Plan / UC Course  I have reviewed the triage vital signs and the nursing notes.  Pertinent labs & imaging results that were available during my care of the patient were reviewed by me and considered in my medical decision making (see chart for details).   Patient stable for discharge.  Benign physical exam.  Advised patient to quarantine until COVID-19 result become available. Work note was given.  To return for worsening of symptoms.  Patient verbalized an nderstanding of the plan of care.  Final Clinical Impressions(s) / UC Diagnoses   Final diagnoses:  Exposure to COVID-19 virus     Discharge Instructions     COVID testing ordered.  It will take between 2-7 days for test results.  Someone will contact you regarding abnormal results.    In the meantime: You should remain isolated in your home for 10 days  from symptom onset  Get plenty of rest and push fluids Use medications daily for symptom relief Use OTC medications like ibuprofen or tylenol as needed fever or pain Call or go to the ED if you have any new or worsening symptoms such as fever, worsening cough, shortness of breath, chest tightness, chest pain, turning blue, changes in mental status, etc...      ED Prescriptions    Medication Sig Dispense Auth. Provider   benzonatate (TESSALON) 100 MG capsule Take 1 capsule (100 mg total) by mouth every 8 (eight) hours. 21 capsule Desha Bitner S, FNP   fluticasone (FLONASE) 50 MCG/ACT nasal spray Place 1 spray into both nostrils daily for 14 days. 16 g Emerson Monte, FNP     PDMP not reviewed this encounter.   Emerson Monte, FNP 06/28/19 1201    Emerson Monte, FNP 06/28/19 1201

## 2019-06-28 NOTE — ED Triage Notes (Signed)
Pt presents with nasal congestion and has had positive exposure to covid

## 2019-06-28 NOTE — Discharge Instructions (Addendum)
COVID testing ordered.  It will take between 2-7 days for test results.  Someone will contact you regarding abnormal results.    In the meantime: You should remain isolated in your home for 10 days from symptom onset  Get plenty of rest and push fluids Use medications daily for symptom relief Use OTC medications like ibuprofen or tylenol as needed fever or pain Call or go to the ED if you have any new or worsening symptoms such as fever, worsening cough, shortness of breath, chest tightness, chest pain, turning blue, changes in mental status, etc..Marland Kitchen

## 2019-06-29 LAB — NOVEL CORONAVIRUS, NAA: SARS-CoV-2, NAA: DETECTED — AB

## 2019-06-30 ENCOUNTER — Telehealth (HOSPITAL_COMMUNITY): Payer: Self-pay | Admitting: Emergency Medicine

## 2019-06-30 NOTE — Telephone Encounter (Signed)
Your test for COVID-19 was positive, meaning that you were infected with the novel coronavirus and could give the germ to others.  Please continue isolation at home for at least 10 days since the start of your symptoms. If you do not have symptoms, please isolate at home for 10 days from the day you were tested. Once you complete your 10 day quarantine, you may return to normal activities as long as you've not had a fever for over 24 hours(without taking fever reducing medicine) and your symptoms are improving. Please continue good preventive care measures, including:  frequent hand-washing, avoid touching your face, cover coughs/sneezes, stay out of crowds and keep a 6 foot distance from others.  Go to the nearest hospital emergency room if fever/cough/breathlessness are severe or illness seems like a threat to life.  Patient contacted by phone and made aware of    results. Pt verbalized understanding and had all questions answered.

## 2019-07-06 ENCOUNTER — Other Ambulatory Visit: Payer: Self-pay | Admitting: *Deleted

## 2019-07-06 MED ORDER — ADVAIR HFA 115-21 MCG/ACT IN AERO: 2.0000 | INHALATION_SPRAY | Freq: Two times a day (BID) | RESPIRATORY_TRACT | 3 refills | Status: DC

## 2019-07-13 ENCOUNTER — Ambulatory Visit (INDEPENDENT_AMBULATORY_CARE_PROVIDER_SITE_OTHER): Admitting: Family Medicine

## 2019-07-13 ENCOUNTER — Encounter: Payer: Self-pay | Admitting: Psychiatry

## 2019-07-13 ENCOUNTER — Encounter: Payer: Self-pay | Admitting: Family Medicine

## 2019-07-13 ENCOUNTER — Ambulatory Visit (INDEPENDENT_AMBULATORY_CARE_PROVIDER_SITE_OTHER): Admitting: Psychiatry

## 2019-07-13 ENCOUNTER — Other Ambulatory Visit: Payer: Self-pay

## 2019-07-13 VITALS — BP 120/74 | HR 86 | Temp 98.7°F | Resp 18 | Ht 67.0 in | Wt 239.8 lb

## 2019-07-13 DIAGNOSIS — Z23 Encounter for immunization: Secondary | ICD-10-CM

## 2019-07-13 DIAGNOSIS — F3181 Bipolar II disorder: Secondary | ICD-10-CM

## 2019-07-13 DIAGNOSIS — Z0000 Encounter for general adult medical examination without abnormal findings: Secondary | ICD-10-CM | POA: Diagnosis not present

## 2019-07-13 DIAGNOSIS — Z124 Encounter for screening for malignant neoplasm of cervix: Secondary | ICD-10-CM | POA: Diagnosis not present

## 2019-07-13 DIAGNOSIS — Z6837 Body mass index (BMI) 37.0-37.9, adult: Secondary | ICD-10-CM

## 2019-07-13 DIAGNOSIS — Z1231 Encounter for screening mammogram for malignant neoplasm of breast: Secondary | ICD-10-CM | POA: Diagnosis not present

## 2019-07-13 MED ORDER — ARIPIPRAZOLE 5 MG PO TABS: 5.0000 mg | ORAL_TABLET | Freq: Every day | ORAL | 1 refills | Status: DC

## 2019-07-13 MED ORDER — DOXEPIN HCL 25 MG PO CAPS: ORAL_CAPSULE | ORAL | 1 refills | Status: DC

## 2019-07-13 NOTE — Progress Notes (Signed)
BH MD/PA/NP OP Progress Note   I connected with  [employer] on 07/13/19 by a video enabled telemedicine application and verified that I am speaking with the correct person using two identifiers.   I discussed the limitations of evaluation and management by telemedicine. The patient expressed understanding and agreed to proceed.   07/13/2019 1:43 PM Sharon Merritt  MRN:  [id number]  Chief Complaint: " I think some thing is wrong."   HPI: Patient reported that for the past few weeks she feels she has too much energy and her mood is somewhat elated.  She does not feel tired and feels like she has a lot of energy.  She also has not been sleeping much during the night.  She feels that she so much energy that she does not need to sleep.  She feels that she has been more impulsive and has been making irrational decisions.  She has been buying things that she does not need and is able to rationalize the need to buy those things.  She has been signing up for different things that she never would even be interested in. She stated that she believes all this started after she started taking Abilify.  She informs she is to be on a higher dose of Prozac 60 mg and that was helping her dose was dropped down to 40 mg and then eventually Abilify was added.  She denied having any such symptoms in the past. She denies any suicidal ideations or homicidal ideations.  Visit Diagnosis:    ICD-10-CM   1. Bipolar 2 disorder (HCC)  F31.81 ARIPiprazole (ABILIFY) 5 MG tablet    doxepin (SINEQUAN) 25 MG capsule    Past Psychiatric History: MDD  Past Medical History:  Past Medical History:  Diagnosis Date  . Breast discharge   . [name] infection   . Essential hypertension   . Fibromyalgia   . Lyme disease   . Rocky Mountain spotted fever     Past Surgical History:  Procedure Laterality Date  . TUBAL LIGATION      Family Psychiatric History: see below  Family History:  Family History  Problem  Relation Age of Onset  . COPD Mother   . Depression Mother   . Diabetes Father   . Hypertension Father   . Stroke Father   . Bipolar disorder Sister   . Post-traumatic stress disorder Sister   . Alcohol abuse Sister   . Drug abuse Sister   . Pulmonary embolism Sister   . Bipolar disorder Cousin   . Schizophrenia Cousin     Social History:  Social History   Socioeconomic History  . Marital status: Married    Spouse name: [name]   . Number of children: 3  . Years of education: Assoc   . Highest education level: Not on file  Occupational History    Employer: [employer]  Tobacco Use  . Smoking status: Former Smoker    Types: Cigarettes    Quit date: 02/14/2009    Years since quitting: 10.4  . Smokeless tobacco: Never Used  Substance and Sexual Activity  . Alcohol use: Not Currently    Alcohol/week: 0.0 standard drinks    Comment: Rarely   . Drug use: No  . Sexual activity: Not Currently    Birth control/protection: Surgical  Other Topics Concern  . Not on file  Social History Narrative   Patient lives at home with her husband [name]) and her children.    Patient works  full time.   Caffeine- Tea 3-4 times daily.   Patient is right-handed.   Patient has a Geophysicist/field seismologist.         Social Determinants of Health   Financial Resource Strain:   . Difficulty of Paying Living Expenses: Not on file  Food Insecurity:   . Worried About Charity fundraiser in the Last Year: Not on file  . Ran Out of Food in the Last Year: Not on file  Transportation Needs:   . Lack of Transportation (Medical): Not on file  . Lack of Transportation (Non-Medical): Not on file  Physical Activity:   . Days of Exercise per Week: Not on file  . Minutes of Exercise per Session: Not on file  Stress:   . Feeling of Stress : Not on file  Social Connections:   . Frequency of Communication with Friends and Family: Not on file  . Frequency of Social Gatherings with Friends and Family: Not  on file  . Attends Religious Services: Not on file  . Active Member of Clubs or Organizations: Not on file  . Attends Archivist Meetings: Not on file  . Marital Status: Not on file    Allergies: No Known Allergies  Metabolic Disorder Labs: No results found for: HGBA1C, MPG No results found for: PROLACTIN Lab Results  Component Value Date   CHOL 170 02/15/2013   TRIG 134 02/15/2013   HDL 43 02/15/2013   CHOLHDL 4.0 02/15/2013   VLDL 27 02/15/2013   LDLCALC 100 (H) 02/15/2013   Lab Results  Component Value Date   TSH 2.163 07/24/2015   TSH 3.039 10/09/2014    Therapeutic Level Labs: No results found for: LITHIUM No results found for: VALPROATE No components found for:  CBMZ  Current Medications: Current Outpatient Medications  Medication Sig Dispense Refill  . albuterol (VENTOLIN HFA) 108 (90 Base) MCG/ACT inhaler Inhale 2 puffs into the lungs every 6 (six) hours as needed for wheezing or shortness of breath. 18 g 11  . ARIPiprazole (ABILIFY) 5 MG tablet Take 1 tablet (5 mg total) by mouth daily. 30 tablet 1  . aspirin EC 81 MG tablet Take 81 mg by mouth daily.    . benzonatate (TESSALON) 100 MG capsule Take 1 capsule (100 mg total) by mouth every 8 (eight) hours. 21 capsule 0  . budesonide-formoterol (SYMBICORT) 80-4.5 MCG/ACT inhaler Inhale 2 puffs into the lungs 2 (two) times daily. 1 Inhaler 3  . doxepin (SINEQUAN) 25 MG capsule Take 1-2 capsules at bedtime for sleep 60 capsule 1  . FLUoxetine (PROZAC) 40 MG capsule Take 1 capsule (40 mg total) by mouth daily. 90 capsule 0  . fluticasone (FLONASE) 50 MCG/ACT nasal spray Place 1 spray into both nostrils daily for 14 days. 16 g 0  . fluticasone-salmeterol (ADVAIR HFA) 115-21 MCG/ACT inhaler Inhale 2 puffs into the lungs 2 (two) times daily. 3 Inhaler 3  . LORazepam (ATIVAN) 0.5 MG tablet TAKE 1 TABLET BY MOUTH 2 TIMES DAILY AS NEEDED FOR ANXIETY. 45 tablet 1  . mometasone (NASONEX) 50 MCG/ACT nasal spray Place  2 sprays into the nose daily. 17 g 0  . nitroGLYCERIN (NITROSTAT) 0.4 MG SL tablet Place 1 tablet (0.4 mg total) under the tongue every 5 (five) minutes as needed for chest pain. (Patient not taking: Reported on 05/18/2019) 10 tablet 1  . ondansetron (ZOFRAN ODT) 8 MG disintegrating tablet 1/2- 1 tablet q 8 hr prn nausea, vomiting 20 tablet 0  . Spacer/Aero-Holding  Chambers (AEROCHAMBER PLUS) inhaler Use as instructed 1 each 2  . valACYclovir (VALTREX) 1000 MG tablet Take 1,000 mg by mouth daily as needed.      No current facility-administered medications for this visit.     Musculoskeletal: Strength & Muscle Tone: unable to assess due to telemed visit Gait & Station: unable to assess due to telemed visit Patient leans: unable to assess due to telemed visit   Psychiatric Specialty Exam: Review of Systems  There were no vitals taken for this visit.There is no height or weight on file to calculate BMI.  General Appearance: Fairly Groomed  Eye Contact:  Good  Speech:  Clear and Coherent and Normal Rate  Volume:  Normal  Mood:  Euphoric  Affect:  Congruent  Thought Process:  Goal Directed, Linear and Descriptions of Associations: Intact  Orientation:  Full (Time, Place, and Person)  Thought Content: Logical   Suicidal Thoughts:  No  Homicidal Thoughts:  No  Memory:  Recent;   Good Remote;   Good  Judgement:  Fair  Insight:  Fair  Psychomotor Activity:  Normal  Concentration:  Concentration: Good and Attention Span: Fair  Recall:  Good  Fund of Knowledge: Good  Language: Good  Akathisia:  Negative  Handed:  Right  AIMS (if indicated): not done due to telemed visit  Assets:  Communication Skills Desire for Improvement Financial Resources/Insurance Housing  ADL's:  Intact  Cognition: WNL  Sleep:  Poor   Screenings: GAD-7     Office Visit from 11/24/2018 in [facility]  Total GAD-7 Score  18    PHQ2-9     Office Visit from 11/24/2018 in [facility] Office Visit from 12/18/2016 in [facility] Office Visit from 08/21/2016 in [facility] Office Visit from 10/09/2014 in [facility]  PHQ-2 Total Score  6  6  0  0  PHQ-9 Total Score  18  21  0  --       Assessment and Plan: Patient described hypomanic symptoms for the past few weeks which started after she started taking Abilify.  She is also not sleeping much.  Based on her hypomanic symptoms her diagnosis is being revised to bipolar disorder type II.  She is agreeable to increasing the dose of Abilify to 5 mg for optimal effect and adding doxepin to help her sleep.  1. Bipolar 2 disorder ([place]), MRE hypomanic  - Increase ARIPiprazole (ABILIFY) 5 MG tablet; Take 1 tablet (5 mg total) by mouth daily.  Dispense: 30 tablet; Refill: 1 - Start doxepin (SINEQUAN) 25 MG capsule; Take 1-2 capsules at bedtime for sleep  Dispense: 60 capsule; Refill: 1  F/up in 2 weeks for close monitoring.  Nevada Crane, MD 07/13/2019, 1:43 PM

## 2019-07-13 NOTE — Patient Instructions (Addendum)
Return for fasting labs on a Wed evening  Tetanus and flu shot given  Schedule your mammogram F/U 6 MONTHS

## 2019-07-13 NOTE — Progress Notes (Signed)
   Subjective:    Patient ID: Sharon Merritt, female    DOB: 1975/10/01, 44 y.o.   MRN: [id number]  Patient presents for Annual Exam  Patient here for CPE.  Medications and history reviewed  She is followed by psychiatry for her bipolar. She had appointment today. Abilifywas increased to 108m to help with manic portion, also given doxepin for the sleep   Due for tetanus/flu shot    COVID-19 infection 2 weeks ago with a positive test on 29 December  She had adenomyosis- last visit in 2018, due for PAP Smear , he has had procedures performed on her cervix is also had endometrial biopsy Mammogram due, last  2018  LMP- 2 weeks around 29th   No family history of colon cancer     Review Of Systems:  GEN- denies fatigue, fever, weight loss,weakness, recent illness HEENT- denies eye drainage, change in vision, nasal discharge, CVS- denies chest pain, palpitations RESP- denies SOB, cough, wheeze ABD- denies N/V, change in stools, abd pain GU- denies dysuria, hematuria, dribbling, incontinence MSK- denies joint pain, muscle aches, injury Neuro- denies headache, dizziness, syncope, seizure activity       Objective:    BP 120/74 (BP Location: Right Arm, Patient Position: Sitting, Cuff Size: Normal)   Pulse 86   Temp 98.7 F (37.1 C) (Oral)   Resp 18   Ht 5' 7"  (1.702 m)   Wt 239 lb 12.8 oz (108.8 kg)   SpO2 97%   BMI 37.56 kg/m  GEN- NAD, alert and oriented x3 HEENT- PERRL, EOMI, non injected sclera, pink conjunctiva, MMM, oropharynx clear Neck- Supple, no thyromegaly Breast- normal symmetry, no nipple inversion,no nipple drainage, no nodules or lumps felt Nodes- no axillary nodes CVS- RRR, no murmur RESP-CTAB ABD-NABS,soft,NT,ND GU- normal external genitalia, vaginal mucosa pink and moist, cervix visualized no growth, small blood form os/ scarring of cervix, no  discharge, no CMT, no ovarian masses, uterus normal size EXT- No edema Pulses- Radial, DP- 2+         Assessment & Plan:      Problem List Items Addressed This Visit      Unprioritized   Obesity (Chronic)    Other Visit Diagnoses    Routine general medical examination at a health care facility    -  Primary   CPE done.  Flu shot and tetanus given.  Patient to schedule mammogram.  Pap smear done.  She will return for fasting labs.  He is working on cutting down sweet tea and sugary beverages first to start with her nutrition changes.   Cervical cancer screening       Relevant Orders   Pap IG w/ reflex to HPV when [facility] ([employer]   Encounter for screening mammogram for malignant neoplasm of breast       Relevant Orders   MM SCREENING BREAST TOMO BILATERAL   Need for Tdap vaccination       Relevant Orders   Tdap vaccine greater than or equal to 7yo IM (Completed)   Need for immunization against influenza       Relevant Orders   Flu Vaccine QUAD 36+ mos IM (Completed)      Note: This dictation was prepared with Dragon dictation along with smaller phrase technology. Any transcriptional errors that result from this process are unintentional.

## 2019-07-17 ENCOUNTER — Other Ambulatory Visit: Payer: Self-pay | Admitting: Family Medicine

## 2019-07-18 LAB — PAP IG W/ RFLX HPV ASCU

## 2019-07-18 NOTE — Telephone Encounter (Signed)
Ok to refill??  Last office visit 07/13/2019.  Last refill 02/18/2019, #1 refills.

## 2019-07-20 ENCOUNTER — Other Ambulatory Visit

## 2019-07-20 ENCOUNTER — Ambulatory Visit (INDEPENDENT_AMBULATORY_CARE_PROVIDER_SITE_OTHER): Admitting: Psychiatry

## 2019-07-20 ENCOUNTER — Other Ambulatory Visit: Payer: Self-pay

## 2019-07-20 DIAGNOSIS — F330 Major depressive disorder, recurrent, mild: Secondary | ICD-10-CM | POA: Diagnosis not present

## 2019-07-20 DIAGNOSIS — Z0000 Encounter for general adult medical examination without abnormal findings: Secondary | ICD-10-CM

## 2019-07-20 NOTE — Progress Notes (Signed)
Virtual Visit via Video Note  I connected with Sharon Merritt on 07/20/19 at 3:15 PM EST by a video enabled telemedicine application and verified that I am speaking with the correct person using two identifiers.   I discussed the limitations of evaluation and management by telemedicine and the availability of in person appointments. The patient expressed understanding and agreed to proceed.    I provided 45 minutes of non-face-to-face time during this encounter.   [name], LCSW     THERAPIST PROGRESS NOTE  Session Time: Wednesday 07/20/2019 3:15 PM - 4:00 PM   Participation Level: Active  Behavioral Response: Alert, less anxious, less depressed  Type of Therapy: Individual Therapy  Treatment Goals addressed:  learn and implement cognitive and behavioral strategies to overcome depression and cope with anxiety  Interventions: CBT and Supportive  Summary: Charlita Brian is a 44 y.o. female who is referred for services by psychiatrist Dr. Modesta Messing due to patient experiencing symptoms of anxiety and depression. She denies any psychiatric hospitalizations and any previous involvement in outpatient therapy.  Patient reports experiencing anxiety and depression intermittently since high school.  Current depressive symptoms started last year and include social withdrawal, excessive sleeping, deep sadness, and crying spells.  She reports constant worry about the safety of her family and pets and constant thoughts of "what if".  Patient last was seen via virtual visit 2 months ago.   She reports having some hiccups with medication but has talked with psychiatrist and reports these have been resolved.  She also reports she, her husband, and her daughter had Covid 80 but all have recovered. She reports improved mood and increased behavioral activation since last session.  She reports no longer isolating but now spending time with her family.  She also has been using a decal machine to do crafts.   She states feeling more confident and self assured.  She still desires to go into management on her job and has begun the application process.  However she continues to experience negative thoughts about self and capabilities.  This causes stress and anxiety.  Suicidal/Homicidal: Nowithout intent/plan  Therapist Response:, reviewed symptoms, praised and reinforced patient's increased behavioral activation and social involvement,discussed effects, praised and reinforced patient's efforts to begin the application process for a new position, assisted patient began to identify and examine her thought patterns, began to assist patient identify the connection between her thoughts/mood/behavior, began to orient patient to CBT using examples in her life, therapist will send patient handouts on CBT model and CBT distortions in preparation for next session   Plan: Return again in 2 weeks.  Diagnosis: Axis I: MDD, mild    Axis II: No diagnosis    [name], LCSW 07/20/2019

## 2019-07-21 LAB — CBC WITH DIFFERENTIAL/PLATELET
Absolute Monocytes: 730 cells/uL (ref 200–950)
Basophils Absolute: 47 cells/uL (ref 0–200)
Basophils Relative: 0.7 %
Eosinophils Absolute: 107 cells/uL (ref 15–500)
Eosinophils Relative: 1.6 %
HCT: 38.9 % (ref 35.0–45.0)
Hemoglobin: 13 g/dL (ref 11.7–15.5)
Lymphs Abs: 2057 cells/uL (ref 850–3900)
MCH: 31 pg (ref 27.0–33.0)
MCHC: 33.4 g/dL (ref 32.0–36.0)
MCV: 92.8 fL (ref 80.0–100.0)
MPV: 10.4 fL (ref 7.5–12.5)
Monocytes Relative: 10.9 %
Neutro Abs: 3759 cells/uL (ref 1500–7800)
Neutrophils Relative %: 56.1 %
Platelets: 264 10*3/uL (ref 140–400)
RBC: 4.19 10*6/uL (ref 3.80–5.10)
RDW: 12.6 % (ref 11.0–15.0)
Total Lymphocyte: 30.7 %
WBC: 6.7 10*3/uL (ref 3.8–10.8)

## 2019-07-21 LAB — LIPID PANEL
Cholesterol: 216 mg/dL — ABNORMAL HIGH (ref <200)
HDL: 65 mg/dL (ref >=50)
LDL Cholesterol (Calc): 128 mg/dL (calc) — ABNORMAL HIGH
Non-HDL Cholesterol (Calc): 151 mg/dL (calc) — ABNORMAL HIGH (ref <130)
Total CHOL/HDL Ratio: 3.3 (calc) (ref <5.0)
Triglycerides: 123 mg/dL (ref <150)

## 2019-07-21 LAB — COMPREHENSIVE METABOLIC PANEL
AG Ratio: 1.5 (calc) (ref 1.0–2.5)
ALT: 16 U/L (ref 6–29)
AST: 18 U/L (ref 10–35)
Albumin: 4.1 g/dL (ref 3.6–5.1)
Alkaline phosphatase (APISO): 68 U/L (ref 31–125)
BUN: 12 mg/dL (ref 7–25)
CO2: 26 mmol/L (ref 20–32)
Calcium: 8.7 mg/dL (ref 8.6–10.2)
Chloride: 101 mmol/L (ref 98–110)
Creat: 1.05 mg/dL (ref 0.50–1.10)
Globulin: 2.8 g/dL (calc) (ref 1.9–3.7)
Glucose, Bld: 72 mg/dL (ref 65–99)
Potassium: 4.1 mmol/L (ref 3.5–5.3)
Sodium: 136 mmol/L (ref 135–146)
Total Bilirubin: 0.4 mg/dL (ref 0.2–1.2)
Total Protein: 6.9 g/dL (ref 6.1–8.1)

## 2019-07-27 ENCOUNTER — Ambulatory Visit (INDEPENDENT_AMBULATORY_CARE_PROVIDER_SITE_OTHER): Admitting: Psychiatry

## 2019-07-27 ENCOUNTER — Other Ambulatory Visit: Payer: Self-pay

## 2019-07-27 ENCOUNTER — Encounter: Payer: Self-pay | Admitting: *Deleted

## 2019-07-27 ENCOUNTER — Encounter: Payer: Self-pay | Admitting: Psychiatry

## 2019-07-27 DIAGNOSIS — F3181 Bipolar II disorder: Secondary | ICD-10-CM | POA: Diagnosis not present

## 2019-07-27 MED ORDER — FLUOXETINE HCL 40 MG PO CAPS: 40.0000 mg | ORAL_CAPSULE | Freq: Every day | ORAL | 0 refills | Status: DC

## 2019-07-27 MED ORDER — DOXEPIN HCL 25 MG PO CAPS: ORAL_CAPSULE | ORAL | 1 refills | Status: DC

## 2019-07-27 MED ORDER — ARIPIPRAZOLE 5 MG PO TABS: 5.0000 mg | ORAL_TABLET | Freq: Every day | ORAL | 0 refills | Status: DC

## 2019-07-27 NOTE — Progress Notes (Signed)
BH MD/PA/NP OP Progress Note   I connected with  [employer] on 07/27/19 by a video enabled telemedicine application and verified that I am speaking with the correct person using two identifiers.   I discussed the limitations of evaluation and management by telemedicine. The patient expressed understanding and agreed to proceed.   07/27/2019 2:39 PM Sharon Merritt  MRN:  [id number]  Chief Complaint: " I am feeling much better."   HPI: Patient reported that she is doing much better now.  She informed that after she increase the dose of Abilify to 5 mg she started to come down from her hypomanic phase over 2 to 3 days.  She also has been sleeping much better than before.  She has been using doxepin only as needed.  She cannot think clearly and accomplish her tasks now.  She is not impulsively buying things like she was.  She is able to think through the issues and make decisions. She feels this combination of Prozac 40 mg and Abilify 5 mg is good for her for now.    Visit Diagnosis:    ICD-10-CM   1. Bipolar 2 disorder (HCC)  F31.81     Past Psychiatric History: MDD  Past Medical History:  Past Medical History:  Diagnosis Date  . Breast discharge   . [name] infection   . Essential hypertension   . Fibromyalgia   . Lyme disease   . Rocky Mountain spotted fever     Past Surgical History:  Procedure Laterality Date  . TUBAL LIGATION      Family Psychiatric History: see below  Family History:  Family History  Problem Relation Age of Onset  . COPD Mother   . Depression Mother   . Diabetes Father   . Hypertension Father   . Stroke Father   . Bipolar disorder Sister   . Post-traumatic stress disorder Sister   . Alcohol abuse Sister   . Drug abuse Sister   . Pulmonary embolism Sister   . Bipolar disorder Cousin   . Schizophrenia Cousin     Social History:  Social History   Socioeconomic History  . Marital status: Married    Spouse name: [name]   .  Number of children: 3  . Years of education: Assoc   . Highest education level: Not on file  Occupational History    Employer: [employer]  Tobacco Use  . Smoking status: Former Smoker    Types: Cigarettes    Quit date: 02/14/2009    Years since quitting: 10.4  . Smokeless tobacco: Never Used  Substance and Sexual Activity  . Alcohol use: Not Currently    Alcohol/week: 0.0 standard drinks    Comment: Rarely   . Drug use: No  . Sexual activity: Not Currently    Birth control/protection: Surgical  Other Topics Concern  . Not on file  Social History Narrative   Patient lives at home with her husband [name]) and her children.    Patient works full time.   Caffeine- Tea 3-4 times daily.   Patient is right-handed.   Patient has a Geophysicist/field seismologist.         Social Determinants of Health   Financial Resource Strain:   . Difficulty of Paying Living Expenses: Not on file  Food Insecurity:   . Worried About Charity fundraiser in the Last Year: Not on file  . Ran Out of Food in the Last Year: Not on file  Transportation Needs:   .  Lack of Transportation (Medical): Not on file  . Lack of Transportation (Non-Medical): Not on file  Physical Activity:   . Days of Exercise per Week: Not on file  . Minutes of Exercise per Session: Not on file  Stress:   . Feeling of Stress : Not on file  Social Connections:   . Frequency of Communication with Friends and Family: Not on file  . Frequency of Social Gatherings with Friends and Family: Not on file  . Attends Religious Services: Not on file  . Active Member of Clubs or Organizations: Not on file  . Attends Archivist Meetings: Not on file  . Marital Status: Not on file    Allergies: No Known Allergies  Metabolic Disorder Labs: No results found for: HGBA1C, MPG No results found for: PROLACTIN Lab Results  Component Value Date   CHOL 216 (H) 07/20/2019   TRIG 123 07/20/2019   HDL 65 07/20/2019   CHOLHDL 3.3  07/20/2019   VLDL 27 02/15/2013   LDLCALC 128 (H) 07/20/2019   LDLCALC 100 (H) 02/15/2013   Lab Results  Component Value Date   TSH 2.163 07/24/2015   TSH 3.039 10/09/2014    Therapeutic Level Labs: No results found for: LITHIUM No results found for: VALPROATE No components found for:  CBMZ  Current Medications: Current Outpatient Medications  Medication Sig Dispense Refill  . albuterol (VENTOLIN HFA) 108 (90 Base) MCG/ACT inhaler Inhale 2 puffs into the lungs every 6 (six) hours as needed for wheezing or shortness of breath. 18 g 11  . ARIPiprazole (ABILIFY) 5 MG tablet Take 1 tablet (5 mg total) by mouth daily. 30 tablet 1  . doxepin (SINEQUAN) 25 MG capsule Take 1-2 capsules at bedtime for sleep 60 capsule 1  . FLUoxetine (PROZAC) 40 MG capsule Take 1 capsule (40 mg total) by mouth daily. 90 capsule 0  . fluticasone-salmeterol (ADVAIR HFA) 115-21 MCG/ACT inhaler Inhale 2 puffs into the lungs 2 (two) times daily. 3 Inhaler 3  . LORazepam (ATIVAN) 0.5 MG tablet TAKE 1 TABLET BY MOUTH 2 TIMES DAILY AS NEEDED FOR ANXIETY. 45 tablet 1  . mometasone (NASONEX) 50 MCG/ACT nasal spray Place 2 sprays into the nose daily. 17 g 0  . nitroGLYCERIN (NITROSTAT) 0.4 MG SL tablet Place 1 tablet (0.4 mg total) under the tongue every 5 (five) minutes as needed for chest pain. 10 tablet 1  . Spacer/Aero-Holding Chambers (AEROCHAMBER PLUS) inhaler Use as instructed 1 each 2  . valACYclovir (VALTREX) 1000 MG tablet Take 1,000 mg by mouth daily as needed.      No current facility-administered medications for this visit.     Musculoskeletal: Strength & Muscle Tone: unable to assess due to telemed visit Gait & Station: unable to assess due to telemed visit Patient leans: unable to assess due to telemed visit   Psychiatric Specialty Exam: Review of Systems  There were no vitals taken for this visit.There is no height or weight on file to calculate BMI.  General Appearance: Fairly Groomed  Eye  Contact:  Good  Speech:  Clear and Coherent and Normal Rate  Volume:  Normal  Mood:  Euthymic  Affect:  Congruent  Thought Process:  Goal Directed, Linear and Descriptions of Associations: Intact  Orientation:  Full (Time, Place, and Person)  Thought Content: Logical   Suicidal Thoughts:  No  Homicidal Thoughts:  No  Memory:  Recent;   Good Remote;   Good  Judgement:  Fair  Insight:  Fair  Psychomotor Activity:  Normal  Concentration:  Concentration: Good and Attention Span: Fair  Recall:  Good  Fund of Knowledge: Good  Language: Good  Akathisia:  Negative  Handed:  Right  AIMS (if indicated): not done due to telemed visit  Assets:  Communication Skills Desire for Improvement Financial Resources/Insurance Housing  ADL's:  Intact  Cognition: WNL  Sleep:  Good   Screenings: GAD-7     Office Visit from 07/13/2019 in [facility] Office Visit from 11/24/2018 in [facility]  Total GAD-7 Score  3  18    PHQ2-9     Office Visit from 07/13/2019 in [facility] Office Visit from 11/24/2018 in [facility] Office Visit from 12/18/2016 in [facility] Office Visit from 08/21/2016 in [facility] Office Visit from 10/09/2014 in [facility]  PHQ-2 Total Score  0  6  6  0  0  PHQ-9 Total Score  0  18  21  0  --       Assessment and Plan: Patient appears to be doing much better and is no longer hypomanic on 2-week follow-up since her last visit.  She has been compliant with her medications and feels they are helping her.  1. Bipolar 2 disorder (HCC)  - ARIPiprazole (ABILIFY) 5 MG tablet; Take 1 tablet (5 mg total) by mouth daily.  Dispense: 90 tablet; Refill: 0 - FLUoxetine (PROZAC) 40 MG capsule; Take 1 capsule (40 mg total) by mouth daily.  Dispense: 90 capsule; Refill: 0 - doxepin (SINEQUAN) 25 MG capsule; Take 1-2 capsules at bedtime for sleep  Dispense: 60 capsule; Refill:  1  F/up in 6 weeks with Dr. Modesta Messing. Pt was advised to contact the clinic if need arises.  [place] Crane, MD 07/27/2019, 2:39 PM

## 2019-08-19 ENCOUNTER — Ambulatory Visit: Admitting: Psychiatry
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
## 2019-08-25 ENCOUNTER — Ambulatory Visit (HOSPITAL_COMMUNITY): Admitting: Psychiatry

## 2019-08-31 ENCOUNTER — Encounter (HOSPITAL_COMMUNITY): Payer: Self-pay | Admitting: Psychiatry

## 2019-08-31 ENCOUNTER — Ambulatory Visit (INDEPENDENT_AMBULATORY_CARE_PROVIDER_SITE_OTHER): Admitting: Psychiatry

## 2019-08-31 ENCOUNTER — Other Ambulatory Visit: Payer: Self-pay

## 2019-08-31 DIAGNOSIS — F3181 Bipolar II disorder: Secondary | ICD-10-CM | POA: Diagnosis not present
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
## 2019-08-31 NOTE — Patient Instructions (Signed)
1.Continuefluoxetine40 mg daily 2. Continue Abilify 5 mg daily  3. Continue ativan 0.5 mg daily as needed for anxiety 4. Next appointment: 4/7 at 11:30

## 2019-09-07 ENCOUNTER — Other Ambulatory Visit: Payer: Self-pay

## 2019-09-07 ENCOUNTER — Ambulatory Visit (INDEPENDENT_AMBULATORY_CARE_PROVIDER_SITE_OTHER): Admitting: Family Medicine

## 2019-09-07 ENCOUNTER — Encounter: Payer: Self-pay | Admitting: Family Medicine

## 2019-09-07 VITALS — BP 124/64 | HR 82 | Temp 98.7°F | Resp 14 | Ht 67.0 in | Wt 247.0 lb

## 2019-09-07 DIAGNOSIS — M25512 Pain in left shoulder: Secondary | ICD-10-CM | POA: Diagnosis not present

## 2019-09-07 DIAGNOSIS — G8929 Other chronic pain: Secondary | ICD-10-CM | POA: Diagnosis not present

## 2019-09-07 MED ORDER — NAPROXEN 500 MG PO TABS: 500.0000 mg | ORAL_TABLET | Freq: Two times a day (BID) | ORAL | 1 refills | Status: DC

## 2019-09-07 NOTE — Patient Instructions (Signed)
F/U as needed Referral to orthopedics

## 2019-09-07 NOTE — Progress Notes (Signed)
   Subjective:    Patient ID: Sharon Merritt, female    DOB: 29-Mar-1976, 44 y.o.   MRN: [id number]  Patient presents for L Shoulder Pain (x2 months- no known injury- possible repeativie issue- decreased ROM)  Left shoulder pain worsened over the past 2 months.  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  She has pain near the [facility] joint but also the top of the shoulder.  When she tries to elevate above shoulder height or reach behind she has discomfort and feels like it is catching.  Her arm feels fatigued at times as well.  She has been taking ibuprofen which helps a little.  She does have chronic neck pain has been following with a chiropractor Dr. Grandville Silos for their quite some time now.  She was told that she has mild degenerative disc disease on her x-rays.  She denies any tingling or numbness in the fingertips.    Review Of Systems:  GEN- denies fatigue, fever, weight loss,weakness, recent illness HEENT- denies eye drainage, change in vision, nasal discharge, CVS- denies chest pain, palpitations RESP- denies SOB, cough, wheeze ABD- denies N/V, change in stools, abd pain GU- denies dysuria, hematuria, dribbling, incontinence MSK- + joint pain, muscle aches, injury Neuro- denies headache, dizziness, syncope, seizure activity       Objective:    BP 124/64   Pulse 82   Temp 98.7 F (37.1 C) (Temporal)   Resp 14   Ht 5' 7"  (1.702 m)   Wt 247 lb (112 kg)   SpO2 98%   BMI 38.69 kg/m  GEN- NAD, alert and oriented x3 HEENT- PERRL, EOMI, non injected sclera, pink conjunctiva, MMM, oropharynx clear Neck- Supple, good range of motion mild tenderness to palpation C-spine neck is Spurling's CVS- RRR, no murmur RESP-CTAB Musculoskeletal -rotator cuff grossly intact biceps intact pain with reach bedtime tenderness to palpation near [facility] joint no deformity grossly noted.  Strength  intact in upper extremity, negative empty can        Assessment & Plan:      Problem List Items Addressed This Visit    None    Visit Diagnoses    Chronic left shoulder pain    -  Primary   Referral to orthopedics.  May have some type of DJD in the shoulder or something going on near the [facility] joint.  We will give her Naprosyn twice daily for inflammation   Relevant Medications   naproxen (NAPROSYN) 500 MG tablet   Other Relevant Orders   Ambulatory referral to Orthopedic Surgery      Note: This dictation was prepared with Dragon dictation along with smaller phrase technology. Any transcriptional errors that result from this process are unintentional.

## 2019-09-15 ENCOUNTER — Ambulatory Visit

## 2019-09-15 ENCOUNTER — Encounter: Payer: Self-pay | Admitting: Orthopaedic Surgery

## 2019-09-15 ENCOUNTER — Other Ambulatory Visit: Payer: Self-pay

## 2019-09-15 ENCOUNTER — Ambulatory Visit (INDEPENDENT_AMBULATORY_CARE_PROVIDER_SITE_OTHER): Admitting: Orthopaedic Surgery

## 2019-09-15 VITALS — BP 125/78 | HR 67 | Ht 67.0 in | Wt 247.0 lb

## 2019-09-15 DIAGNOSIS — G8929 Other chronic pain: Secondary | ICD-10-CM

## 2019-09-15 DIAGNOSIS — M25512 Pain in left shoulder: Secondary | ICD-10-CM

## 2019-09-15 NOTE — Progress Notes (Signed)
Subjective:    Patient ID: Sharon Merritt, female    DOB: 29-Dec-1975, 44 y.o.   MRN: [id number]  HPI She has had pain in the left shoulder for two to three months.  She has no trauma, no swelling.  It hurts to lift overhead and when sleeping on it.  She works for [employer] but has not had any untoward events.  She has no numbness. She saw Dr. Buelah Manis last week and was given Naprosyn.  That has helped.  I have reviewed Dr. Dorian Heckle notes.   Review of Systems  Constitutional: Positive for activity change.  Musculoskeletal: Positive for arthralgias.  All other systems reviewed and are negative.  For Review of Systems, all other systems reviewed and are negative.  The following is a summary of the past history medically, past history surgically, known current medicines, social history and family history.  This information is gathered electronically by the computer from prior information and documentation.  I review this each visit and have found including this information at this point in the chart is beneficial and informative.   Past Medical History:  Diagnosis Date  . Breast discharge   . [name] infection   . Essential hypertension   . Fibromyalgia   . Lyme disease   . Rocky Mountain spotted fever     Past Surgical History:  Procedure Laterality Date  . TUBAL LIGATION      Current Outpatient Medications on File Prior to Visit  Medication Sig Dispense Refill  . albuterol (VENTOLIN HFA) 108 (90 Base) MCG/ACT inhaler Inhale 2 puffs into the lungs every 6 (six) hours as needed for wheezing or shortness of breath. 18 g 11  . ARIPiprazole (ABILIFY) 5 MG tablet Take 1 tablet (5 mg total) by mouth daily. 90 tablet 0  . doxepin (SINEQUAN) 25 MG capsule Take 1-2 capsules at bedtime for sleep 60 capsule 1  . FLUoxetine (PROZAC) 40 MG capsule Take 1 capsule (40 mg total) by mouth daily. 90 capsule 0  . fluticasone-salmeterol (ADVAIR HFA) 115-21 MCG/ACT inhaler Inhale 2 puffs into  the lungs 2 (two) times daily. 3 Inhaler 3  . LORazepam (ATIVAN) 0.5 MG tablet TAKE 1 TABLET BY MOUTH 2 TIMES DAILY AS NEEDED FOR ANXIETY. 45 tablet 1  . mometasone (NASONEX) 50 MCG/ACT nasal spray Place 2 sprays into the nose daily. 17 g 0  . naproxen (NAPROSYN) 500 MG tablet Take 1 tablet (500 mg total) by mouth 2 (two) times daily with a meal. 60 tablet 1  . nitroGLYCERIN (NITROSTAT) 0.4 MG SL tablet Place 1 tablet (0.4 mg total) under the tongue every 5 (five) minutes as needed for chest pain. 10 tablet 1  . Spacer/Aero-Holding Chambers (AEROCHAMBER PLUS) inhaler Use as instructed 1 each 2  . valACYclovir (VALTREX) 1000 MG tablet Take 1,000 mg by mouth daily as needed.      No current facility-administered medications on file prior to visit.    Social History   Socioeconomic History  . Marital status: Married    Spouse name: [name]   . Number of children: 3  . Years of education: Assoc   . Highest education level: Not on file  Occupational History    Employer: [employer]  Tobacco Use  . Smoking status: Former Smoker    Types: Cigarettes    Quit date: 02/14/2009    Years since quitting: 10.5  . Smokeless tobacco: Never Used  Substance and Sexual Activity  . Alcohol use: Not Currently  Alcohol/week: 0.0 standard drinks    Comment: Rarely   . Drug use: No  . Sexual activity: Not Currently    Birth control/protection: Surgical  Other Topics Concern  . Not on file  Social History Narrative   Patient lives at home with her husband [name]) and her children.    Patient works full time.   Caffeine- Tea 3-4 times daily.   Patient is right-handed.   Patient has a Geophysicist/field seismologist.         Social Determinants of Health   Financial Resource Strain:   . Difficulty of Paying Living Expenses:   Food Insecurity:   . Worried About Charity fundraiser in the Last Year:   . Arboriculturist in the Last Year:   Transportation Needs:   . Film/video editor (Medical):     Marland Kitchen Lack of Transportation (Non-Medical):   Physical Activity:   . Days of Exercise per Week:   . Minutes of Exercise per Session:   Stress:   . Feeling of Stress :   Social Connections:   . Frequency of Communication with Friends and Family:   . Frequency of Social Gatherings with Friends and Family:   . Attends Religious Services:   . Active Member of Clubs or Organizations:   . Attends Archivist Meetings:   Marland Kitchen Marital Status:   Intimate Partner Violence:   . Fear of Current or Ex-Partner:   . Emotionally Abused:   Marland Kitchen Physically Abused:   . Sexually Abused:     Family History  Problem Relation Age of Onset  . COPD Mother   . Depression Mother   . Diabetes Father   . Hypertension Father   . Stroke Father   . Bipolar disorder Sister   . Post-traumatic stress disorder Sister   . Alcohol abuse Sister   . Drug abuse Sister   . Pulmonary embolism Sister   . Bipolar disorder Cousin   . Schizophrenia Cousin     BP 125/78   Pulse 67   Ht 5' 7"  (1.702 m)   Wt 247 lb (112 kg)   BMI 38.69 kg/m   Body mass index is 38.69 kg/m.     Objective:   Physical Exam Vitals and nursing note reviewed.  Constitutional:      Appearance: She is well-developed.  HENT:     Head: Normocephalic and atraumatic.  Eyes:     Conjunctiva/sclera: Conjunctivae normal.     Pupils: Pupils are equal, round, and reactive to light.  Cardiovascular:     Rate and Rhythm: Normal rate and regular rhythm.  Pulmonary:     Effort: Pulmonary effort is normal.  Abdominal:     Palpations: Abdomen is soft.  Musculoskeletal:       Arms:     Cervical back: Normal range of motion and neck supple.  Skin:    General: Skin is warm and dry.  Neurological:     Mental Status: She is alert and oriented to person, place, and time.     Cranial Nerves: No cranial nerve deficit.     Motor: No abnormal muscle tone.     Coordination: Coordination normal.     Deep Tendon Reflexes: Reflexes are normal  and symmetric. Reflexes normal.  Psychiatric:        Behavior: Behavior normal.        Thought Content: Thought content normal.        Judgment: Judgment normal.    X-rays  were done of the left shoulder, reported separately.       Assessment & Plan:   Encounter Diagnosis  Name Primary?  . Chronic left shoulder pain Yes   PROCEDURE NOTE:  The patient request injection, verbal consent was obtained.  The left shoulder was prepped appropriately after time out was performed.   Sterile technique was observed and injection of 1 cc of Depo-Medrol 40 mg with several cc's of plain xylocaine. Anesthesia was provided by ethyl chloride and a 20-gauge needle was used to inject the shoulder area. A posterior approach was used.  The injection was tolerated well.  A band aid dressing was applied.  The patient was advised to apply ice later today and tomorrow to the injection sight as needed.  Continue the Naprosyn.  Return in three weeks.  Call if any problem.  Precautions discussed.   Electronically Signed Sanjuana Kava, MD 3/18/20218:40 AM

## 2019-09-28 ENCOUNTER — Other Ambulatory Visit: Payer: Self-pay

## 2019-09-28 ENCOUNTER — Encounter (HOSPITAL_COMMUNITY): Payer: Self-pay | Admitting: Psychiatry

## 2019-09-28 ENCOUNTER — Ambulatory Visit (INDEPENDENT_AMBULATORY_CARE_PROVIDER_SITE_OTHER): Admitting: Psychiatry

## 2019-09-28 DIAGNOSIS — F3181 Bipolar II disorder: Secondary | ICD-10-CM | POA: Diagnosis not present

## 2019-09-28 DIAGNOSIS — F330 Major depressive disorder, recurrent, mild: Secondary | ICD-10-CM

## 2019-09-28 NOTE — Progress Notes (Signed)
Virtual Visit via Video Note  I connected with Sharon Merritt on 09/28/19 at 4:05 PM EDT by a video enabled telemedicine application and verified that I am speaking with the correct person using two identifiers.   I discussed the limitations of evaluation and management by telemedicine and the availability of in person appointments. The patient expressed understanding and agreed to proceed.  I provided 50  minutes of non-face-to-face time during this encounter.   [name], LCSW     THERAPIST PROGRESS NOTE  Session Time: Wednesday 09/28/2019 4:05 PM - 4:55 PM   Participation Level: Active  Behavioral Response: Alert, less depressed, increased anxiety  Type of Therapy: Individual Therapy  Treatment Goals addressed:  learn and implement cognitive and behavioral strategies to overcome depression and cope with anxiety  Interventions: CBT and Supportive  Summary: Sharon Merritt is a 45 y.o. female who is referred for services by psychiatrist Dr. Modesta Messing due to patient experiencing symptoms of anxiety and depression. She denies any psychiatric hospitalizations and any previous involvement in outpatient therapy.  Patient reports experiencing anxiety and depression intermittently since high school.  Current depressive symptoms started last year and include social withdrawal, excessive sleeping, deep sadness, and crying spells.  She reports constant worry about the safety of her family and pets and constant thoughts of "what if".  Patient last was seen via virtual visit 2 months ago.  She continues to experience mild depression and moderate anxiety including excessive worry, nervousness, and muscle tension.  She reports feeling better overall  stable since talking with psychiatrist and having a medication change.  Per her report, previous medication triggered a manic episode.  She reports feeling much more stable.  However, she now is experiencing significant stress and anxiety triggered by  her husband's residual effects of Covid 19.  Per her report, her husband is at home but is having smedical complications.  She fears he is depressed and he will give up.  Patient has been trying to cope by going to the gym 3 times a week.  She also reports support from her daughter and a friend.  Patient reports sometimes finding herself holding her breath due to stress and anxiety.  Suicidal/Homicidal: Nowithout intent/plan  Therapist Response:, reviewed symptoms, praised and reinforced patient's increased behavioral activation and social involvement/exercising,discussed effects, discussed stressors, facilitated expression of thoughts and feelings, validated feelings, discussed possibility of talking with husband about seeing his doctor or therapist, provided psychoeducation regarding anxiety and the stress response, discussed rationale for and assisted patient practice progressive muscle relaxation to trigger relaxation response, assigned patient to continue practicing deep breathing as well as progressive muscle relaxation to help trigger relaxation response, will send patient handouts on relaxation techniques  Plan: Return again in 2 weeks.  Diagnosis: Axis I: MDD, mild    Axis II: No diagnosis    [name], LCSW 09/28/2019
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
## 2019-10-05 ENCOUNTER — Ambulatory Visit (HOSPITAL_COMMUNITY): Admitting: Psychiatry

## 2019-10-11 ENCOUNTER — Ambulatory Visit (INDEPENDENT_AMBULATORY_CARE_PROVIDER_SITE_OTHER): Admitting: Orthopaedic Surgery

## 2019-10-11 ENCOUNTER — Other Ambulatory Visit: Payer: Self-pay

## 2019-10-11 ENCOUNTER — Encounter: Payer: Self-pay | Admitting: Orthopaedic Surgery

## 2019-10-11 VITALS — BP 134/83 | HR 65 | Ht 67.0 in | Wt 240.0 lb

## 2019-10-11 DIAGNOSIS — M25512 Pain in left shoulder: Secondary | ICD-10-CM | POA: Diagnosis not present

## 2019-10-11 DIAGNOSIS — G8929 Other chronic pain: Secondary | ICD-10-CM | POA: Diagnosis not present

## 2019-10-11 NOTE — Progress Notes (Signed)
Patient Sharon Merritt, female DOB:1975/08/25, 44 y.o. WNU:[id number]  Chief Complaint  Patient presents with  . Shoulder Pain    left    HPI  Sharon Merritt is a 44 y.o. female who has pain of the left shoulder.  She still has pain with pushing things, less pain with other activities.  The injection helped.  She is taking the naprosyn.  She has no new trauma.    I would like to begin PT if she can work it out with the employer, the [employer].  I have given exercise sheets.   Body mass index is 37.59 kg/m.  ROS  Review of Systems  Constitutional: Positive for activity change.  Musculoskeletal: Positive for arthralgias.  All other systems reviewed and are negative.   All other systems reviewed and are negative.  The following is a summary of the past history medically, past history surgically, known current medicines, social history and family history.  This information is gathered electronically by the computer from prior information and documentation.  I review this each visit and have found including this information at this point in the chart is beneficial and informative.    Past Medical History:  Diagnosis Date  . Breast discharge   . [name] infection   . Essential hypertension   . Fibromyalgia   . Lyme disease   . Rocky Mountain spotted fever     Past Surgical History:  Procedure Laterality Date  . TUBAL LIGATION      Family History  Problem Relation Age of Onset  . COPD Mother   . Depression Mother   . Diabetes Father   . Hypertension Father   . Stroke Father   . Bipolar disorder Sister   . Post-traumatic stress disorder Sister   . Alcohol abuse Sister   . Drug abuse Sister   . Pulmonary embolism Sister   . Bipolar disorder Cousin   . Schizophrenia Cousin     Social History Social History   Tobacco Use  . Smoking status: Former Smoker    Types: Cigarettes    Quit date: 02/14/2009    Years since quitting: 10.6  . Smokeless tobacco:  Never Used  Substance Use Topics  . Alcohol use: Not Currently    Alcohol/week: 0.0 standard drinks    Comment: Rarely   . Drug use: No    No Known Allergies  Current Outpatient Medications  Medication Sig Dispense Refill  . albuterol (VENTOLIN HFA) 108 (90 Base) MCG/ACT inhaler Inhale 2 puffs into the lungs every 6 (six) hours as needed for wheezing or shortness of breath. 18 g 11  . ARIPiprazole (ABILIFY) 5 MG tablet Take 1 tablet (5 mg total) by mouth daily. 90 tablet 0  . doxepin (SINEQUAN) 25 MG capsule Take 1-2 capsules at bedtime for sleep 60 capsule 1  . FLUoxetine (PROZAC) 40 MG capsule Take 1 capsule (40 mg total) by mouth daily. 90 capsule 0  . fluticasone-salmeterol (ADVAIR HFA) 115-21 MCG/ACT inhaler Inhale 2 puffs into the lungs 2 (two) times daily. 3 Inhaler 3  . LORazepam (ATIVAN) 0.5 MG tablet TAKE 1 TABLET BY MOUTH 2 TIMES DAILY AS NEEDED FOR ANXIETY. 45 tablet 1  . mometasone (NASONEX) 50 MCG/ACT nasal spray Place 2 sprays into the nose daily. 17 g 0  . naproxen (NAPROSYN) 500 MG tablet Take 1 tablet (500 mg total) by mouth 2 (two) times daily with a meal. 60 tablet 1  . nitroGLYCERIN (NITROSTAT) 0.4 MG SL tablet Place 1  tablet (0.4 mg total) under the tongue every 5 (five) minutes as needed for chest pain. 10 tablet 1  . Spacer/Aero-Holding Chambers (AEROCHAMBER PLUS) inhaler Use as instructed 1 each 2  . valACYclovir (VALTREX) 1000 MG tablet Take 1,000 mg by mouth daily as needed.      No current facility-administered medications for this visit.     Physical Exam  Blood pressure 134/83, pulse 65, height 5' 7"  (1.702 m), weight 240 lb (108.9 kg), last menstrual period 09/11/2019.  Constitutional: overall normal hygiene, normal nutrition, well developed, normal grooming, normal body habitus. Assistive device:none  Musculoskeletal: gait and station Limp none, muscle tone and strength are normal, no tremors or atrophy is present.  .  Neurological: coordination  overall normal.  Deep tendon reflex/nerve stretch intact.  Sensation normal.  Cranial nerves II-XII intact.   Skin:   Normal overall no scars, lesions, ulcers or rashes. No psoriasis.  Psychiatric: Alert and oriented x 3.  Recent memory intact, remote memory unclear.  Normal mood and affect. Well groomed.  Good eye contact.  Cardiovascular: overall no swelling, no varicosities, no edema bilaterally, normal temperatures of the legs and arms, no clubbing, cyanosis and good capillary refill.  Lymphatic: palpation is normal.  Left shoulder has full motion today but pain in the extremes.  NV intact.  Grips normal.  All other systems reviewed and are negative   The patient has been educated about the nature of the problem(s) and counseled on treatment options.  The patient appeared to understand what I have discussed and is in agreement with it.  Encounter Diagnosis  Name Primary?  . Chronic left shoulder pain Yes    PLAN Call if any problems.  Precautions discussed.  Continue current medications.   Return to clinic 1 month   Begin PT.  Electronically Signed Sanjuana Kava, MD 4/13/20218:16 AM

## 2019-10-14 ENCOUNTER — Ambulatory Visit (HOSPITAL_COMMUNITY): Admitting: Psychiatry

## 2019-11-08 ENCOUNTER — Ambulatory Visit: Admitting: Orthopaedic Surgery

## 2019-11-16 ENCOUNTER — Ambulatory Visit (INDEPENDENT_AMBULATORY_CARE_PROVIDER_SITE_OTHER): Admitting: Psychiatry

## 2019-11-16 ENCOUNTER — Other Ambulatory Visit: Payer: Self-pay

## 2019-11-16 ENCOUNTER — Encounter (HOSPITAL_COMMUNITY): Payer: Self-pay | Admitting: Psychiatry

## 2019-11-16 DIAGNOSIS — F3181 Bipolar II disorder: Secondary | ICD-10-CM

## 2019-11-16 NOTE — Progress Notes (Signed)
Virtual Visit via Video Note  I connected with Sharon Merritt on 11/16/19 at 4:10 PM EDT by a video enabled telemedicine application and verified that I am speaking with the correct person using two identifiers.   I discussed the limitations of evaluation and management by telemedicine and the availability of in person appointments. The patient expressed understanding and agreed to proceed.   I provided 40  minutes of non-face-to-face time during this encounter.   [name], LCSW    THERAPIST PROGRESS NOTE  Session Time: Wednesday 11/16/2019 4:10 PM - 4:50 PM   Participation Level: Active  Behavioral Response: Alert, anxious   Type of Therapy: Individual Therapy  Treatment Goals addressed:  learn and implement cognitive and behavioral strategies to overcome depression and cope with anxiety  Interventions: CBT and Supportive  Summary: Sharon Merritt is a 44 y.o. female who is referred for services by psychiatrist Dr. Modesta Messing due to patient experiencing symptoms of anxiety and depression. She denies any psychiatric hospitalizations and any previous involvement in outpatient therapy.  Patient reports experiencing anxiety and depression intermittently since high school.  Current depressive symptoms started last year and include social withdrawal, excessive sleeping, deep sadness, and crying spells.  She reports constant worry about the safety of her family and pets and constant thoughts of "what if".  Patient last was seen via virtual visit about 7 weeks ago.   She currently is experiencing stable mood but reports having a depressive episode about 3 to 4 weeks ago.  She states she was very down, was very tearful, and experienced negative thoughts along with wishing she was gone.  Per report, symptoms lasted for about 2 weeks.  She coped by using self talk and talking with her husband.  She expresses concern that she has developed a hand tremor as well as experience her head shaking at  times.  Patient agrees to contact psychiatrist Dr. Modesta Messing regarding concerns and to reschedule her appointment.  Patient has continued to attend the gym regularly as well as work.  She also recently enjoyed celebrating her birthday with her daughter and is looking forward to celebrating daughter's birthday later this evening.  Patient reports continuing to experience anxiety and worry.  She continues to have pattern of catastrophizing thoughts.  She is relieved her husband's condition has improved and he is feeling much better.     Suicidal/Homicidal: Nowithout intent/plan  Therapist Response:, reviewed symptoms, discussed stressors, facilitated expression of thoughts and feelings, validated feelings, try to assist patient identify triggers of depressive episode, began to provide psychoeducation regarding bipolar disorder, will send patient handouts on bipolar disorder in preparation for next session, assisted patient examine her thought patterns evoking anxiety, reviewed connection between thoughts/mood/behavior, assisted patient examine the probability of feared situation and helped her assess her capability to cope with worst-case scenario, discussed next steps for treatment  Plan: Return again in 2 weeks.  Diagnosis: Axis I: Bipolar Disorder    Axis II: No diagnosis    [name], LCSW 11/16/2019

## 2019-11-17 ENCOUNTER — Encounter: Payer: Self-pay | Admitting: Orthopaedic Surgery

## 2019-11-17 ENCOUNTER — Other Ambulatory Visit: Payer: Self-pay

## 2019-11-17 ENCOUNTER — Ambulatory Visit (INDEPENDENT_AMBULATORY_CARE_PROVIDER_SITE_OTHER): Admitting: Orthopaedic Surgery

## 2019-11-17 VITALS — BP 128/86 | HR 89 | Ht 67.0 in | Wt 240.0 lb

## 2019-11-17 DIAGNOSIS — G8929 Other chronic pain: Secondary | ICD-10-CM

## 2019-11-17 DIAGNOSIS — M25512 Pain in left shoulder: Secondary | ICD-10-CM

## 2019-11-17 NOTE — Progress Notes (Signed)
Patient HQ:Sharon Merritt, female DOB:06/28/1976, 44 y.o. WUX:[id number]  Chief Complaint  Patient presents with  . Shoulder Pain    Chronic left shoulder pain    HPI  Sharon Merritt is a 44 y.o. female who has left shoulder pain.  She works at the [employer].  She is somewhat better.  She has pain but not as much.  She has no weakness.  She has more good days than bad.  I have recommended BioFreeze roll-on to use at work. Continue her exercises.   Body mass index is 37.59 kg/m.  ROS  Review of Systems  Constitutional: Positive for activity change.  Musculoskeletal: Positive for arthralgias.  All other systems reviewed and are negative.   All other systems reviewed and are negative.  The following is a summary of the past history medically, past history surgically, known current medicines, social history and family history.  This information is gathered electronically by the computer from prior information and documentation.  I review this each visit and have found including this information at this point in the chart is beneficial and informative.    Past Medical History:  Diagnosis Date  . Breast discharge   . [name] infection   . Essential hypertension   . Fibromyalgia   . Lyme disease   . Rocky Mountain spotted fever     Past Surgical History:  Procedure Laterality Date  . TUBAL LIGATION      Family History  Problem Relation Age of Onset  . COPD Mother   . Depression Mother   . Diabetes Father   . Hypertension Father   . Stroke Father   . Bipolar disorder Sister   . Post-traumatic stress disorder Sister   . Alcohol abuse Sister   . Drug abuse Sister   . Pulmonary embolism Sister   . Bipolar disorder Cousin   . Schizophrenia Cousin     Social History Social History   Tobacco Use  . Smoking status: Former Smoker    Types: Cigarettes    Quit date: 02/14/2009    Years since quitting: 10.7  . Smokeless tobacco: Never Used  Substance Use Topics   . Alcohol use: Not Currently    Alcohol/week: 0.0 standard drinks    Comment: Rarely   . Drug use: No    No Known Allergies  Current Outpatient Medications  Medication Sig Dispense Refill  . albuterol (VENTOLIN HFA) 108 (90 Base) MCG/ACT inhaler Inhale 2 puffs into the lungs every 6 (six) hours as needed for wheezing or shortness of breath. 18 g 11  . ARIPiprazole (ABILIFY) 5 MG tablet Take 1 tablet (5 mg total) by mouth daily. 90 tablet 0  . doxepin (SINEQUAN) 25 MG capsule Take 1-2 capsules at bedtime for sleep 60 capsule 1  . FLUoxetine (PROZAC) 40 MG capsule Take 1 capsule (40 mg total) by mouth daily. 90 capsule 0  . fluticasone-salmeterol (ADVAIR HFA) 115-21 MCG/ACT inhaler Inhale 2 puffs into the lungs 2 (two) times daily. 3 Inhaler 3  . LORazepam (ATIVAN) 0.5 MG tablet TAKE 1 TABLET BY MOUTH 2 TIMES DAILY AS NEEDED FOR ANXIETY. 45 tablet 1  . mometasone (NASONEX) 50 MCG/ACT nasal spray Place 2 sprays into the nose daily. (Patient not taking: Reported on 11/16/2019) 17 g 0  . naproxen (NAPROSYN) 500 MG tablet Take 1 tablet (500 mg total) by mouth 2 (two) times daily with a meal. 60 tablet 1  . nitroGLYCERIN (NITROSTAT) 0.4 MG SL tablet Place 1 tablet (0.4 mg  total) under the tongue every 5 (five) minutes as needed for chest pain. 10 tablet 1  . Spacer/Aero-Holding Chambers (AEROCHAMBER PLUS) inhaler Use as instructed 1 each 2  . valACYclovir (VALTREX) 1000 MG tablet Take 1,000 mg by mouth daily as needed.      No current facility-administered medications for this visit.     Physical Exam  Blood pressure 128/86, pulse 89, height 5' 7"  (1.702 m), weight 240 lb (108.9 kg).  Constitutional: overall normal hygiene, normal nutrition, well developed, normal grooming, normal body habitus. Assistive device:none  Musculoskeletal: gait and station Limp none, muscle tone and strength are normal, no tremors or atrophy is present.  .  Neurological: coordination overall normal.  Deep  tendon reflex/nerve stretch intact.  Sensation normal.  Cranial nerves II-XII intact.   Skin:   Normal overall no scars, lesions, ulcers or rashes. No psoriasis.  Psychiatric: Alert and oriented x 3.  Recent memory intact, remote memory unclear.  Normal mood and affect. Well groomed.  Good eye contact.  Cardiovascular: overall no swelling, no varicosities, no edema bilaterally, normal temperatures of the legs and arms, no clubbing, cyanosis and good capillary refill.  Lymphatic: palpation is normal.  Left shoulder has full ROM but pain in the extremes.  NV intact.  Grips normal.  All other systems reviewed and are negative   The patient has been educated about the nature of the problem(s) and counseled on treatment options.  The patient appeared to understand what I have discussed and is in agreement with it.  Encounter Diagnosis  Name Primary?  . Chronic left shoulder pain Yes    PLAN Call if any problems.  Precautions discussed.  Continue current medications.   Return to clinic 1 month   Electronically Signed Sanjuana Kava, MD 5/20/20219:25 AM

## 2019-11-21 ENCOUNTER — Other Ambulatory Visit: Payer: Self-pay | Admitting: Family Medicine

## 2019-11-30 ENCOUNTER — Ambulatory Visit (INDEPENDENT_AMBULATORY_CARE_PROVIDER_SITE_OTHER): Admitting: Psychiatry

## 2019-11-30 ENCOUNTER — Other Ambulatory Visit: Payer: Self-pay

## 2019-11-30 DIAGNOSIS — F3181 Bipolar II disorder: Secondary | ICD-10-CM

## 2019-11-30 NOTE — Progress Notes (Signed)
Virtual Visit via Video Note  I connected with Sharon Merritt on 11/30/19 at 4:05 PM EDT by a video enabled telemedicine application and verified that I am speaking with the correct person using two identifiers.   I discussed the limitations of evaluation and management by telemedicine and the availability of in person appointments. The patient expressed understanding and agreed to proceed.  I provided 45 minutes of non-face-to-face time during this encounter.   [name], LCSW   THERAPIST PROGRESS NOTE  Session Time: Wednesday 11/30/2019 4:05 PM -  4:50 PM   Participation Level: Active  Behavioral Response: Alert, anxious   Type of Therapy: Individual Therapy  Treatment Goals addressed:  learn and implement cognitive and behavioral strategies to overcome depression and cope with anxiety  Interventions: CBT and Supportive  Summary: Sharon Merritt is a 44 y.o. female who is referred for services by psychiatrist Dr. Modesta Messing due to patient experiencing symptoms of anxiety and depression. She denies any psychiatric hospitalizations and any previous involvement in outpatient therapy.  Patient reports experiencing anxiety and depression intermittently since high school.  Current depressive symptoms started last year and include social withdrawal, excessive sleeping, deep sadness, and crying spells.  She reports constant worry about the safety of her family and pets and constant thoughts of "what if".  Patient last was seen via virtual visit about 3 weeks ago.   She states being okay since last session but expresses concern about being without Abilify since last week.  She is scheduled to see psychiatrist Dr. Modesta Messing next week.  She reports mood currently is stable but starting to experience sleep difficulty.  She also reports experiencing restlessness and being fidgety earlier this week.  She also reports continued poor concentration and difficulty sometimes expressing self as thoughts go  through her head quickly. She reports she has been trying to journal but difficulty doing this. Her hand tremors and head shaking have decreased per her report. She denies any specific stressors and reports her job as well as relationship with husband is going well.  She reports gaining to cope with anxiety better as she has become more aware of her pattern of catastrophizing thoughts and made efforts to replace with more rational thoughts.   Suicidal/Homicidal: Nowithout intent/plan  Therapist Response:, reviewed symptoms, discussed stressors, facilitated expression of thoughts and feelings, validated feelings, discussed following up with psychiatrist Dr. Modesta Messing or CMA regarding medication concerns and possible earlier appointment, provided psychoeducation regarding bipolar disorder and assisted patient identify symptoms she has experienced with depressive and hypomanic episodes, discussed the role of mood and symptom monitoring to manage bipolar disorder, provided patient with handout and developed plan with patient to monitor mood and symptoms daily/bring handout to next session  Plan: Return again in 2 weeks.  Diagnosis: Axis I: Bipolar Disorder    Axis II: No diagnosis    [name], LCSW 11/30/2019
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
## 2019-12-08 ENCOUNTER — Telehealth (INDEPENDENT_AMBULATORY_CARE_PROVIDER_SITE_OTHER): Admitting: Psychiatry

## 2019-12-08 ENCOUNTER — Other Ambulatory Visit: Payer: Self-pay

## 2019-12-08 ENCOUNTER — Encounter (HOSPITAL_COMMUNITY): Payer: Self-pay | Admitting: Psychiatry

## 2019-12-08 DIAGNOSIS — G4700 Insomnia, unspecified: Secondary | ICD-10-CM

## 2019-12-08 DIAGNOSIS — F3181 Bipolar II disorder: Secondary | ICD-10-CM

## 2019-12-08 MED ORDER — FLUOXETINE HCL 40 MG PO CAPS: 40.0000 mg | ORAL_CAPSULE | Freq: Every day | ORAL | 0 refills | Status: DC

## 2019-12-08 MED ORDER — LAMOTRIGINE 25 MG PO TABS: ORAL_TABLET | ORAL | 0 refills | Status: DC

## 2019-12-08 MED ORDER — ESZOPICLONE 1 MG PO TABS: 1.0000 mg | ORAL_TABLET | Freq: Every evening | ORAL | 0 refills | Status: DC | PRN

## 2019-12-08 NOTE — Patient Instructions (Signed)
1.Continuefluoxetine40 mg daily 2. Start lamotrigine 25 mg daily for two weeks, then 50 mg daily  (Discontinue Abilify) 3. Start Lunesta 1 mg at night as needed for insomnia 4. Discontinue doxepin  5. Next appointment: 7/8 at 8:40

## 2019-12-14 ENCOUNTER — Ambulatory Visit (HOSPITAL_COMMUNITY): Admitting: Psychiatry

## 2019-12-15 ENCOUNTER — Ambulatory Visit: Admitting: Orthopaedic Surgery
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
## 2020-01-04 ENCOUNTER — Other Ambulatory Visit: Payer: Self-pay

## 2020-01-04 ENCOUNTER — Ambulatory Visit (INDEPENDENT_AMBULATORY_CARE_PROVIDER_SITE_OTHER): Admitting: Psychiatry

## 2020-01-04 DIAGNOSIS — F3181 Bipolar II disorder: Secondary | ICD-10-CM | POA: Diagnosis not present

## 2020-01-04 NOTE — Progress Notes (Addendum)
Virtual Visit via Video Note  I connected with [name] on 01/04/20 at 4:10 PM EDT by a video enabled telemedicine application and verified that I am speaking with the correct person using two identifiers.   I discussed the limitations of evaluation and management by telemedicine and the availability of in person appointments. The patient expressed understanding and agreed to proceed.   I provided 45 minutes of non-face-to-face time during this encounter.   [name], LCSW  THERAPIST PROGRESS NOTE  Location:  Patient - Home/ Provider - [place] office  Session Time: Wednesday 01/04/2020 4:10 PM - 4:55 PM  Participation Level: Active  Behavioral Response: Alert, anxious   Type of Therapy: Individual Therapy  Treatment Goals addressed:  learn and implement cognitive and behavioral strategies to overcome depression and cope with anxiety  Interventions: CBT and Supportive  Summary: Sharon Merritt is a 44 y.o. female who is referred for services by psychiatrist Dr. Modesta Messing due to patient experiencing symptoms of anxiety and depression. She denies any psychiatric hospitalizations and any previous involvement in outpatient therapy.  Patient reports experiencing anxiety and depression intermittently since high school.  Current depressive symptoms started last year and include social withdrawal, excessive sleeping, deep sadness, and crying spells.  She reports constant worry about the safety of her family and pets and constant thoughts of "what if".  Patient last was seen via virtual visit about 3 weeks ago.   She reports increased mood lability and anxiety.  Per her report, she had an adverse reaction to beginning Lamictal on 12/08/2019.  She says she became really sick, tired, and sleepy.  She tried to take medication for 3 weeks.  During that time, she reports feeling so badly that she missed 2 days of work.  She discontinued taking the medication about a week ago.  She  will discuss medication concerns with psychiatrist Dr. Modesta Messing tomorrow during her med management appointment.  Patient reports increased negative thoughts about self and feelings of guilt as she has needed more help from her husband regarding taking care of daily task.  She reports she has not been using mood and symptom log as she just has not felt like using it.    Suicidal/Homicidal: Nowithout intent/plan  Therapist Response:, reviewed symptoms, discussed stressors, facilitated expression of thoughts and feelings, validated feelings, discussed self compassion, assisted patient identify realistic expectations of self, assisted patient identify/challenge/and replace negative thoughts about self and performance with more helpful thoughts, developed plan with patient to read replacement thoughts daily, encourage patient to follow through with medication management appointment with Dr. Modesta Messing, develop plan with patient to begin using mood symptom monitoring form daily Plan: Return again in 2 weeks.  Diagnosis: Axis I: Bipolar Disorder    Axis II: No diagnosis    [name], LCSW 01/04/2020

## 2020-01-05 ENCOUNTER — Other Ambulatory Visit: Payer: Self-pay

## 2020-01-05 ENCOUNTER — Telehealth (INDEPENDENT_AMBULATORY_CARE_PROVIDER_SITE_OTHER): Admitting: Psychiatry

## 2020-01-05 ENCOUNTER — Encounter (HOSPITAL_COMMUNITY): Payer: Self-pay | Admitting: Psychiatry

## 2020-01-05 DIAGNOSIS — G4700 Insomnia, unspecified: Secondary | ICD-10-CM | POA: Diagnosis not present

## 2020-01-05 DIAGNOSIS — F3130 Bipolar disorder, current episode depressed, mild or moderate severity, unspecified: Secondary | ICD-10-CM | POA: Diagnosis not present

## 2020-01-05 MED ORDER — LURASIDONE HCL 20 MG PO TABS: 20.0000 mg | ORAL_TABLET | Freq: Every day | ORAL | 1 refills | Status: DC

## 2020-01-05 NOTE — Patient Instructions (Signed)
1.Continuefluoxetine40 mg daily 2. Discontinue lamotrigine  3. Start Latuda 20 mg daily  4. Start Lunesta 1 mg at night as needed for insomnia 5. Next appointment:8/5 at 8:30

## 2020-01-11 ENCOUNTER — Ambulatory Visit: Admitting: Family Medicine

## 2020-01-25 ENCOUNTER — Ambulatory Visit: Admitting: Family Medicine

## 2020-01-25 ENCOUNTER — Other Ambulatory Visit: Payer: Self-pay

## 2020-01-25 ENCOUNTER — Ambulatory Visit (INDEPENDENT_AMBULATORY_CARE_PROVIDER_SITE_OTHER): Admitting: Psychiatry

## 2020-01-25 DIAGNOSIS — F3130 Bipolar disorder, current episode depressed, mild or moderate severity, unspecified: Secondary | ICD-10-CM

## 2020-01-25 NOTE — Progress Notes (Signed)
Virtual Visit via Video Note  I connected with [name] on 01/25/20 at 4:05 PM EDT by a video enabled telemedicine application and verified that I am speaking with the correct person using two identifiers.   I discussed the limitations of evaluation and management by telemedicine and the availability of in person appointments. The patient expressed understanding and agreed to proceed.  I provided 30  minutes of non-face-to-face time during this encounter.   [name], LCSW  THERAPIST PROGRESS NOTE  Location:  Patient - Home/ Provider - [place] office  Session Time: Wednesday 01/25/2020 4:05 PM - 4:35 PM   Participation Level: Active  Behavioral Response: Alert, anxious   Type of Therapy: Individual Therapy  Treatment Goals addressed:  learn and implement cognitive and behavioral strategies to overcome depression and cope with anxiety  Interventions: CBT and Supportive  Summary: Sharon Merritt is a 44 y.o. female who is referred for services by psychiatrist Dr. Modesta Messing due to patient experiencing symptoms of anxiety and depression. She denies any psychiatric hospitalizations and any previous involvement in outpatient therapy.  Patient reports experiencing anxiety and depression intermittently since high school.  Current depressive symptoms started last year and include social withdrawal, excessive sleeping, deep sadness, and crying spells.  She reports constant worry about the safety of her family and pets and constant thoughts of "what if".  Patient last was seen via virtual visit about 3 weeks ago.   She reports improved and stable mood since taking Latuda as prescribed by psychiatrist Dr. Modesta Messing.  Patient states being less fatigue, less nauseated, and thinking more clearly.  However,.  She reports increased stress and worry as husband recently had a bad reaction to medication he was taking in preparation for a lung transplant.  He had an inflammatory response  and has been back and forth to the ER in the doctor.  Medicine was discontinued today.  Patient worries about her husband.  She also reports increased stress as she is assuming a lot of his responsibilities and his lung functioning capacity is only 60%.  She has had to take time off work resulting in thoughts of letting people down and feelings of guilt.  Suicidal/Homicidal: Nowithout intent/plan  Therapist Response:, reviewed symptoms, discussed stressors, facilitated expression of thoughts and feelings, validated feelings, discussed role of self-care in coping with stress and depression, assisted patient identify ways to improve self-care regarding eating/sleeping/exercise/and relaxation activities,  assisted patient identify realistic expectations of self and ways to achieve balance through use of daily planning, assisted patient identify/challenge/and replace negative thoughts about taking time off work with more helpful thoughts, will send patient daily planning form developed plan with patient to read replacement thoughts daily, encourage patient to follow through with medication management appointment with Dr. Modesta Messing, develop plan with patient to begin using mood symptom monitoring form daily Plan: Return again in 2 weeks.  Diagnosis: Axis I: Bipolar Disorder    Axis II: No diagnosis    [name], LCSW 01/25/2020

## 2020-02-13 ENCOUNTER — Other Ambulatory Visit: Payer: Self-pay

## 2020-02-13 ENCOUNTER — Ambulatory Visit
Admission: EM | Admit: 2020-02-13 | Discharge: 2020-02-13 | Disposition: A | Discharge status: Home / Self Care | Attending: Emergency Medicine | Admitting: Emergency Medicine

## 2020-02-13 DIAGNOSIS — J069 Acute upper respiratory infection, unspecified: Secondary | ICD-10-CM | POA: Insufficient documentation

## 2020-02-13 DIAGNOSIS — K1379 Other lesions of oral mucosa: Secondary | ICD-10-CM | POA: Diagnosis present

## 2020-02-13 LAB — POCT RAPID STREP A (OFFICE): Rapid Strep A Screen: NEGATIVE

## 2020-02-13 MED ORDER — CETIRIZINE HCL 10 MG PO CHEW: 10.0000 mg | CHEWABLE_TABLET | Freq: Every day | ORAL | 0 refills | Status: DC

## 2020-02-13 MED ORDER — MAGIC MOUTHWASH: 5.0000 mL | Freq: Every day | ORAL | 0 refills | Status: DC

## 2020-02-13 MED ORDER — FLUTICASONE PROPIONATE 50 MCG/ACT NA SUSP: 2.0000 | Freq: Every day | NASAL | 0 refills | Status: DC

## 2020-02-13 MED ORDER — BENZONATATE 100 MG PO CAPS: 100.0000 mg | ORAL_CAPSULE | Freq: Three times a day (TID) | ORAL | 0 refills | Status: DC

## 2020-02-13 NOTE — ED Triage Notes (Signed)
Pt presents with c/o sore throat and states tounge and mouth feel like its on fire

## 2020-02-13 NOTE — Discharge Instructions (Addendum)
URI symptoms:  COVID testing ordered.  It will take between 2-5 days for test results.  Someone will contact you regarding abnormal results.   In the meantime: You should remain isolated in your home for 10 days from symptom onset AND greater than 72 hours after symptoms resolution (absence of fever without the use of fever-reducing medication and improvement in respiratory symptoms), whichever is longer Get plenty of rest and push fluids Tessalon Perles prescribed for cough Use OTC zyrtec for nasal congestion, runny nose, and/or sore throat Use OTC flonase for nasal congestion and runny nose Use medications daily for symptom relief Use OTC medications like ibuprofen or tylenol as needed fever or pain Call or go to the ED if you have any new or worsening symptoms such as fever, worsening cough, shortness of breath, chest tightness, chest pain, turning blue, changes in mental status, etc...   Mouth pain:  Nystatin prescribed.  Use as directed for between 10-14 days or until symptoms resolve Maintain oral hygiene Rinse mouth with salt water or mouth wash after inhaler use to prevent oral thrush secondary to inhaler use Follow up with PCP as needed Return or go to the ED if you have any new or worsening symptoms such as fever, chills, nausea, vomiting, difficulty swallowing, fatigue, lymph node swelling, unintentional weight loss, excessive night sweats, etc..Marland Kitchen

## 2020-02-13 NOTE — ED Provider Notes (Signed)
[place]   [id number] 02/13/20 Arrival Time: 6606   CC: Mouth pain  SUBJECTIVE: History from: patient.  Sharon Merritt is a 44 y.o. female who presents with congestion, cough, and mouth pain x 1 week.  Hx significant for asthma, and has been using inhaler more recently.  Denies sick exposure to COVID, flu or strep.  Received both COVID 19 vaccines.  Has tried OTC medications without relief.  Denies aggravating factors.  Denies previous symptoms in the past.   Denies fever, chills, wheezing, chest pain, nausea, changes in bowel or bladder habits.    ROS: As per HPI.  All other pertinent ROS negative.     Past Medical History:  Diagnosis Date  . Breast discharge   . [name] infection   . Essential hypertension   . Fibromyalgia   . Lyme disease   . Rocky Mountain spotted fever    Past Surgical History:  Procedure Laterality Date  . TUBAL LIGATION     No Known Allergies No current facility-administered medications on file prior to encounter.   Current Outpatient Medications on File Prior to Encounter  Medication Sig Dispense Refill  . albuterol (VENTOLIN HFA) 108 (90 Base) MCG/ACT inhaler Inhale 2 puffs into the lungs every 6 (six) hours as needed for wheezing or shortness of breath. 18 g 11  . eszopiclone (LUNESTA) 1 MG TABS tablet Take 1 tablet (1 mg total) by mouth at bedtime as needed for sleep. Take immediately before bedtime 30 tablet 0  . FLUoxetine (PROZAC) 40 MG capsule Take 1 capsule (40 mg total) by mouth daily. 90 capsule 0  . fluticasone-salmeterol (ADVAIR HFA) 115-21 MCG/ACT inhaler Inhale 2 puffs into the lungs 2 (two) times daily. 3 Inhaler 3  . LORazepam (ATIVAN) 0.5 MG tablet TAKE 1 TABLET BY MOUTH 2 TIMES DAILY AS NEEDED FOR ANXIETY. 45 tablet 1  . lurasidone (LATUDA) 20 MG TABS tablet Take 1 tablet (20 mg total) by mouth daily. 30 tablet 1  . mometasone (NASONEX) 50 MCG/ACT nasal spray Place 2 sprays into the nose daily. (Patient not taking:  Reported on 11/16/2019) 17 g 0  . naproxen (NAPROSYN) 500 MG tablet TAKE (1) TABLET BY MOUTH TWICE DAILY WITH A MEAL. 60 tablet 0  . nitroGLYCERIN (NITROSTAT) 0.4 MG SL tablet Place 1 tablet (0.4 mg total) under the tongue every 5 (five) minutes as needed for chest pain. 10 tablet 1  . Spacer/Aero-Holding Chambers (AEROCHAMBER PLUS) inhaler Use as instructed 1 each 2  . valACYclovir (VALTREX) 1000 MG tablet Take 1,000 mg by mouth daily as needed.      Social History   Socioeconomic History  . Marital status: Married    Spouse name: [name]   . Number of children: 3  . Years of education: Assoc   . Highest education level: Not on file  Occupational History    Employer: [employer]  Tobacco Use  . Smoking status: Former Smoker    Types: Cigarettes    Quit date: 02/14/2009    Years since quitting: 11.0  . Smokeless tobacco: Never Used  Vaping Use  . Vaping Use: Never used  Substance and Sexual Activity  . Alcohol use: Not Currently    Alcohol/week: 0.0 standard drinks    Comment: Rarely   . Drug use: No  . Sexual activity: Not Currently    Birth control/protection: Surgical  Other Topics Concern  . Not on file  Social History Narrative   Patient lives at home with her husband (  Daniel) and her children.    Patient works full time.   Caffeine- Tea 3-4 times daily.   Patient is right-handed.   Patient has a Geophysicist/field seismologist.         Social Determinants of Health   Financial Resource Strain:   . Difficulty of Paying Living Expenses:   Food Insecurity:   . Worried About Charity fundraiser in the Last Year:   . Arboriculturist in the Last Year:   Transportation Needs:   . Film/video editor (Medical):   Marland Kitchen Lack of Transportation (Non-Medical):   Physical Activity:   . Days of Exercise per Week:   . Minutes of Exercise per Session:   Stress:   . Feeling of Stress :   Social Connections:   . Frequency of Communication with Friends and Family:   . Frequency of  Social Gatherings with Friends and Family:   . Attends Religious Services:   . Active Member of Clubs or Organizations:   . Attends Archivist Meetings:   Marland Kitchen Marital Status:   Intimate Partner Violence:   . Fear of Current or Ex-Partner:   . Emotionally Abused:   Marland Kitchen Physically Abused:   . Sexually Abused:    Family History  Problem Relation Age of Onset  . COPD Mother   . Depression Mother   . Diabetes Father   . Hypertension Father   . Stroke Father   . Bipolar disorder Sister   . Post-traumatic stress disorder Sister   . Alcohol abuse Sister   . Drug abuse Sister   . Pulmonary embolism Sister   . Bipolar disorder Cousin   . Schizophrenia Cousin     OBJECTIVE:  Vitals:   02/13/20 1019  BP: 134/90  Pulse: 77  Resp: 18  Temp: 98 F (36.7 C)  SpO2: 94%     General appearance: alert; well-appearingnontoxic; speaking in full sentences and tolerating own secretions HEENT: NCAT; Ears: EACs clear, TMs pearly gray; Eyes: PERRL.  EOM grossly intact. Nose: nares patent without rhinorrhea, Throat: oropharynx clear, tonsils non erythematous or enlarged, uvula midline. Tongue with flesh colored papules, no blisters or obvious white plague Neck: supple without LAD Lungs: unlabored respirations, symmetrical air entry; cough: absent; no respiratory distress; CTAB Heart: regular rate and rhythm.   Skin: warm and dry Psychological: alert and cooperative; normal mood and affect  LABS:  Results for orders placed or performed during the hospital encounter of 02/13/20 (from the past 24 hour(s))  POCT rapid strep A     Status: None   Collection Time: 02/13/20 10:31 AM  Result Value Ref Range   Rapid Strep A Screen Negative Negative     ASSESSMENT & PLAN:  1. Viral URI with cough   2. Mouth pain     Meds ordered this encounter  Medications  . benzonatate (TESSALON) 100 MG capsule    Sig: Take 1 capsule (100 mg total) by mouth every 8 (eight) hours.    Dispense:  21  capsule    Refill:  0    Order Specific Question:   Supervising Provider    Answer:   Raylene Everts [[id number]]  . cetirizine (ZYRTEC) 10 MG chewable tablet    Sig: Chew 1 tablet (10 mg total) by mouth daily.    Dispense:  20 tablet    Refill:  0    Order Specific Question:   Supervising Provider    Answer:   Raylene Everts [[id number]]  .  fluticasone (FLONASE) 50 MCG/ACT nasal spray    Sig: Place 2 sprays into both nostrils daily.    Dispense:  16 g    Refill:  0    Order Specific Question:   Supervising Provider    Answer:   Raylene Everts [[id number]]  . magic mouthwash SOLN    Sig: Take 5 mLs by mouth daily.    Dispense:  100 mL    Refill:  0    Order Specific Question:   Supervising Provider    Answer:   Raylene Everts [[id number]]   Strep negative  URI symptoms:  COVID testing ordered.  It will take between 2-5 days for test results.  Someone will contact you regarding abnormal results.   In the meantime: You should remain isolated in your home for 10 days from symptom onset AND greater than 72 hours after symptoms resolution (absence of fever without the use of fever-reducing medication and improvement in respiratory symptoms), whichever is longer Get plenty of rest and push fluids Tessalon Perles prescribed for cough Use OTC zyrtec for nasal congestion, runny nose, and/or sore throat Use OTC flonase for nasal congestion and runny nose Use medications daily for symptom relief Use OTC medications like ibuprofen or tylenol as needed fever or pain Call or go to the ED if you have any new or worsening symptoms such as fever, worsening cough, shortness of breath, chest tightness, chest pain, turning blue, changes in mental status, etc...   Mouth pain:  Nystatin prescribed.  Use as directed for between 10-14 days or until symptoms resolve Maintain oral hygiene Rinse mouth with salt water or mouth wash after inhaler use to prevent oral thrush secondary to inhaler  use Follow up with PCP as needed Return or go to the ED if you have any new or worsening symptoms such as fever, chills, nausea, vomiting, difficulty swallowing, fatigue, lymph node swelling, unintentional weight loss, excessive night sweats, etc...    Reviewed expectations re: course of current medical issues. Questions answered. Outlined signs and symptoms indicating need for more acute intervention. Patient verbalized understanding. After Visit Summary given.         Lestine Box, PA-C 02/13/20 1056

## 2020-02-15 ENCOUNTER — Ambulatory Visit (INDEPENDENT_AMBULATORY_CARE_PROVIDER_SITE_OTHER): Admitting: Psychiatry

## 2020-02-15 ENCOUNTER — Other Ambulatory Visit: Payer: Self-pay

## 2020-02-15 DIAGNOSIS — F3130 Bipolar disorder, current episode depressed, mild or moderate severity, unspecified: Secondary | ICD-10-CM

## 2020-02-15 LAB — SARS-COV-2, NAA 2 DAY TAT

## 2020-02-15 LAB — NOVEL CORONAVIRUS, NAA: SARS-CoV-2, NAA: NOT DETECTED

## 2020-02-15 NOTE — Progress Notes (Signed)
Virtual Visit via Video Note  I connected with Sharon Merritt on 02/15/20 at 4:10 PM by a video enabled telemedicine application and verified that I am speaking with the correct person using two identifiers.   I discussed the limitations of evaluation and management by telemedicine and the availability of in person appointments. The patient expressed understanding and agreed to proceed.  I provided 32 minutes of non-face-to-face time during this encounter.   [name], LCSW   THERAPIST PROGRESS NOTE  Location:  Patient - Home/ Provider - [place] office  Session Time: Wednesday 02/13/2020 4:10 PM - 4:42 PM  Participation Level: Active  Behavioral Response: Alert, anxious   Type of Therapy: Individual Therapy  Treatment Goals addressed:  learn and implement cognitive and behavioral strategies to overcome depression and cope with anxiety  Interventions: CBT and Supportive  Summary: Sharon Merritt is a 44 y.o. female who is referred for services by psychiatrist Dr. Modesta Messing due to patient experiencing symptoms of anxiety and depression. She denies any psychiatric hospitalizations and any previous involvement in outpatient therapy.  Patient reports experiencing anxiety and depression intermittently since high school.  Current depressive symptoms started last year and include social withdrawal, excessive sleeping, deep sadness, and crying spells.  She reports constant worry about the safety of her family and pets and constant thoughts of "what if".  Patient last was seen via virtual visit about 3 weeks ago.   She reports fatigue and wanting to be in bed more for the past two days. She has been sick with sore throat and took COVID test but it came back negative. She reports having negative thoughts about self for not doing more activities for past 2 days. She reports she was starting to feel a little down prior to becoming sick but cannot identify any triggers.  She reports  she has not been using mood symptom monitoring form.  She reports less worry about her husband as he is feeling better.  They are scheduled to see his pulmonologist later this month to determine next steps.  Suicidal/Homicidal: Nowithout intent/plan  Therapist Response:, reviewed symptoms, assisted patient try to identify triggers of mood changes, reviewed rationale for completing mood symptom monitoring forms, assisted patient link values with treatment outcomes, assisted patient identify/challenge/and replace negative should thoughts  about self with more helpful thoughts   Plan: Return again in 2 weeks.  Diagnosis: Axis I: Bipolar Disorder    Axis II: No diagnosis    [name], LCSW 02/15/2020

## 2020-02-16 LAB — CULTURE, GROUP A STREP (THRC)
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
## 2020-02-28 ENCOUNTER — Encounter (HOSPITAL_COMMUNITY): Payer: Self-pay | Admitting: Psychiatry

## 2020-02-28 ENCOUNTER — Telehealth (INDEPENDENT_AMBULATORY_CARE_PROVIDER_SITE_OTHER): Admitting: Psychiatry

## 2020-02-28 ENCOUNTER — Other Ambulatory Visit: Payer: Self-pay

## 2020-02-28 DIAGNOSIS — F3131 Bipolar disorder, current episode depressed, mild: Secondary | ICD-10-CM

## 2020-02-28 MED ORDER — FLUOXETINE HCL 40 MG PO CAPS: 40.0000 mg | ORAL_CAPSULE | Freq: Every day | ORAL | 0 refills | Status: DC

## 2020-02-28 MED ORDER — LURASIDONE HCL 40 MG PO TABS: 40.0000 mg | ORAL_TABLET | Freq: Every day | ORAL | 1 refills | Status: DC

## 2020-02-28 NOTE — Patient Instructions (Signed)
1.Continuefluoxetine40 mg daily 2. Increase Latuda 40 mg daily  3. Discontinue Lunesta 4.Next appointment:10/19 at 8:50

## 2020-03-14 ENCOUNTER — Ambulatory Visit (INDEPENDENT_AMBULATORY_CARE_PROVIDER_SITE_OTHER): Admitting: Psychiatry

## 2020-03-14 ENCOUNTER — Other Ambulatory Visit: Payer: Self-pay

## 2020-03-14 DIAGNOSIS — F3131 Bipolar disorder, current episode depressed, mild: Secondary | ICD-10-CM | POA: Diagnosis not present

## 2020-03-14 NOTE — Progress Notes (Signed)
Virtual Visit via Video Note  I connected with Sharon Merritt on 03/14/20 at 3:10 PM EDTby a video enabled telemedicine application and verified that I am speaking with the correct person using two identifiers.   I discussed the limitations of evaluation and management by telemedicine and the availability of in person appointments. The patient expressed understanding and agreed to proceed.   I provided 45 minutes of non-face-to-face time during this encounter.   [name], LCSW  THERAPIST PROGRESS NOTE  Location:  Patient - Home/ Provider - [facility] [place] office  Session Time: Wednesday 03/14/2020 3:10 PM  - 3:55 PM   Participation Level: Active  Behavioral Response: Alert, anxious   Type of Therapy: Individual Therapy  Treatment Goals addressed:  learn and implement cognitive and behavioral strategies to overcome depression and cope with anxiety  Interventions: CBT and Supportive  Summary: Sharon Merritt is a 44 y.o. female who is referred for services by psychiatrist Dr. Modesta Messing due to patient experiencing symptoms of anxiety and depression. She denies any psychiatric hospitalizations and any previous involvement in outpatient therapy.  Patient reports experiencing anxiety and depression intermittently since high school.  Current depressive symptoms started last year and include social withdrawal, excessive sleeping, deep sadness, and crying spells.  She reports constant worry about the safety of her family and pets and constant thoughts of "what if".  Patient last was seen via virtual visit about 3 weeks ago.   She reports improved mood since last session.  Per her report, she has decreased napping during the day and is sleeping better at night.  She is resuming normal interest in activities and is completing projects.  She is looking forward to [place] and Christmas.  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   She reports being pleased with her performance and her use of self talk to manage stress/anxiety while performing her job duties.  She continues to have concern about her husband's condition but is not overwhelmed by this at this point.  Patient reports still experiencing fatigue.  Although she has decreased napping during the day, she reports having to force self to stay awake at times.  Patient expresses desire to increase energy and improved self-care.     Suicidal/Homicidal: Nowithout intent/plan  Therapist Response:, reviewed symptoms, praised and reinforced patient's increased involvement in activities/initiative to cover a position at work, discussed effects, discussed role of self-care in managing stress and depression, began to discuss behavioral targets regarding improving self-care, developed plan with patient to do menu planning for a week, assisted patient identify and address thoughts/processes that may inhibit implementation of plan, assisted patient identify benefits of implementing plan   Plan: Return again in 2 weeks.  Diagnosis: Axis I: Bipolar Disorder    Axis II: No diagnosis    [name], LCSW 03/14/2020

## 2020-03-28 ENCOUNTER — Ambulatory Visit (INDEPENDENT_AMBULATORY_CARE_PROVIDER_SITE_OTHER): Admitting: Psychiatry

## 2020-03-28 ENCOUNTER — Other Ambulatory Visit: Payer: Self-pay

## 2020-03-28 DIAGNOSIS — F3131 Bipolar disorder, current episode depressed, mild: Secondary | ICD-10-CM

## 2020-03-28 NOTE — Progress Notes (Signed)
Virtual Visit via Video Note  I connected with Sharon Merritt on 03/28/20 at 3:10 PM EDT by a video enabled telemedicine application and verified that I am speaking with the correct person using two identifiers.   I discussed the limitations of evaluation and management by telemedicine and the availability of in person appointments. The patient expressed understanding and agreed to proceed.  I provided 45 minutes of non-face-to-face time during this encounter.   [name], LCSW  THERAPIST PROGRESS NOTE  Location:  Patient - Home/ Provider - [facility] [place] office  Session Time: Wednesday 03/28/2020 3:10 PM  - 3:55 PM   Participation Level: Active  Behavioral Response: Alert, anxious   Type of Therapy: Individual Therapy  Treatment Goals addressed:  learn and implement cognitive and behavioral strategies to overcome depression and cope with anxiety  Interventions: CBT and Supportive  Summary: Sharon Merritt is a 44 y.o. female who is referred for services by psychiatrist Dr. Modesta Messing due to patient experiencing symptoms of anxiety and depression. She denies any psychiatric hospitalizations and any previous involvement in outpatient therapy.  Patient reports experiencing anxiety and depression intermittently since high school.  Current depressive symptoms started last year and include social withdrawal, excessive sleeping, deep sadness, and crying spells.  She reports constant worry about the safety of her family and pets and constant thoughts of "what if".  Patient last was seen via virtual visit about 3 weeks ago.   She reports continued improved mood since last session.  Per her report, she has eliminated napping during the day and is sleeping better at night.   She reports implementing plan to improve self-care regarding eating patterns by doing menu planning and changing her diet.  She reports now having more energy and is pleased with a 10 pound weight loss since last  session.  She reports increased involvement in activities including making T-shirts and cleaning/organizing her office as well as other projects.  She states being happy and looking forward to the holidays.  She reports husband learned he has been disqualified for lung transplant as he has had 2 previous open heart surgeries.  His lung disease is progressive but doctors have offered him an medication.  However side effects are pretty severe and husband has not made up his mind about the treatment.  Patient expresses sadness about this but denies being overwhelmed.  She reports she and husband  are coping well.  Suicidal/Homicidal: Nowithout intent/plan  Therapist Response:, reviewed symptoms, praised and reinforced patient's efforts implementing plan to improve self-care, discussed effects, assisted patient identify ways to maintain consistency regarding her efforts, discussed stressors, facilitated expression of thoughts and feelings, validated feelings, reviewed treatment plan, obtained patient's permission to initial plan as this was a virtual visit, discussed next steps for treatment to include continuing to identify and replace anxiety provoking thoughts/improve assertiveness skills/learn and implement relapse prevention strategies, will send patient handouts (basic personal rights, early warning signs of depression) in preparation for next session  Plan: Return again in 2 weeks.  Diagnosis: Axis I: Bipolar Disorder    Axis II: No diagnosis    [name], LCSW 03/28/2020

## 2020-04-02 ENCOUNTER — Telehealth (HOSPITAL_COMMUNITY): Payer: Self-pay

## 2020-04-02 NOTE — Telephone Encounter (Signed)
Medication problem - Telephone call with pt after she left a message stating she is now having head and right side tremors, like she did with [employer]ility from [place].Stated she was not sure if this was occuring at all with lower dosage but for sure now since she went up on Latuda.  Agreed to inform Dr. Modesta Messing to see if she wants her to stop the medication, lower or change it and would contact her back with instructions.

## 2020-04-02 NOTE — Telephone Encounter (Signed)
Please advise her to lower Latuda to 20 mg daily. Advise her to contact the office if her symptoms does not improve. Please also let me know if she needs refill of this lower dose.

## 2020-04-02 NOTE — Telephone Encounter (Signed)
Medication management - message left for patient to lower her Latuda back to 20 mg a day to see if tremors would improve and stop.  Requested pt call our office back if symptoms persisted or if any questions.
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
## 2020-04-17 ENCOUNTER — Encounter (HOSPITAL_COMMUNITY): Payer: Self-pay | Admitting: Psychiatry

## 2020-04-17 ENCOUNTER — Telehealth (INDEPENDENT_AMBULATORY_CARE_PROVIDER_SITE_OTHER): Admitting: Psychiatry

## 2020-04-17 ENCOUNTER — Other Ambulatory Visit: Payer: Self-pay

## 2020-04-17 DIAGNOSIS — F3175 Bipolar disorder, in partial remission, most recent episode depressed: Secondary | ICD-10-CM | POA: Diagnosis not present

## 2020-04-17 MED ORDER — LURASIDONE HCL 20 MG PO TABS: 20.0000 mg | ORAL_TABLET | Freq: Every day | ORAL | 0 refills | Status: DC

## 2020-04-17 MED ORDER — FLUOXETINE HCL 40 MG PO CAPS: 40.0000 mg | ORAL_CAPSULE | Freq: Every day | ORAL | 0 refills | Status: DC

## 2020-04-17 NOTE — Patient Instructions (Signed)
1.Continuefluoxetine40 mg daily 2. Continue Latuda 20 mg daily  3.Next appointment:1/18 at 9:10

## 2020-04-25 ENCOUNTER — Other Ambulatory Visit: Payer: Self-pay

## 2020-04-25 ENCOUNTER — Ambulatory Visit (INDEPENDENT_AMBULATORY_CARE_PROVIDER_SITE_OTHER): Admitting: Psychiatry

## 2020-04-25 DIAGNOSIS — F3175 Bipolar disorder, in partial remission, most recent episode depressed: Secondary | ICD-10-CM

## 2020-04-25 NOTE — Progress Notes (Signed)
Virtual Visit via Telephone Note  I connected with [employer] on 04/25/20 at 3:25 PM EDT  by telephone and verified that I am speaking with the correct person using two identifiers.  Location: Patient: Home Provider: [place] office    I discussed the limitations, risks, security and privacy concerns of performing an evaluation and management service by telephone and the availability of in person appointments. I also discussed with the patient that there may be a patient responsible charge related to this service. The patient expressed understanding and agreed to proceed.    I provided 35 minutes of non-face-to-face time during this encounter.   [name], LCSW  THERAPIST PROGRESS NOTE  Location:  Patient - Home/ Provider - [facility] [place] office  Session Time: Wednesday 04/25/2020 3:25 PM - 4:00 PM  Participation Level: Active  Behavioral Response: Alert, anxious   Type of Therapy: Individual Therapy  Treatment Goals addressed:  learn and implement cognitive and behavioral strategies to overcome depression and cope with anxiety  Interventions: CBT and Supportive  Summary: Merryl Buckels is a 44 y.o. female who is referred for services by psychiatrist Dr. Modesta Messing due to patient experiencing symptoms of anxiety and depression. She denies any psychiatric hospitalizations and any previous involvement in outpatient therapy.  Patient reports experiencing anxiety and depression intermittently since high school.  Current depressive symptoms started last year and include social withdrawal, excessive sleeping, deep sadness, and crying spells.  She reports constant worry about the safety of her family and pets and constant thoughts of "what if".  Patient last was seen via virtual visit about 3 weeks ago.   She reports continued improved mood since last session.  Per her report, she has maintained positive self-care and involvement in activities.  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  She expresses confidence in her ability to do this and is excited.  Patient also reports decreased anxiety.  Per her report, statements from basic personal rights helped improve her ability to set maintain limits especially on her job.   Suicidal/Homicidal: Nowithout intent/plan  Therapist Response:, reviewed symptoms, praised and reinforced patient's continued efforts to improve self-care/efforts to set maintain limits, discussed effects, reviewed basic personal rights with patient to develop coping statements to promote effective assertion, will send patient handout on assertive communication and preparation for next session, provided psychoeducation on lapse versus relapse of depression, assisted patient identify early warning signs of depression and ways to intervene.  Plan: Return again in 2 weeks.  Diagnosis: Axis I: Bipolar Disorder    Axis II: No diagnosis    [name], LCSW 04/25/2020

## 2020-05-09 ENCOUNTER — Ambulatory Visit (INDEPENDENT_AMBULATORY_CARE_PROVIDER_SITE_OTHER): Admitting: Psychiatry

## 2020-05-09 ENCOUNTER — Other Ambulatory Visit: Payer: Self-pay

## 2020-05-09 DIAGNOSIS — F3175 Bipolar disorder, in partial remission, most recent episode depressed: Secondary | ICD-10-CM | POA: Diagnosis not present

## 2020-05-09 NOTE — Progress Notes (Signed)
Virtual Visit via Telephone Note  I connected with [employer] on 05/09/20 at 3:10 PM EST  by telephone and verified that I am speaking with the correct person using two identifiers.  Location: Patient: Home Provider: [place] office    I discussed the limitations, risks, security and privacy concerns of performing an evaluation and management service by telephone and the availability of in person appointments. I also discussed with the patient that there may be a patient responsible charge related to this service. The patient expressed understanding and agreed to proceed.   I provided 41 minutes of non-face-to-face time during this encounter.   Sharon Smoker, LCSW   THERAPIST PROGRESS NOTE  Location:  Patient - Home/ Provider - [facility] [place] office  Session Time: Wednesday 05/09/2020 3:10 PM  - 3:51 PM   Participation Level: Active  Behavioral Response: Alert, anxious   Type of Therapy: Individual Therapy  Treatment Goals addressed:  learn and implement cognitive and behavioral strategies to overcome depression and cope with anxiety  Interventions: CBT and Supportive  Summary: Cianni Manny is a 44 y.o. female who is referred for services by psychiatrist Dr. Modesta Messing due to patient experiencing symptoms of anxiety and depression. She denies any psychiatric hospitalizations and any previous involvement in outpatient therapy.  Patient reports experiencing anxiety and depression intermittently since high school.  Current depressive symptoms started last year and include social withdrawal, excessive sleeping, deep sadness, and crying spells.  She reports constant worry about the safety of her family and pets and constant thoughts of "what if".  Patient last was seen via virtual visit about 3 weeks ago.   She reports continued improved mood since last session.  Per her report, she has maintained positive self-care and involvement in activities.  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 She is excited and nervous. She continues to have some difficulty using assertiveness skills.    Suicidal/Homicidal: Nowithout intent/plan  Therapist Response:, reviewed symptoms, praised and reinforced patient's continued efforts to improve self-care/efforts, normalized some anxiety about starting new position.  Assisted patient identify helpful coping statements, discussed assertive versus aggressive versus passive style of communication, assisted patient began to examine origins of her thoughts about being assertive, reviewed connection between thoughts/mood/behavior and the effects on patient's assertiveness skills, will send patient handouts to review for next session, also developed plan with patient to complete assertiveness skills screening   Plan: Return again in 2 weeks.  Diagnosis: Axis I: Bipolar Disorder    Axis II: No diagnosis    Sharon Smoker, LCSW 05/09/2020

## 2020-05-30 ENCOUNTER — Other Ambulatory Visit: Payer: Self-pay

## 2020-05-30 ENCOUNTER — Ambulatory Visit (HOSPITAL_COMMUNITY): Admitting: Psychiatry

## 2020-06-13 ENCOUNTER — Ambulatory Visit (HOSPITAL_COMMUNITY): Admitting: Psychiatry

## 2020-06-13 ENCOUNTER — Telehealth (HOSPITAL_COMMUNITY): Payer: Self-pay | Admitting: Psychiatry

## 2020-06-13 ENCOUNTER — Other Ambulatory Visit: Payer: Self-pay

## 2020-06-13 NOTE — Telephone Encounter (Signed)
Therapist attempted to contact patient twice via text through care agility platform for scheduled appointment, no response.  Therapist called patient, left voicemail message indicating attempt and requesting patient call office.

## 2020-07-04 ENCOUNTER — Other Ambulatory Visit: Payer: Self-pay

## 2020-07-04 ENCOUNTER — Ambulatory Visit (INDEPENDENT_AMBULATORY_CARE_PROVIDER_SITE_OTHER): Admitting: Psychiatry

## 2020-07-04 DIAGNOSIS — F3175 Bipolar disorder, in partial remission, most recent episode depressed: Secondary | ICD-10-CM

## 2020-07-04 NOTE — Progress Notes (Signed)
Virtual Visit via Video Note  I connected with Sharon Merritt on 07/04/20 at  1:00 PM EST by a video enabled telemedicine application and verified that I am speaking with the correct person using two identifiers.  Location: Patient: Car Provider: [place] office    I discussed the limitations of evaluation and management by telemedicine and the availability of in person appointments. The patient expressed understanding and agreed to proceed.  I provided 22 minutes of non-face-to-face time during this encounter.   [name], LCSW  THERAPIST PROGRESS NOTE  Location:  Patient - Home/ Provider - [place] office  Session Time: Wednesday 07/05/2019 1:10 PM - 1:32 PM   Participation Level: Active  Behavioral Response: Alert, anxious   Type of Therapy: Individual Therapy  Treatment Goals addressed:  learn and implement cognitive and behavioral strategies to overcome depression and cope with anxiety  Interventions: CBT and Supportive  Summary: Sharon Merritt is a 45 y.o. female who is referred for services by psychiatrist Dr. Modesta Messing due to patient experiencing symptoms of anxiety and depression. She denies any psychiatric hospitalizations and any previous involvement in outpatient therapy.  Patient reports experiencing anxiety and depression intermittently since high school.  Current depressive symptoms started last year and include social withdrawal, excessive sleeping, deep sadness, and crying spells.  She reports constant worry about the safety of her family and pets and constant thoughts of "what if".  Patient last was seen via virtual visit about 2 months ago.   She reports positive and stable mood since last session.   Per her report, she has maintained positive self-care and involvement in activities.  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 She states really enjoying the position and learning  new things.  She has improved her assertiveness skills both at work and at home.  She expresses increased confidence and has applied for the postmaster position as well as another position.  She reports increased energy, improved motivation, and enjoying life. Patient is pleased with her progress in therapy. She expresses confidence in her ability to use helpful coping strategies.    Suicidal/Homicidal: Nowithout intent/plan  Therapist Response:, reviewed symptoms, praised and reinforced patient's continued efforts to improve self-care/efforts, praised and reinforced patient's improved use of assertiveness skills, discussed effects, assisted patient examine her thoughts that promote effective assertion, discussed patient's progress in treatment, developed stepdown plan for termination to include 1 more session to develop mental health maintenance plan,  Plan: Return again in 2 weeks.  Diagnosis: Axis I: Bipolar Disorder    Axis II: No diagnosis    [name], LCSW 07/04/2020

## 2020-07-10 ENCOUNTER — Other Ambulatory Visit: Payer: Self-pay

## 2020-07-10 ENCOUNTER — Ambulatory Visit
Admission: RE | Admit: 2020-07-10 | Discharge: 2020-07-10 | Disposition: A | Discharge status: Home / Self Care | Source: Ambulatory Visit | Attending: Emergency Medicine | Admitting: Emergency Medicine

## 2020-07-10 VITALS — BP 139/91 | HR 86 | Temp 98.2°F | Resp 22 | Ht 67.0 in | Wt 243.0 lb

## 2020-07-10 DIAGNOSIS — J029 Acute pharyngitis, unspecified: Secondary | ICD-10-CM

## 2020-07-10 DIAGNOSIS — J069 Acute upper respiratory infection, unspecified: Secondary | ICD-10-CM | POA: Diagnosis not present

## 2020-07-10 DIAGNOSIS — Z1152 Encounter for screening for COVID-19: Secondary | ICD-10-CM

## 2020-07-10 DIAGNOSIS — R0602 Shortness of breath: Secondary | ICD-10-CM

## 2020-07-10 MED ORDER — PREDNISONE 10 MG PO TABS
20.0000 mg | ORAL_TABLET | Freq: Every day | ORAL | 0 refills | Status: DC
Start: 2020-07-10 — End: 2021-02-11

## 2020-07-10 MED ORDER — CETIRIZINE HCL 10 MG PO TABS: 10.0000 mg | ORAL_TABLET | Freq: Every day | ORAL | 0 refills | Status: DC

## 2020-07-10 MED ORDER — BENZONATATE 100 MG PO CAPS
100.0000 mg | ORAL_CAPSULE | Freq: Three times a day (TID) | ORAL | 0 refills | Status: DC | PRN
Start: 2020-07-10 — End: 2021-02-11

## 2020-07-10 MED ORDER — ALBUTEROL SULFATE HFA 108 (90 BASE) MCG/ACT IN AERS: 1.0000 | INHALATION_SPRAY | Freq: Four times a day (QID) | RESPIRATORY_TRACT | 0 refills | Status: DC | PRN

## 2020-07-10 NOTE — Discharge Instructions (Addendum)
COVID testing ordered.  It will take between 2-7 days for test results.  Someone will contact you regarding abnormal results.    Get plenty of rest and push fluids Tessalon Perles prescribed for cough Zyrtec for nasal congestion, runny nose, and/or sore throat Prednisone was prescribed ProAir was prescribed for shortness of breath Use medications daily for symptom relief Use OTC medications like ibuprofen or tylenol as needed fever or pain Call or go to the ED if you have any new or worsening symptoms such as fever, worsening cough, shortness of breath, chest tightness, chest pain, turning blue, changes in mental status, etc..Marland Kitchen

## 2020-07-10 NOTE — ED Triage Notes (Signed)
Pt c/o of SOB, cough, throat sore/itchy, nasal congestion, body aches x 3 days.

## 2020-07-10 NOTE — ED Provider Notes (Signed)
[place]   [id number] 07/10/20 Arrival Time: 1834   CC: COVID symptoms  SUBJECTIVE: History from: patient.  Sharon Merritt is a 45 y.o. female presented to the urgent care for complaint of cough, nasal congestion, sore throat, shortness of breath and body ache for the past 3 days.  Denies sick exposure to COVID, flu or strep.  Denies recent travel.  Has tried OTC medication without relief.  Denies alleviating or aggravating factors.  Denies previous symptoms in the past.   Denies fever, chills, fatigue, sinus pain, rhinorrhea, wheezing, chest pain, nausea, changes in bowel or bladder habits.     ROS: As per HPI.  All other pertinent ROS negative.     Past Medical History:  Diagnosis Date  . Breast discharge   . [name] infection   . Essential hypertension   . Fibromyalgia   . Lyme disease   . Rocky Mountain spotted fever    Past Surgical History:  Procedure Laterality Date  . TUBAL LIGATION     No Known Allergies No current facility-administered medications on file prior to encounter.   Current Outpatient Medications on File Prior to Encounter  Medication Sig Dispense Refill  . FLUoxetine (PROZAC) 40 MG capsule Take 1 capsule (40 mg total) by mouth daily. 90 capsule 0  . fluticasone-salmeterol (ADVAIR HFA) 115-21 MCG/ACT inhaler Inhale 2 puffs into the lungs 2 (two) times daily. 3 Inhaler 3  . lurasidone (LATUDA) 20 MG TABS tablet Take 1 tablet (20 mg total) by mouth daily. 90 tablet 0  . naproxen (NAPROSYN) 500 MG tablet TAKE (1) TABLET BY MOUTH TWICE DAILY WITH A MEAL. 60 tablet 0  . fluticasone (FLONASE) 50 MCG/ACT nasal spray Place 2 sprays into both nostrils daily. (Patient not taking: Reported on 03/14/2020) 16 g 0  . LORazepam (ATIVAN) 0.5 MG tablet TAKE 1 TABLET BY MOUTH 2 TIMES DAILY AS NEEDED FOR ANXIETY. 45 tablet 1  . magic mouthwash SOLN Take 5 mLs by mouth daily. 100 mL 0  . mometasone (NASONEX) 50 MCG/ACT nasal spray Place 2 sprays into the  nose daily. (Patient not taking: Reported on 11/16/2019) 17 g 0  . nitroGLYCERIN (NITROSTAT) 0.4 MG SL tablet Place 1 tablet (0.4 mg total) under the tongue every 5 (five) minutes as needed for chest pain. 10 tablet 1  . Spacer/Aero-Holding Chambers (AEROCHAMBER PLUS) inhaler Use as instructed 1 each 2  . valACYclovir (VALTREX) 1000 MG tablet Take 1,000 mg by mouth daily as needed.      Social History   Socioeconomic History  . Marital status: Married    Spouse name: [name]   . Number of children: 3  . Years of education: Assoc   . Highest education level: Not on file  Occupational History    Employer: [employer]  Tobacco Use  . Smoking status: Former Smoker    Types: Cigarettes    Quit date: 02/14/2009    Years since quitting: 11.4  . Smokeless tobacco: Never Used  Vaping Use  . Vaping Use: Never used  Substance and Sexual Activity  . Alcohol use: Not Currently    Alcohol/week: 0.0 standard drinks    Comment: Rarely   . Drug use: No  . Sexual activity: Not Currently    Birth control/protection: Surgical  Other Topics Concern  . Not on file  Social History Narrative   Patient lives at home with her husband [name]) and her children.    Patient works full time.   Caffeine- Tea 3-4  times daily.   Patient is right-handed.   Patient has a Geophysicist/field seismologist.         Social Determinants of Health   Financial Resource Strain: Not on file  Food Insecurity: Not on file  Transportation Needs: Not on file  Physical Activity: Not on file  Stress: Not on file  Social Connections: Not on file  Intimate Partner Violence: Not on file   Family History  Problem Relation Age of Onset  . COPD Mother   . Depression Mother   . Diabetes Father   . Hypertension Father   . Stroke Father   . Bipolar disorder Sister   . Post-traumatic stress disorder Sister   . Alcohol abuse Sister   . Drug abuse Sister   . Pulmonary embolism Sister   . Bipolar disorder Cousin   .  Schizophrenia Cousin     OBJECTIVE:  Vitals:   07/10/20 1911 07/10/20 1915  BP:  (!) 139/91  Pulse:  86  Resp:  (!) 22  Temp:  98.2 F (36.8 C)  TempSrc:  Oral  SpO2:  93%  Weight: 243 lb (110.2 kg)   Height: 5' 7"  (1.702 m)      General appearance: alert; appears fatigued, but nontoxic; speaking in full sentences and tolerating own secretions HEENT: NCAT; Ears: EACs clear, TMs pearly gray; Eyes: PERRL.  EOM grossly intact. Sinuses: nontender; Nose: nares patent without rhinorrhea, Throat: oropharynx clear, tonsils non erythematous or enlarged, uvula midline  Neck: supple without LAD Lungs: unlabored respirations, symmetrical air entry; cough: moderate; no respiratory distress; CTAB Heart: regular rate and rhythm.  Radial pulses 2+ symmetrical bilaterally Skin: warm and dry Psychological: alert and cooperative; normal mood and affect  LABS:  No results found for this or any previous visit (from the past 24 hour(s)).   ASSESSMENT & PLAN:  1. URI with cough and congestion   2. Encounter for screening for COVID-19   3. Sore throat   4. SOB (shortness of breath)     Meds ordered this encounter  Medications  . benzonatate (TESSALON) 100 MG capsule    Sig: Take 1 capsule (100 mg total) by mouth 3 (three) times daily as needed for cough.    Dispense:  30 capsule    Refill:  0  . cetirizine (ZYRTEC ALLERGY) 10 MG tablet    Sig: Take 1 tablet (10 mg total) by mouth daily.    Dispense:  30 tablet    Refill:  0  . albuterol (VENTOLIN HFA) 108 (90 Base) MCG/ACT inhaler    Sig: Inhale 1-2 puffs into the lungs every 6 (six) hours as needed for wheezing or shortness of breath.    Dispense:  18 g    Refill:  0  . predniSONE (DELTASONE) 10 MG tablet    Sig: Take 2 tablets (20 mg total) by mouth daily.    Dispense:  15 tablet    Refill:  0    Discharge instructions  COVID testing ordered.  It will take between 2-7 days for test results.  Someone will contact you regarding  abnormal results.    Get plenty of rest and push fluids Tessalon Perles prescribed for cough Zyrtec for nasal congestion, runny nose, and/or sore throat Prednisone was prescribed ProAir was prescribed for shortness of breath Use medications daily for symptom relief Use OTC medications like ibuprofen or tylenol as needed fever or pain Call or go to the ED if you have any new or worsening symptoms such as  fever, worsening cough, shortness of breath, chest tightness, chest pain, turning blue, changes in mental status, etc...   Reviewed expectations re: course of current medical issues. Questions answered. Outlined signs and symptoms indicating need for more acute intervention. Patient verbalized understanding. After Visit Summary given.         Emerson Monte, [place] 07/10/20 2009

## 2020-07-12 LAB — COVID-19, FLU A+B NAA
Influenza A, NAA: NOT DETECTED
Influenza B, NAA: NOT DETECTED
SARS-CoV-2, NAA: DETECTED — AB
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
## 2020-07-17 ENCOUNTER — Telehealth (HOSPITAL_COMMUNITY): Admitting: Psychiatry

## 2020-07-18 ENCOUNTER — Telehealth (INDEPENDENT_AMBULATORY_CARE_PROVIDER_SITE_OTHER): Admitting: Psychiatry

## 2020-07-18 ENCOUNTER — Other Ambulatory Visit: Payer: Self-pay

## 2020-07-18 ENCOUNTER — Telehealth (HOSPITAL_COMMUNITY): Admitting: Psychiatry

## 2020-07-18 ENCOUNTER — Encounter: Payer: Self-pay | Admitting: Psychiatry

## 2020-07-18 DIAGNOSIS — F3175 Bipolar disorder, in partial remission, most recent episode depressed: Secondary | ICD-10-CM

## 2020-07-18 MED ORDER — FLUOXETINE HCL 40 MG PO CAPS: 40.0000 mg | ORAL_CAPSULE | Freq: Every day | ORAL | 1 refills | Status: DC

## 2020-07-18 MED ORDER — LURASIDONE HCL 20 MG PO TABS: 20.0000 mg | ORAL_TABLET | Freq: Every day | ORAL | 1 refills | Status: DC

## 2020-07-18 NOTE — Patient Instructions (Signed)
1.Continuefluoxetine40 mg daily 2.Continue Latuda20 mg daily 3.Next appointment: 4/20 at 8:40

## 2020-10-10 ENCOUNTER — Ambulatory Visit: Admitting: Internal Medicine

## 2020-10-17 ENCOUNTER — Telehealth (INDEPENDENT_AMBULATORY_CARE_PROVIDER_SITE_OTHER): Admitting: Psychiatry

## 2020-10-17 ENCOUNTER — Other Ambulatory Visit: Payer: Self-pay

## 2020-10-17 ENCOUNTER — Encounter: Payer: Self-pay | Admitting: Psychiatry

## 2020-10-17 DIAGNOSIS — F3175 Bipolar disorder, in partial remission, most recent episode depressed: Secondary | ICD-10-CM | POA: Diagnosis not present
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
## 2020-11-29 ENCOUNTER — Other Ambulatory Visit: Payer: Self-pay | Admitting: General Surgery

## 2020-11-29 ENCOUNTER — Other Ambulatory Visit (HOSPITAL_COMMUNITY): Payer: Self-pay | Admitting: General Surgery

## 2020-12-07 ENCOUNTER — Other Ambulatory Visit: Payer: Self-pay

## 2020-12-07 ENCOUNTER — Ambulatory Visit (HOSPITAL_COMMUNITY)
Admission: RE | Admit: 2020-12-07 | Discharge: 2020-12-07 | Disposition: A | Discharge status: Home / Self Care | Source: Ambulatory Visit | Attending: General Surgery | Admitting: General Surgery

## 2020-12-07 ENCOUNTER — Other Ambulatory Visit (HOSPITAL_COMMUNITY): Payer: Self-pay | Admitting: General Surgery

## 2020-12-24 ENCOUNTER — Telehealth: Payer: Self-pay

## 2020-12-24 ENCOUNTER — Other Ambulatory Visit: Payer: Self-pay | Admitting: Psychiatry

## 2020-12-24 MED ORDER — LURASIDONE HCL 20 MG PO TABS: 20.0000 mg | ORAL_TABLET | Freq: Every day | ORAL | 1 refills | Status: DC

## 2020-12-24 NOTE — Telephone Encounter (Signed)
Ordered

## 2020-12-24 NOTE — Telephone Encounter (Signed)
pt called needs a refill on the latuda sent to [facility] .

## 2021-01-03 ENCOUNTER — Other Ambulatory Visit: Payer: Self-pay

## 2021-01-03 ENCOUNTER — Encounter: Attending: General Surgery | Admitting: Skilled Nursing Facility1

## 2021-01-03 ENCOUNTER — Encounter: Payer: Self-pay | Admitting: Skilled Nursing Facility1

## 2021-01-03 DIAGNOSIS — Z6837 Body mass index (BMI) 37.0-37.9, adult: Secondary | ICD-10-CM | POA: Insufficient documentation

## 2021-01-03 NOTE — Progress Notes (Signed)
Nutrition Assessment for Bariatric Surgery Medical Nutrition Therapy Appt Start Time: 1:50    End Time: 2:50  Patient was seen on 01/03/2021 for Pre-Operative Nutrition Assessment. Letter of approval faxed to [facility] bariatric surgery program coordinator on 01/03/2021.   Referral stated Supervised Weight Loss (SWL) visits needed: 0  Not cleared at this time:  Pt to follow up for minimum of one more visit to assist pt with progressing through stages of change/further nutrition education. RD advised pt that this follow up visit is not mandated through insurance. Pt verbalized agreement.   Planned surgery: sleeve gastrectomy  Pt expectation of surgery: to lose weight  Pt expectation of dietitian: none stated    NUTRITION ASSESSMENT   Anthropometrics  Start weight at [facility]: 253 lbs (date: 01/03/2021)  Height: 65 in BMI: 42.10 kg/m2     Clinical  Medical hx: Bipolar, Epstein barr infection,  lyme disease, rocky mountain spotted fever, HTN, fibromyalgia  Medications: Advair, albuterol, motrin, Latuda, Prozac, lorazepam   Labs:  Notable signs/symptoms: hip and lower back pain Any previous deficiencies? No  Micronutrient Nutrition Focused Physical Exam: Hair: No issues observed Eyes: No issues observed Mouth: No issues observed Neck: No issues observed Nails: No issues observed Skin: No issues observed  Lifestyle & Dietary Hx  Pt states she has been feeling very level lately.   Pt states she eats on snacks all throughout the day and eats 90% of her meals out.  Pt states she gets lost in what she is doing so is not paying attention to the amount of food she is eating.   Pt arrives with her husband who is struggling to keep his weight and continues to unintentionally lose it.   24-Hr Dietary Recall First Meal: fast food Snack: pop rocks Second Meal: chips Snack: chips Third Meal:  Snack: popcorn and oreo thins Beverages: soda, unsweet tea,    Estimated  Energy Needs Calories: 1500   NUTRITION DIAGNOSIS  Overweight/obesity ([place]-3.3) related to past poor dietary habits and physical inactivity as evidenced by patient w/ planned sleeve surgery following dietary guidelines for continued weight loss.    NUTRITION INTERVENTION  Nutrition counseling (C-1) and education (E-2) to facilitate bariatric surgery goals.  Educated pt on micronutrient deficiencies post surgery and strategies to mitigate that risk   Pre-Op Goals Reviewed with the Patient Track food and beverage intake (pen and paper, MyFitness Pal, Baritastic app, etc.) Make healthy food choices while monitoring portion sizes: make more meals from home using the meal idea sheet Consume 3 meals per day or try to eat every 3-5 hours Avoid concentrated sugars and fried foods Keep sugar & fat in the single digits per serving on food labels Practice CHEWING your food (aim for applesauce consistency) Practice not drinking 15 minutes before, during, and 30 minutes after each meal and snack Avoid all carbonated beverages (ex: soda, sparkling beverages)  Limit caffeinated beverages (ex: coffee, tea, energy drinks) Avoid all sugar-sweetened beverages (ex: regular soda, sports drinks)  Avoid alcohol  Aim for 64-100 ounces of FLUID daily (with at least half of fluid intake being plain water)  Aim for at least 60-80 grams of PROTEIN daily Look for a liquid protein source that contains ?15 g protein and ?5 g carbohydrate (ex: shakes, drinks, shots) Make a list of non-food related activities Physical activity is an important part of a healthy lifestyle so keep it moving! The goal is to reach 150 minutes of exercise per week, including cardiovascular and weight baring  activity.  *Goals that are bolded indicate the pt would like to start working towards these  Handouts Provided Include  Bariatric Surgery handouts (Nutrition Visits, Pre-Op Goals, Protein Shakes, Vitamins & Minerals)  Learning Style  & Readiness for Change Teaching method utilized: Visual & Auditory  Demonstrated degree of understanding via: Teach Back  Readiness Level: contemplative  Barriers to learning/adherence to lifestyle change: lack of control over portions   RD's Notes for Next Visit Assess pts adherence to chosen goals     MONITORING & EVALUATION Dietary intake, weekly physical activity, body weight, and pre-op goals reached at next nutrition visit.    Next Steps  Patient is to follow up at [facility] for Pre-Op Class >2 weeks before surgery for further nutrition education.  Return for next nutrition appt
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
## 2021-01-09 ENCOUNTER — Ambulatory Visit (INDEPENDENT_AMBULATORY_CARE_PROVIDER_SITE_OTHER): Payer: Self-pay | Admitting: Psychology

## 2021-01-09 DIAGNOSIS — F509 Eating disorder, unspecified: Secondary | ICD-10-CM

## 2021-01-14 ENCOUNTER — Other Ambulatory Visit: Payer: Self-pay

## 2021-01-14 ENCOUNTER — Ambulatory Visit: Admitting: Psychiatry

## 2021-01-16 ENCOUNTER — Encounter: Admitting: Skilled Nursing Facility1

## 2021-01-16 ENCOUNTER — Other Ambulatory Visit: Payer: Self-pay

## 2021-01-16 DIAGNOSIS — Z6837 Body mass index (BMI) 37.0-37.9, adult: Secondary | ICD-10-CM

## 2021-01-16 NOTE — Progress Notes (Signed)
Supervised Weight Loss Visit Bariatric Nutrition Education  Sleeve gastrectomy  Pt completed visits.   Pt has cleared nutrition requirements.    NUTRITION ASSESSMENT  Anthropometrics  Start weight at [facility]: 253 lbs (date: 01/03/2021)  Weight: 250 pounds Height: 65 in BMI: 41.70 kg/m2     Clinical  Medical hx: Bipolar, Epstein barr infection,  lyme disease, rocky mountain spotted fever, HTN, fibromyalgia  Medications: Advair, albuterol, motrin, Latuda, Prozac, lorazepam   Labs:  Notable signs/symptoms: hip and lower back pain Any previous deficiencies? No  Lifestyle & Dietary Hx  Pt states she has been working on not eating as much, drinking more water, and drinking less soda. Pt states not drinking mountain dew in the morning has been tough but able to do it. Pt states logging her food was helpful realizing she was over eating and mindlessly eating.  Pt states she started a new job.  Pt states she has been doing hello fresh meals.   Estimated daily fluid intake:  oz Supplements:  Current average weekly physical activity: ADL's  24-Hr Dietary Recall First Meal: sausage and eggs Snack: yogurt Second Meal: pepperoni and cheese and crackers Snack: pop rocks Third Meal: hello fresh Snack:  Beverages: water, tea  Estimated Energy Needs Calories: 1500   NUTRITION DIAGNOSIS  Overweight/obesity ([place]-3.3) related to past poor dietary habits and physical inactivity as evidenced by patient w/ planned sleeve gastrectomy surgery following dietary guidelines for continued weight loss.   NUTRITION INTERVENTION  Nutrition counseling (C-1) and education (E-2) to facilitate bariatric surgery goals.  Pre-Op Goals Progress & New Goals Continue: Track food and beverage intake (pen and paper, MyFitness Pal, Baritastic app, etc.) Continue: Make healthy food choices while monitoring portion sizes: make more meals from home using the meal idea sheet Continue: Aim for 64-100 ounces of  FLUID daily (with at least half of fluid intake being plain water)   Handouts Provided Include    Learning Style & Readiness for Change Teaching method utilized: Visual & Auditory  Demonstrated degree of understanding via: Teach Back  Readiness Level: Action Barriers to learning/adherence to lifestyle change: none identified   MONITORING & EVALUATION Dietary intake, weekly physical activity, body weight, and pre-op goals  Next Steps  Patient is to return to [facility] for pre-op class Pt has completed visits. No further supervised visits required/recomended

## 2021-01-18 ENCOUNTER — Ambulatory Visit: Payer: Self-pay | Admitting: Psychology

## 2021-01-23 ENCOUNTER — Telehealth: Payer: Self-pay

## 2021-01-23 NOTE — Telephone Encounter (Signed)
Could you contact her and advice to have sooner appointment (and please reschedule any way as she is double booked). I think it is the best to have visit to discuss this as I have not seen her since April.

## 2021-01-23 NOTE — Telephone Encounter (Signed)
pt called states that she wanted to speak with you about a letter that she needs.  she states that she is having a gastric sleeve done and needs a letter that expalins her dx, her treatment, her prognosis and if she a ok to have surgery done.   She is going to [facility]  phone # [phone]

## 2021-01-24 NOTE — Telephone Encounter (Signed)
Pt was contacted and appt was made
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
## 2021-02-11 ENCOUNTER — Ambulatory Visit (INDEPENDENT_AMBULATORY_CARE_PROVIDER_SITE_OTHER): Payer: Self-pay | Admitting: Psychiatry

## 2021-02-11 ENCOUNTER — Other Ambulatory Visit: Payer: Self-pay

## 2021-02-11 ENCOUNTER — Encounter: Payer: Self-pay | Admitting: Psychiatry

## 2021-02-11 VITALS — BP 151/103 | HR 86 | Temp 98.3°F | Wt 252.4 lb

## 2021-02-11 DIAGNOSIS — F3175 Bipolar disorder, in partial remission, most recent episode depressed: Secondary | ICD-10-CM

## 2021-02-11 DIAGNOSIS — F411 Generalized anxiety disorder: Secondary | ICD-10-CM

## 2021-02-11 MED ORDER — FLUOXETINE HCL 40 MG PO CAPS: 40.0000 mg | ORAL_CAPSULE | Freq: Every day | ORAL | 1 refills | Status: DC

## 2021-02-11 NOTE — Patient Instructions (Signed)
1. Continue fluoxetine 40 mg daily  2. Continue Latuda 20 mg daily - monitor tremors 3. Next appointment: 10/12 at 9 Am

## 2021-02-14 ENCOUNTER — Telehealth: Payer: Self-pay | Admitting: Psychiatry

## 2021-02-14 NOTE — Telephone Encounter (Signed)
Letter is written to support bariatric surgery.

## 2021-02-27 ENCOUNTER — Ambulatory Visit: Payer: Self-pay | Admitting: Psychiatry

## 2021-02-28 ENCOUNTER — Ambulatory Visit (INDEPENDENT_AMBULATORY_CARE_PROVIDER_SITE_OTHER): Payer: Self-pay | Admitting: Psychology

## 2021-02-28 DIAGNOSIS — F509 Eating disorder, unspecified: Secondary | ICD-10-CM

## 2021-03-25 ENCOUNTER — Other Ambulatory Visit: Payer: Self-pay

## 2021-03-25 ENCOUNTER — Encounter: Attending: General Surgery | Admitting: Skilled Nursing Facility1

## 2021-03-25 DIAGNOSIS — Z6837 Body mass index (BMI) 37.0-37.9, adult: Secondary | ICD-10-CM | POA: Insufficient documentation

## 2021-03-26 NOTE — Progress Notes (Signed)
Pre-Operative Nutrition Class:    Patient was seen on 03/25/2021 for Pre-Operative Bariatric Surgery Education at [facility].    Surgery date: 04/23/2021 Surgery type: sleeve Start weight at [facility]: 253 Weight today: 256 pounds  The following the learning objectives were met by the patient during this course: Identify Pre-Op Dietary Goals and will begin 2 weeks pre-operatively Identify appropriate sources of fluids and proteins  State protein recommendations and appropriate sources pre and post-operatively Identify Post-Operative Dietary Goals and will follow for 2 weeks post-operatively Identify appropriate multivitamin and calcium sources Describe the need for physical activity post-operatively and will follow MD recommendations State when to call healthcare provider regarding medication questions or post-operative complications When having a diagnosis of diabetes understanding hypoglycemia symptoms and the inclusion of 1 complex carbohydrate per meal  Handouts given during class include: Pre-Op Bariatric Surgery Diet Handout Protein Shake Handout Post-Op Bariatric Surgery Nutrition Handout [facility] Program Information Flyer Support Group Information Flyer [facility] Bariatric Supplements Price List  Follow-Up Plan: Patient will follow-up at [facility] 2 weeks post operatively for diet advancement per MD.

## 2021-03-28 ENCOUNTER — Ambulatory Visit: Payer: Self-pay | Admitting: General Surgery

## 2021-04-03 ENCOUNTER — Other Ambulatory Visit (HOSPITAL_COMMUNITY): Payer: Self-pay
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
## 2021-04-05 NOTE — Patient Instructions (Signed)
DUE TO COVID-19 ONLY ONE VISITOR IS ALLOWED TO COME WITH YOU AND STAY IN THE WAITING ROOM ONLY DURING PRE OP AND PROCEDURE.   **NO VISITORS ARE ALLOWED IN THE SHORT STAY AREA OR RECOVERY ROOM!!**  IF YOU WILL BE ADMITTED INTO THE HOSPITAL YOU ARE ALLOWED ONLY TWO SUPPORT PEOPLE DURING VISITATION HOURS ONLY (10AM -8PM)   The support person(s) may change daily. The support person(s) must pass our screening, gel in and out, and wear a mask at all times, including in the patient's room. Patients must also wear a mask when staff or their support person are in the room.  No visitors under the age of 74. Any visitor under the age of 45 must be accompanied by an adult.    COVID SWAB TESTING MUST BE COMPLETED ON:  04/19/21                    From 8 A to 3 P **MUST PRESENT COMPLETED FORM AT TESTING SITE**    [place] (backside of the building) You are not required to quarantine, however you are required to wear a well-fitted mask when you are out and around people not in your household.  Hand Hygiene often Do NOT share personal items Notify your provider if you are in close contact with someone who has COVID or you develop fever 100.4 or greater, new onset of sneezing, cough, sore throat, shortness of breath or body aches.  [place] [place], [address], must go inside of the hospital, NOT A DRIVE THRU!  (Must self quarantine after testing. Follow instructions on handout.)       Your procedure is scheduled on: 04/23/21   Report to [facility] Main Entrance    Report to admitting at : 8:15 AM   Call this number if you have problems the morning of surgery [phone]   May have liquids until : 7:30 AM.   day of surgery  MORNING OF SURGERY DRINK:   DRINK 1 G2 drink [place] ( AT 7:30 AM), DRINK ALL OF THE  G2 DRINK AT ONE TIME.   NO SOLID FOOD AFTER 600 PM THE NIGHT BEFORE YOUR SURGERY. YOU  MAY DRINK CLEAR FLUIDS. THE G2 DRINK YOU DRINK BEFORE YOU LEAVE HOME WILL BE THE LAST FLUIDS YOU DRINK BEFORE SURGERY.  PAIN IS EXPECTED AFTER SURGERY AND WILL NOT BE COMPLETELY ELIMINATED. AMBULATION AND TYLENOL WILL HELP REDUCE INCISIONAL AND GAS PAIN. MOVEMENT IS KEY!  YOU ARE EXPECTED TO BE OUT OF BED WITHIN 4 HOURS OF ADMISSION TO YOUR PATIENT ROOM.  SITTING IN THE RECLINER THROUGHOUT THE DAY IS IMPORTANT FOR DRINKING FLUIDS AND MOVING GAS THROUGHOUT THE GI TRACT.  COMPRESSION STOCKINGS SHOULD BE WORN [place] UNLESS YOU ARE WALKING.   INCENTIVE SPIROMETER SHOULD BE USED EVERY HOUR WHILE AWAKE TO DECREASE POST-OPERATIVE COMPLICATIONS SUCH AS PNEUMONIA.  WHEN DISCHARGED HOME, IT IS IMPORTANT TO CONTINUE TO WALK EVERY HOUR AND USE THE INCENTIVE SPIROMETER EVERY HOUR.   CLEAR LIQUID DIET  Foods Allowed  Foods Excluded  Water, Black Coffee and tea, regular and decaf                             liquids that you cannot  Plain Jell-O in any flavor  (No red)                                           see through such as: Fruit ices (not with fruit pulp)                                     milk, soups, orange juice              Iced Popsicles (No red)                                    All solid food                                   [place] juices Sports drinks like Gatorade (No red) Lightly seasoned clear broth or consume(fat free) Sugar,   Sample Menu Breakfast                                Lunch                                     Supper Cranberry juice                    Beef broth                            Chicken broth Jell-O                                     Grape juice                           Jodoin juice Coffee or tea                        Jell-O                                      Popsicle                                                Coffee or tea                        Coffee or tea       Complete one Gatorade drink the morning of surgery at : 7:30  AM      the day of surgery.     The day of surgery:  Drink ONE (1) Pre-Surgery Clear Ensure or G2 by am the morning of surgery. Drink in one sitting. Do not sip.  This drink was given to you during your hospital  pre-op appointment visit. Nothing else to drink after completing the  Pre-Surgery Clear Ensure or G2.          If you have questions, please contact your surgeon's office.   Oral Hygiene is also important to reduce your risk of infection.                                    Remember - BRUSH YOUR TEETH THE MORNING OF SURGERY WITH YOUR REGULAR TOOTHPASTE   Do NOT smoke after Midnight   Take these medicines the morning of surgery with A SIP OF WATER: fluoxetine,lurasidone.Valtrex and lorazepam as needed.Use inhalers as usual.  DO NOT TAKE ANY ORAL DIABETIC MEDICATIONS DAY OF YOUR SURGERY                              You may not have any metal on your body including hair pins, jewelry, and body piercing             Do not wear make-up, lotions, powders, perfumes/cologne, or deodorant  Do not wear nail polish including gel and S&S, artificial/acrylic nails, or any other type of covering on natural nails including finger and toenails. If you have artificial nails, gel coating, etc. that needs to be removed by a nail salon please have this removed prior to surgery or surgery may need to be canceled/ delayed if the surgeon/ anesthesia feels like they are unable to be safely monitored.   Do not shave  48 hours prior to surgery.    Do not bring valuables to the hospital. [place].   Contacts, dentures or bridgework may not be worn into surgery.   Bring small overnight bag day of surgery.    Patients discharged on the day of surgery will not be allowed to drive home.   Special Instructions: Bring a copy of your healthcare power of attorney and living will documents          the day of surgery if you haven't scanned them before.              Please read over the following fact sheets you were given: IF YOU HAVE QUESTIONS ABOUT YOUR PRE-OP INSTRUCTIONS PLEASE CALL [phone]   [facility] - Preparing for Surgery Before surgery, you can play an important role.  Because skin is not sterile, your skin needs to be as free of germs as possible.  You can reduce the number of germs on your skin by washing with CHG (chlorahexidine gluconate) soap before surgery.  CHG is an antiseptic cleaner which kills germs and bonds with the skin to continue killing germs even after washing. Please DO NOT use if you have an allergy to CHG or antibacterial soaps.  If your skin becomes reddened/irritated stop using the CHG and inform your nurse when you arrive at Short Stay. Do not shave (including legs and underarms) for at least 48 hours prior to the first CHG shower.  You may shave your face/neck. Please follow these instructions carefully:  1.  Shower with CHG Soap the night before surgery and the  morning of Surgery.  2.  If you choose to wash your hair, wash your hair first as usual with your  normal  shampoo.  3.  After you shampoo, rinse your hair and body thoroughly to remove the  shampoo.                           4.  Use CHG as you would any other liquid soap.  You can apply chg directly  to the skin and wash                       Gently with a scrungie or clean washcloth.  5.  Apply the CHG Soap to your body ONLY FROM THE NECK DOWN.   Do not use on face/ open                           Wound or open sores. Avoid contact with eyes, ears mouth and genitals (private parts).                       Wash face,  Genitals (private parts) with your normal soap.             6.  Wash thoroughly, paying special attention to the area where your surgery  will be performed.  7.  Thoroughly rinse your body with warm water from the neck down.  8.  DO NOT shower/wash with your normal soap  after using and rinsing off  the CHG Soap.                9.  Pat yourself dry with a clean towel.            10.  Wear clean pajamas.            11.  Place clean sheets on your bed the night of your first shower and do not  sleep with pets. Day of Surgery : Do not apply any lotions/deodorants the morning of surgery.  Please wear clean clothes to the hospital/surgery center.  FAILURE TO FOLLOW THESE INSTRUCTIONS MAY RESULT IN THE CANCELLATION OF YOUR SURGERY PATIENT SIGNATURE_________________________________  NURSE SIGNATURE__________________________________  ________________________________________________________________________

## 2021-04-08 ENCOUNTER — Encounter (HOSPITAL_COMMUNITY): Payer: Self-pay

## 2021-04-08 ENCOUNTER — Other Ambulatory Visit: Payer: Self-pay

## 2021-04-08 ENCOUNTER — Encounter (HOSPITAL_COMMUNITY)
Admission: RE | Admit: 2021-04-08 | Discharge: 2021-04-08 | Disposition: A | Discharge status: Home / Self Care | Source: Ambulatory Visit | Attending: General Surgery | Admitting: General Surgery

## 2021-04-08 DIAGNOSIS — Z01812 Encounter for preprocedural laboratory examination: Secondary | ICD-10-CM | POA: Insufficient documentation

## 2021-04-08 HISTORY — DX: Anxiety disorder, unspecified: F41.9

## 2021-04-08 HISTORY — DX: Pneumonia, unspecified organism: J18.9

## 2021-04-08 HISTORY — DX: Unspecified asthma, uncomplicated: J45.909

## 2021-04-08 HISTORY — DX: Depression, unspecified: F32.A

## 2021-04-08 LAB — CBC WITH DIFFERENTIAL/PLATELET
Abs Immature Granulocytes: 0.01 10*3/uL (ref 0.00–0.07)
Basophils Absolute: 0 10*3/uL (ref 0.0–0.1)
Basophils Relative: 1 %
Eosinophils Absolute: 0.1 10*3/uL (ref 0.0–0.5)
Eosinophils Relative: 1 %
HCT: 40.9 % (ref 36.0–46.0)
Hemoglobin: 13.2 g/dL (ref 12.0–15.0)
Immature Granulocytes: 0 %
Lymphocytes Relative: 22 %
Lymphs Abs: 1.4 10*3/uL (ref 0.7–4.0)
MCH: 29.3 pg (ref 26.0–34.0)
MCHC: 32.3 g/dL (ref 30.0–36.0)
MCV: 90.9 fL (ref 80.0–100.0)
Monocytes Absolute: 0.6 10*3/uL (ref 0.1–1.0)
Monocytes Relative: 10 %
Neutro Abs: 4.2 10*3/uL (ref 1.7–7.7)
Neutrophils Relative %: 66 %
Platelets: 305 10*3/uL (ref 150–400)
RBC: 4.5 MIL/uL (ref 3.87–5.11)
RDW: 14.5 % (ref 11.5–15.5)
WBC: 6.3 10*3/uL (ref 4.0–10.5)
nRBC: 0 % (ref 0.0–0.2)

## 2021-04-08 LAB — COMPREHENSIVE METABOLIC PANEL
ALT: 18 U/L (ref 0–44)
AST: 21 U/L (ref 15–41)
Albumin: 3.8 g/dL (ref 3.5–5.0)
Alkaline Phosphatase: 71 U/L (ref 38–126)
Anion gap: 7 (ref 5–15)
BUN: 11 mg/dL (ref 6–20)
CO2: 26 mmol/L (ref 22–32)
Calcium: 9.4 mg/dL (ref 8.9–10.3)
Chloride: 103 mmol/L (ref 98–111)
Creatinine, Ser: 0.98 mg/dL (ref 0.44–1.00)
GFR, Estimated: >60 mL/min (ref >60)
Glucose, Bld: 97 mg/dL (ref 70–99)
Potassium: 4.1 mmol/L (ref 3.5–5.1)
Sodium: 136 mmol/L (ref 135–145)
Total Bilirubin: 0.4 mg/dL (ref 0.3–1.2)
Total Protein: 7.5 g/dL (ref 6.5–8.1)

## 2021-04-08 NOTE — Progress Notes (Signed)
COVID Vaccine Completed: NO Date COVID Vaccine completed: COVID vaccine manufacturer: [place] Test: 04/19/21  PCP - Dr. Allyn Kenner Cardiologist - N/A  Chest x-ray - 12/07/20 EKG - 12/07/20 Stress Test -  ECHO -  Cardiac Cath -  Pacemaker/ICD device last checked:  Sleep Study - Yes CPAP - NO  Fasting Blood Sugar -  Checks Blood Sugar _____ times a day  Blood Thinner Instructions: Aspirin Instructions: Last Dose:  Anesthesia review:   Patient denies shortness of breath, fever, cough and chest pain at PAT appointment   Patient verbalized understanding of instructions that were given to them at the PAT appointment. Patient was also instructed that they will need to review over the PAT instructions again at home before surgery.

## 2021-04-10 ENCOUNTER — Telehealth (INDEPENDENT_AMBULATORY_CARE_PROVIDER_SITE_OTHER): Payer: Self-pay | Admitting: Psychiatry

## 2021-04-10 ENCOUNTER — Encounter: Payer: Self-pay | Admitting: Psychiatry

## 2021-04-10 ENCOUNTER — Other Ambulatory Visit: Payer: Self-pay

## 2021-04-10 DIAGNOSIS — F3175 Bipolar disorder, in partial remission, most recent episode depressed: Secondary | ICD-10-CM

## 2021-04-10 DIAGNOSIS — F411 Generalized anxiety disorder: Secondary | ICD-10-CM

## 2021-04-10 NOTE — Patient Instructions (Signed)
1. Continue fluoxetine 40 mg daily  2. Continue Latuda 20 mg daily  3. Next appointment: 11/16 at 10 AM

## 2021-04-18 ENCOUNTER — Other Ambulatory Visit (HOSPITAL_COMMUNITY): Payer: Self-pay

## 2021-04-23 ENCOUNTER — Inpatient Hospital Stay (HOSPITAL_COMMUNITY): Admitting: Anesthesiology

## 2021-04-23 ENCOUNTER — Other Ambulatory Visit: Payer: Self-pay

## 2021-04-23 ENCOUNTER — Encounter (HOSPITAL_COMMUNITY)
Admission: RE | Disposition: A | Discharge status: Home / Self Care | Payer: Self-pay | Source: Ambulatory Visit | Attending: General Surgery

## 2021-04-23 ENCOUNTER — Other Ambulatory Visit (HOSPITAL_COMMUNITY): Payer: Self-pay

## 2021-04-23 ENCOUNTER — Encounter (HOSPITAL_COMMUNITY): Payer: Self-pay | Admitting: General Surgery

## 2021-04-23 ENCOUNTER — Inpatient Hospital Stay (HOSPITAL_COMMUNITY)
Admission: RE | Admit: 2021-04-23 | Discharge: 2021-04-24 | DRG: 621 | Disposition: A | Discharge status: Home / Self Care | Source: Ambulatory Visit | Attending: General Surgery | Admitting: General Surgery

## 2021-04-23 DIAGNOSIS — K449 Diaphragmatic hernia without obstruction or gangrene: Secondary | ICD-10-CM | POA: Diagnosis present

## 2021-04-23 DIAGNOSIS — J45909 Unspecified asthma, uncomplicated: Secondary | ICD-10-CM | POA: Diagnosis present

## 2021-04-23 DIAGNOSIS — Z79899 Other long term (current) drug therapy: Secondary | ICD-10-CM

## 2021-04-23 DIAGNOSIS — F419 Anxiety disorder, unspecified: Secondary | ICD-10-CM | POA: Diagnosis present

## 2021-04-23 DIAGNOSIS — Z20822 Contact with and (suspected) exposure to covid-19: Secondary | ICD-10-CM | POA: Diagnosis present

## 2021-04-23 DIAGNOSIS — I10 Essential (primary) hypertension: Secondary | ICD-10-CM | POA: Diagnosis present

## 2021-04-23 DIAGNOSIS — M1990 Unspecified osteoarthritis, unspecified site: Secondary | ICD-10-CM | POA: Diagnosis present

## 2021-04-23 DIAGNOSIS — Z87891 Personal history of nicotine dependence: Secondary | ICD-10-CM | POA: Diagnosis not present

## 2021-04-23 DIAGNOSIS — K219 Gastro-esophageal reflux disease without esophagitis: Secondary | ICD-10-CM | POA: Diagnosis present

## 2021-04-23 DIAGNOSIS — Z6841 Body Mass Index (BMI) 40.0 and over, adult: Secondary | ICD-10-CM | POA: Diagnosis not present

## 2021-04-23 DIAGNOSIS — Z9851 Tubal ligation status: Secondary | ICD-10-CM

## 2021-04-23 DIAGNOSIS — Z8249 Family history of ischemic heart disease and other diseases of the circulatory system: Secondary | ICD-10-CM

## 2021-04-23 DIAGNOSIS — Z01818 Encounter for other preprocedural examination: Secondary | ICD-10-CM

## 2021-04-23 HISTORY — PX: UPPER GI ENDOSCOPY: SHX6162

## 2021-04-23 HISTORY — PX: LAPAROSCOPIC GASTRIC SLEEVE RESECTION: SHX5895

## 2021-04-23 LAB — PREGNANCY, URINE: Preg Test, Ur: NEGATIVE

## 2021-04-23 LAB — TYPE AND SCREEN
ABO/RH(D): O POS
Antibody Screen: NEGATIVE

## 2021-04-23 LAB — ABO/RH: ABO/RH(D): O POS

## 2021-04-23 LAB — HEMOGLOBIN AND HEMATOCRIT, BLOOD
HCT: 39.7 % (ref 36.0–46.0)
Hemoglobin: 12.8 g/dL (ref 12.0–15.0)

## 2021-04-23 LAB — SARS CORONAVIRUS 2 BY RT PCR (HOSPITAL ORDER, PERFORMED IN ~~LOC~~ HOSPITAL LAB): SARS Coronavirus 2: NEGATIVE

## 2021-04-23 SURGERY — GASTRECTOMY, SLEEVE, LAPAROSCOPIC
Anesthesia: General | Site: Mouth

## 2021-04-23 MED ORDER — DEXAMETHASONE SODIUM PHOSPHATE 4 MG/ML IJ SOLN: 4.0000 mg | INTRAMUSCULAR | Status: DC

## 2021-04-23 MED ORDER — LIDOCAINE 2% (20 MG/ML) 5 ML SYRINGE
INTRAMUSCULAR | Status: DC | PRN
  Administered 2021-04-23: 100 mg via INTRAVENOUS

## 2021-04-23 MED ORDER — HEPARIN SODIUM (PORCINE) 5000 UNIT/ML IJ SOLN: 5000.0000 [IU] | INTRAMUSCULAR | Status: AC

## 2021-04-23 MED ORDER — FENTANYL CITRATE PF 50 MCG/ML IJ SOSY: 25.0000 ug | PREFILLED_SYRINGE | INTRAMUSCULAR | Status: DC | PRN

## 2021-04-23 MED ORDER — KETAMINE HCL 10 MG/ML IJ SOLN
INTRAMUSCULAR | Status: DC | PRN
  Administered 2021-04-23: 30 mg via INTRAVENOUS

## 2021-04-23 MED ORDER — PROMETHAZINE HCL 25 MG/ML IJ SOLN
6.2500 mg | INTRAMUSCULAR | Status: DC | PRN
  Administered 2021-04-23: 6.25 mg via INTRAVENOUS

## 2021-04-23 MED ORDER — MIDAZOLAM HCL 5 MG/5ML IJ SOLN
INTRAMUSCULAR | Status: DC | PRN
  Administered 2021-04-23: 2 mg via INTRAVENOUS

## 2021-04-23 MED ORDER — MOMETASONE FURO-FORMOTEROL FUM 200-5 MCG/ACT IN AERO
2.0000 | INHALATION_SPRAY | Freq: Two times a day (BID) | RESPIRATORY_TRACT | Status: DC
  Administered 2021-04-23 – 2021-04-24 (×2): 2 via RESPIRATORY_TRACT
  Filled 2021-04-23: qty 8.8

## 2021-04-23 MED ORDER — DROPERIDOL 2.5 MG/ML IJ SOLN: 0.6250 mg | Freq: Once | INTRAMUSCULAR | Status: DC | PRN

## 2021-04-23 MED ORDER — ONDANSETRON HCL 4 MG/2ML IJ SOLN
4.0000 mg | INTRAMUSCULAR | Status: DC | PRN
  Administered 2021-04-23: 4 mg via INTRAVENOUS
  Filled 2021-04-23: qty 2

## 2021-04-23 MED ORDER — LACTATED RINGERS IV SOLN: INTRAVENOUS | Status: DC

## 2021-04-23 MED ORDER — PHENYLEPHRINE 40 MCG/ML (10ML) SYRINGE FOR IV PUSH (FOR BLOOD PRESSURE SUPPORT)
PREFILLED_SYRINGE | INTRAVENOUS | Status: AC
  Filled 2021-04-23: qty 10

## 2021-04-23 MED ORDER — LIDOCAINE HCL (PF) 2 % IJ SOLN
INTRAMUSCULAR | Status: AC
  Filled 2021-04-23: qty 5

## 2021-04-23 MED ORDER — PROPOFOL 10 MG/ML IV BOLUS
INTRAVENOUS | Status: DC | PRN
  Administered 2021-04-23: 160 mg via INTRAVENOUS

## 2021-04-23 MED ORDER — FLUOXETINE HCL 20 MG PO CAPS
40.0000 mg | ORAL_CAPSULE | Freq: Every day | ORAL | Status: DC
  Administered 2021-04-24: 40 mg via ORAL
  Filled 2021-04-23: qty 2

## 2021-04-23 MED ORDER — ORAL CARE MOUTH RINSE: 15.0000 mL | Freq: Once | OROMUCOSAL | Status: AC

## 2021-04-23 MED ORDER — SIMETHICONE 80 MG PO CHEW: 80.0000 mg | CHEWABLE_TABLET | Freq: Four times a day (QID) | ORAL | Status: DC | PRN

## 2021-04-23 MED ORDER — LORAZEPAM 0.5 MG PO TABS: 0.5000 mg | ORAL_TABLET | Freq: Two times a day (BID) | ORAL | Status: DC | PRN

## 2021-04-23 MED ORDER — FAMOTIDINE IN NACL 20-0.9 MG/50ML-% IV SOLN
20.0000 mg | Freq: Two times a day (BID) | INTRAVENOUS | Status: DC
  Administered 2021-04-23 – 2021-04-24 (×2): 20 mg via INTRAVENOUS
  Filled 2021-04-23 (×2): qty 50

## 2021-04-23 MED ORDER — SODIUM CHLORIDE 0.9 % IV SOLN
INTRAVENOUS | Status: AC
  Filled 2021-04-23: qty 2

## 2021-04-23 MED ORDER — ROCURONIUM BROMIDE 10 MG/ML (PF) SYRINGE
PREFILLED_SYRINGE | INTRAVENOUS | Status: AC
  Filled 2021-04-23: qty 10

## 2021-04-23 MED ORDER — EPHEDRINE SULFATE-NACL 50-0.9 MG/10ML-% IV SOSY
PREFILLED_SYRINGE | INTRAVENOUS | Status: DC | PRN
  Administered 2021-04-23: 10 mg via INTRAVENOUS
  Administered 2021-04-23: 5 mg via INTRAVENOUS

## 2021-04-23 MED ORDER — PROPOFOL 10 MG/ML IV BOLUS
INTRAVENOUS | Status: AC
  Filled 2021-04-23: qty 20

## 2021-04-23 MED ORDER — BUPIVACAINE LIPOSOME 1.3 % IJ SUSP
INTRAMUSCULAR | Status: DC | PRN
  Administered 2021-04-23: 20 mL

## 2021-04-23 MED ORDER — ACETAMINOPHEN 500 MG PO TABS
ORAL_TABLET | ORAL | Status: AC
  Administered 2021-04-23: 1000 mg via ORAL
  Filled 2021-04-23: qty 2

## 2021-04-23 MED ORDER — ACETAMINOPHEN 500 MG PO TABS
1000.0000 mg | ORAL_TABLET | Freq: Three times a day (TID) | ORAL | Status: DC
  Administered 2021-04-24 (×2): 1000 mg via ORAL
  Filled 2021-04-23 (×3): qty 2

## 2021-04-23 MED ORDER — SCOPOLAMINE 1 MG/3DAYS TD PT72
MEDICATED_PATCH | TRANSDERMAL | Status: AC
  Administered 2021-04-23: 1.5 mg via TRANSDERMAL
  Filled 2021-04-23: qty 1

## 2021-04-23 MED ORDER — PROMETHAZINE HCL 25 MG/ML IJ SOLN
INTRAMUSCULAR | Status: AC
  Filled 2021-04-23: qty 1

## 2021-04-23 MED ORDER — BUPIVACAINE HCL 0.25 % IJ SOLN
INTRAMUSCULAR | Status: DC | PRN
  Administered 2021-04-23: 30 mL

## 2021-04-23 MED ORDER — OXYCODONE HCL 5 MG PO TABS
5.0000 mg | ORAL_TABLET | Freq: Once | ORAL | Status: DC | PRN
Start: 2021-04-23 — End: 2021-04-23

## 2021-04-23 MED ORDER — CHLORHEXIDINE GLUCONATE 0.12 % MT SOLN
15.0000 mL | Freq: Once | OROMUCOSAL | Status: AC
  Administered 2021-04-23: 15 mL via OROMUCOSAL

## 2021-04-23 MED ORDER — ONDANSETRON HCL 4 MG/2ML IJ SOLN
INTRAMUSCULAR | Status: DC | PRN
  Administered 2021-04-23: 4 mg via INTRAVENOUS

## 2021-04-23 MED ORDER — CHLORHEXIDINE GLUCONATE CLOTH 2 % EX PADS: 6.0000 | MEDICATED_PAD | Freq: Once | CUTANEOUS | Status: DC

## 2021-04-23 MED ORDER — ACETAMINOPHEN 160 MG/5ML PO SOLN: 1000.0000 mg | Freq: Three times a day (TID) | ORAL | Status: DC

## 2021-04-23 MED ORDER — APREPITANT 40 MG PO CAPS
ORAL_CAPSULE | ORAL | Status: AC
  Administered 2021-04-23: 40 mg via ORAL
  Filled 2021-04-23: qty 1

## 2021-04-23 MED ORDER — ACETAMINOPHEN 500 MG PO TABS: 1000.0000 mg | ORAL_TABLET | ORAL | Status: AC

## 2021-04-23 MED ORDER — SUGAMMADEX SODIUM 200 MG/2ML IV SOLN
INTRAVENOUS | Status: DC | PRN
Start: 2021-04-23 — End: 2021-04-23
  Administered 2021-04-23: 200 mg via INTRAVENOUS

## 2021-04-23 MED ORDER — PHENYLEPHRINE 40 MCG/ML (10ML) SYRINGE FOR IV PUSH (FOR BLOOD PRESSURE SUPPORT)
PREFILLED_SYRINGE | INTRAVENOUS | Status: DC | PRN
  Administered 2021-04-23: 120 ug via INTRAVENOUS
  Administered 2021-04-23: 40 ug via INTRAVENOUS

## 2021-04-23 MED ORDER — FENTANYL CITRATE (PF) 100 MCG/2ML IJ SOLN
INTRAMUSCULAR | Status: DC | PRN
  Administered 2021-04-23: 100 ug via INTRAVENOUS
  Administered 2021-04-23 (×3): 50 ug via INTRAVENOUS

## 2021-04-23 MED ORDER — PHENYLEPHRINE HCL (PRESSORS) 10 MG/ML IV SOLN
INTRAVENOUS | Status: AC
  Filled 2021-04-23: qty 2

## 2021-04-23 MED ORDER — LURASIDONE HCL 20 MG PO TABS
20.0000 mg | ORAL_TABLET | Freq: Every day | ORAL | Status: DC
  Administered 2021-04-24: 20 mg via ORAL
  Filled 2021-04-23 (×2): qty 1

## 2021-04-23 MED ORDER — MORPHINE SULFATE (PF) 2 MG/ML IV SOLN: 1.0000 mg | INTRAVENOUS | Status: DC | PRN

## 2021-04-23 MED ORDER — GABAPENTIN 300 MG PO CAPS: 300.0000 mg | ORAL_CAPSULE | ORAL | Status: AC

## 2021-04-23 MED ORDER — MIDAZOLAM HCL 2 MG/2ML IJ SOLN
INTRAMUSCULAR | Status: AC
  Filled 2021-04-23: qty 2

## 2021-04-23 MED ORDER — DEXTROSE-NACL 5-0.45 % IV SOLN: INTRAVENOUS | Status: DC

## 2021-04-23 MED ORDER — GABAPENTIN 300 MG PO CAPS
ORAL_CAPSULE | ORAL | Status: AC
  Administered 2021-04-23: 300 mg via ORAL
  Filled 2021-04-23: qty 1

## 2021-04-23 MED ORDER — APREPITANT 40 MG PO CAPS: 40.0000 mg | ORAL_CAPSULE | ORAL | Status: AC

## 2021-04-23 MED ORDER — SODIUM CHLORIDE 0.9 % IR SOLN
Status: DC | PRN
  Administered 2021-04-23: 1

## 2021-04-23 MED ORDER — SODIUM CHLORIDE 0.9 % IV SOLN
12.5000 mg | Freq: Four times a day (QID) | INTRAVENOUS | Status: DC | PRN
  Administered 2021-04-23: 19:00:00 12.5 mg via INTRAVENOUS
  Filled 2021-04-23: qty 12.5
  Filled 2021-04-23: qty 0.5

## 2021-04-23 MED ORDER — LIDOCAINE HCL (PF) 2 % IJ SOLN
INTRAMUSCULAR | Status: DC | PRN
  Administered 2021-04-23: 1.5 mg/kg/h via INTRADERMAL

## 2021-04-23 MED ORDER — BUPIVACAINE-EPINEPHRINE (PF) 0.25% -1:200000 IJ SOLN
INTRAMUSCULAR | Status: AC
  Filled 2021-04-23: qty 30

## 2021-04-23 MED ORDER — PHENYLEPHRINE HCL (PRESSORS) 10 MG/ML IV SOLN
INTRAVENOUS | Status: AC
  Filled 2021-04-23: qty 1

## 2021-04-23 MED ORDER — ENOXAPARIN SODIUM 30 MG/0.3ML IJ SOSY
30.0000 mg | PREFILLED_SYRINGE | Freq: Two times a day (BID) | INTRAMUSCULAR | Status: DC
  Administered 2021-04-23 – 2021-04-24 (×2): 30 mg via SUBCUTANEOUS
  Filled 2021-04-23 (×3): qty 0.3

## 2021-04-23 MED ORDER — OXYCODONE HCL 5 MG/5ML PO SOLN: 5.0000 mg | Freq: Once | ORAL | Status: DC | PRN

## 2021-04-23 MED ORDER — KETAMINE HCL 10 MG/ML IJ SOLN
INTRAMUSCULAR | Status: AC
  Filled 2021-04-23: qty 1

## 2021-04-23 MED ORDER — BUPIVACAINE LIPOSOME 1.3 % IJ SUSP: 20.0000 mL | Freq: Once | INTRAMUSCULAR | Status: DC

## 2021-04-23 MED ORDER — FENTANYL CITRATE (PF) 250 MCG/5ML IJ SOLN
INTRAMUSCULAR | Status: AC
  Filled 2021-04-23: qty 5

## 2021-04-23 MED ORDER — ALBUTEROL SULFATE (2.5 MG/3ML) 0.083% IN NEBU: 3.0000 mL | INHALATION_SOLUTION | Freq: Every day | RESPIRATORY_TRACT | Status: DC | PRN

## 2021-04-23 MED ORDER — ROCURONIUM BROMIDE 10 MG/ML (PF) SYRINGE
PREFILLED_SYRINGE | INTRAVENOUS | Status: DC | PRN
  Administered 2021-04-23: 60 mg via INTRAVENOUS

## 2021-04-23 MED ORDER — HYDRALAZINE HCL 10 MG PO TABS
10.0000 mg | ORAL_TABLET | Freq: Three times a day (TID) | ORAL | Status: DC | PRN
  Filled 2021-04-23: qty 1

## 2021-04-23 MED ORDER — DEXAMETHASONE SODIUM PHOSPHATE 10 MG/ML IJ SOLN
INTRAMUSCULAR | Status: DC | PRN
  Administered 2021-04-23: 10 mg via INTRAVENOUS

## 2021-04-23 MED ORDER — OXYCODONE HCL 5 MG/5ML PO SOLN
5.0000 mg | Freq: Four times a day (QID) | ORAL | Status: DC | PRN
Start: 2021-04-23 — End: 2021-04-24

## 2021-04-23 MED ORDER — ENSURE MAX PROTEIN PO LIQD
2.0000 [oz_av] | ORAL | Status: DC
  Administered 2021-04-24: 2 [oz_av] via ORAL

## 2021-04-23 MED ORDER — BUPIVACAINE LIPOSOME 1.3 % IJ SUSP
INTRAMUSCULAR | Status: AC
  Filled 2021-04-23: qty 20

## 2021-04-23 MED ORDER — SODIUM CHLORIDE 0.9 % IV SOLN
2.0000 g | INTRAVENOUS | Status: AC
  Administered 2021-04-23: 2 g via INTRAVENOUS

## 2021-04-23 MED ORDER — HEPARIN SODIUM (PORCINE) 5000 UNIT/ML IJ SOLN
INTRAMUSCULAR | Status: AC
  Administered 2021-04-23: 5000 [IU] via SUBCUTANEOUS
  Filled 2021-04-23: qty 1

## 2021-04-23 MED ORDER — SCOPOLAMINE 1 MG/3DAYS TD PT72: 1.0000 | MEDICATED_PATCH | TRANSDERMAL | Status: DC

## 2021-04-23 SURGICAL SUPPLY — 62 items
APL PRP STRL LF DISP 70% ISPRP (MISCELLANEOUS) ×3
APL SKNCLS STERI-STRIP NONHPOA (GAUZE/BANDAGES/DRESSINGS) ×3
APPLIER CLIP ROT 13.4 12 LRG (CLIP) ×4
APR CLP LRG 13.4X12 ROT 20 MLT (CLIP) ×3
BAG COUNTER SPONGE SURGICOUNT (BAG) IMPLANT
BAG LAPAROSCOPIC 12 15 PORT 16 (BASKET) ×3 IMPLANT
BAG RETRIEVAL 12/15 (BASKET) ×4
BAG SPNG CNTER NS LX DISP (BAG)
BENZOIN TINCTURE PRP APPL 2/3 (GAUZE/BANDAGES/DRESSINGS) ×4 IMPLANT
BLADE SURG SZ11 CARB STEEL (BLADE) ×4 IMPLANT
BNDG ADH 1X3 SHEER STRL LF (GAUZE/BANDAGES/DRESSINGS) ×14 IMPLANT
BNDG ADH THN 3X1 STRL LF (GAUZE/BANDAGES/DRESSINGS) ×3
CABLE HIGH FREQUENCY MONO STRZ (ELECTRODE) IMPLANT
CHLORAPREP W/TINT 26 (MISCELLANEOUS) ×4 IMPLANT
CLIP APPLIE ROT 13.4 12 LRG (CLIP) ×1 IMPLANT
COVER SURGICAL LIGHT HANDLE (MISCELLANEOUS) ×4 IMPLANT
DECANTER SPIKE VIAL GLASS SM (MISCELLANEOUS) ×4 IMPLANT
DRAPE UTILITY XL STRL (DRAPES) ×8 IMPLANT
ELECT REM PT RETURN 15FT ADLT (MISCELLANEOUS) ×4 IMPLANT
GAUZE 4X4 16PLY ~~LOC~~+RFID DBL (SPONGE) ×4 IMPLANT
GLOVE SURG POLYISO LF SZ7 (GLOVE) ×4 IMPLANT
GLOVE SURG UNDER POLY LF SZ7 (GLOVE) ×4 IMPLANT
GOWN STRL REUS W/TWL LRG LVL3 (GOWN DISPOSABLE) ×4 IMPLANT
GOWN STRL REUS W/TWL XL LVL3 (GOWN DISPOSABLE) ×12 IMPLANT
GRASPER SUT TROCAR 14GX15 (MISCELLANEOUS) ×4 IMPLANT
IRRIG SUCT STRYKERFLOW 2 WTIP (MISCELLANEOUS) ×4
IRRIGATION SUCT STRKRFLW 2 WTP (MISCELLANEOUS) ×3 IMPLANT
KIT BASIN OR (CUSTOM PROCEDURE TRAY) ×4 IMPLANT
KIT TURNOVER KIT A (KITS) IMPLANT
MARKER SKIN DUAL TIP RULER LAB (MISCELLANEOUS) ×4 IMPLANT
MAT PREVALON FULL STRYKER (MISCELLANEOUS) IMPLANT
NDL SPNL 22GX3.5 QUINCKE BK (NEEDLE) ×2 IMPLANT
NEEDLE SPNL 22GX3.5 QUINCKE BK (NEEDLE) ×4 IMPLANT
PACK UNIVERSAL I (CUSTOM PROCEDURE TRAY) ×4 IMPLANT
RELOAD STAPLE 60 3.6 BLU REG (STAPLE) IMPLANT
RELOAD STAPLE 60 3.8 GOLD REG (STAPLE) IMPLANT
RELOAD STAPLE 60 4.1 GRN THCK (STAPLE) IMPLANT
RELOAD STAPLER BLUE 60MM (STAPLE) ×12 IMPLANT
RELOAD STAPLER GOLD 60MM (STAPLE) ×6 IMPLANT
RELOAD STAPLER GREEN 60MM (STAPLE) IMPLANT
SCISSORS LAP 5X45 EPIX DISP (ENDOMECHANICALS) IMPLANT
SET TUBE SMOKE EVAC HIGH FLOW (TUBING) ×4 IMPLANT
SHEARS HARMONIC ACE PLUS 45CM (MISCELLANEOUS) ×4 IMPLANT
SLEEVE GASTRECTOMY 40FR VISIGI (MISCELLANEOUS) ×4 IMPLANT
SLEEVE XCEL OPT CAN 5 100 (ENDOMECHANICALS) ×8 IMPLANT
SOL ANTI FOG 6CC (MISCELLANEOUS) ×3 IMPLANT
SOLUTION ANTI FOG 6CC (MISCELLANEOUS) ×1
STAPLER ECHELON LONG 60 440 (INSTRUMENTS) ×4 IMPLANT
STAPLER RELOAD BLUE 60MM (STAPLE) ×16
STAPLER RELOAD GOLD 60MM (STAPLE) ×8
STAPLER RELOAD GREEN 60MM (STAPLE)
STRIP CLOSURE SKIN 1/2X4 (GAUZE/BANDAGES/DRESSINGS) ×4 IMPLANT
SUT ETHIBOND 0 36 GRN (SUTURE) IMPLANT
SUT MNCRL AB 4-0 PS2 18 (SUTURE) ×4 IMPLANT
SUT VICRYL 0 TIES 12 18 (SUTURE) ×4 IMPLANT
SYR 20ML LL LF (SYRINGE) ×4 IMPLANT
SYR 50ML LL SCALE MARK (SYRINGE) ×4 IMPLANT
TOWEL OR 17X26 10 PK STRL BLUE (TOWEL DISPOSABLE) ×4 IMPLANT
TOWEL OR NON WOVEN STRL DISP B (DISPOSABLE) ×4 IMPLANT
TROCAR BLADELESS 15MM (ENDOMECHANICALS) ×4 IMPLANT
TROCAR BLADELESS OPT 5 100 (ENDOMECHANICALS) ×4 IMPLANT
TUBING CONNECTING 10 (TUBING) ×8 IMPLANT

## 2021-04-23 NOTE — Progress Notes (Signed)
Discussed QI "Goals for Discharge" document with patient including ambulation in halls, Incentive Spirometry use every hour, and oral care.  Also discussed pain and nausea control.  Enabled or verified head of bed 30 degree alarm activated.  BSTOP education provided including BSTOP information guide, "Guide for Pain Management after your Bariatric Procedure".  Diet progression education provided including "Bariatric Surgery Post-Op Food Plan Phase 1: Liquids".  Questions answered.  Will continue to partner with bedside RN and follow up with patient per protocol.

## 2021-04-23 NOTE — Transfer of Care (Signed)
Immediate Anesthesia Transfer of Care Note  Patient: Sharon Merritt  Procedure(s) Performed: LAPAROSCOPIC GASTRIC SLEEVE RESECTION (Abdomen) UPPER GI ENDOSCOPY (Mouth)  Patient Location: PACU  Anesthesia Type:General  Level of Consciousness: awake, alert , oriented and patient cooperative  Airway & Oxygen Therapy: Patient Spontanous Breathing and Patient connected to face mask  Post-op Assessment: Report given to RN and Post -op Vital signs reviewed and stable  Post vital signs: Reviewed and stable  Last Vitals:  Vitals Value Taken Time  BP    Temp    Pulse 72 04/23/21 1222  Resp 7 04/23/21 1222  SpO2 100 % 04/23/21 1222  Vitals shown include unvalidated device data.  Last Pain:  Vitals:   04/23/21 0856  TempSrc:   PainSc: 0-No pain         Complications: No notable events documented.

## 2021-04-23 NOTE — Op Note (Signed)
   Patient: Sharon Merritt (1976/05/14, [id number])  Date of Surgery: 04/23/2021   Preoperative Diagnosis: MORBID OBESITY   Postoperative Diagnosis: morbid obesity  Surgical Procedure: Upper Endoscopy   Surgeon: Louanna Raw, MD  Anesthesiologist: Nolon Nations, MD CRNA: Claudia Desanctis, CRNA; Eben Burow, CRNA   Anesthesia: General   Fluids:  Total I/O In: 100 [IV [id number]] Out: -   Complications: None  Drains:  None  Specimen: None   Indications for Procedure: Sharon Merritt is a 45 y.o. female undergoing laparoscopic sleeve gastrectomy and an EGD was requested to evaluate foregut anatomy intraoperatively.  Description of Procedure: During the procedure, I scrubbed out and obtained the Olympus endoscope. I gently placed endoscope in the patient's oropharynx and gently glided it down the esophagus without any difficulty under direct visualization.  The scope was advanced as far as the pylrous and then slowly withdrawn to inspect the foregut anatomy.  Dr. Kieth Brightly had placed saline in the upper abdomen and all staple lines were submerged to ensure no air leak. There was no evidence of bubbles. There was no evidence of intraluminal bleeding and the mucosa appeared healthy.  The lumen was widely patent without evidence of stricture.  The intraluminal insufflation was decompressed. The scope was withdrawn. The patient tolerated this portion of the procedure well. Please see Dr Amie Portland operative note for details regarding the remainder of the procedure.    Louanna Raw, MD General, Bariatric, & Minimally Invasive Surgery [facility], [place]

## 2021-04-23 NOTE — Op Note (Signed)
Preop Diagnosis: Obesity Class III  Postop Diagnosis: same  Procedure performed: laparoscopic Sleeve Gastrectomy  Assitant: Louanna Raw  Indications:  The patient is a 45 y.o. year-old morbidly obese female who has been followed in the Bariatric Clinic as an outpatient. This patient was diagnosed with morbid obesity with a BMI of Body mass index is 40 kg/m.  The patient was counseled extensively in the Bariatric Outpatient Clinic and after a thorough explanation of the risks and benefits of surgery (including death from complications, bowel leak, infection such as peritonitis and/or sepsis, internal hernia, bleeding, need for blood transfusion, bowel obstruction, organ failure, pulmonary embolus, deep venous thrombosis, wound infection, incisional hernia, skin breakdown, and others entailed on the consent form) and after a compliant diet and exercise program, the patient was scheduled for an elective laparoscopic sleeve gastrectomy.  Description of Operation:  Following informed consent, the patient was taken to the operating room and placed on the operating table in the supine position.  She had previously received prophylactic antibiotics and subcutaneous heparin for DVT prophylaxis in the pre-op holding area.  After induction of general endotracheal anesthesia by the anesthesiologist, the patient underwent placement of sequential compression devices and an oro-gastric tube.  A timeout was confirmed by the surgery and anesthesia teams.  The patient was adequately padded at all pressure points and placed on a footboard to prevent slippage from the OR table during extremes of position during surgery.  She underwent a routine sterile prep and drape of her entire abdomen.    Next, A transverse incision was made under the left subcostal area and a 57m optical viewing trocar was introduced into the peritoneal cavity. Pneumoperitoneum was applied with a high flow and low pressure. A laparoscope was  inserted to confirm placement. A extraperitoneal block was then placed at the lateral abdominal wall using exparel diluted with marcaine. 5 additional incisions were placed: 1 597mtrocar to the left of the midline. 1 additional 54m67mrocar in the left lateral area, 1 40m7mocar in the right mid abdomen, 1 54mm 58mcar in the right subcostal area, and a Nathanson retractor was placed through a subxiphoid incision.  Next, a hole was created through the lesser omentum along the greater curve of the stomach to enter the lesser sac. The vessels along the greater omentum were  Then ligated and divided using the Harmonic scalpel moving towards the spleen and then short gastric vessels were ligated and divided in the same fashion to fully mobilize the fundus. The left crus was identified to ensure completion of the dissection. Next the antrum was measured and dissection continued inferiorly along the greater curve towards the pylorus and stopped 6cm from the pylorus.   A 40Fr ViSiGi dilator was placed into the esophgaus and along the lesser curve of the stomach and placed on suction. 2 60mm 50m load echelon stapler(s) followed by 4 60mm b2mload echelon stapler(s) were used to make the resection along the antrum being sure to stay well away from the angularis by angling the jaws of the stapler towards the greater curve and later completing the resection staying along the ViSiGi [place] the fundus was not retained by appropriately retracting it lateral. Air was inserted through the ViSiGi [place] leak test showing no bubbles and a neutral lie of the stomach.  The assistant then went and performed an upper endoscopy and leak test. Clips were placed in 3 areas for bleeding along the staple line. No bubbles were seen and  the sleeve and antrum distended appropriately. The specimen was then placed in an endocatch bag and removed by the 36m port. The fascia of the 168mport was closed with a 0 vicryl by suture  passer. Hemostasis was ensured. Pneumoperitoneum was evacuated, all ports were removed and all incisions closed with 4-0 monocryl suture in subcuticular fashion. Steristrips and bandaids were put in place for dressing. The patient awoke from anesthesia and was brought to pacu in stable condition. All counts were correct.  Estimated blood loss: <3038mSpecimens:  Sleeve gastrectomy  Local Anesthesia: 50 ml Exparel:0.5% Marcaine mix  Post-Op Plan:       Pain Management: PO, prn      Antibiotics: Prophylactic      Anticoagulation: Prophylactic, Starting now      Post Op Studies/Consults: Not applicable      Intended Discharge: within 48h      Intended Outpatient Follow-Up: Two Week      Intended Outpatient Studies: Not Applicable      Other: Not Applicable  LukArta Brucensinger

## 2021-04-23 NOTE — Progress Notes (Signed)
Started water at 1450.

## 2021-04-23 NOTE — Discharge Instructions (Signed)
GASTRIC BYPASS / [place] Instructions  These instructions are to help you care for yourself when you go home.  Call: If you have any problems. Call [phone] and ask for the surgeon on call If you have an emergency related to your surgery please use the ER at [facility].  Tell the ER staff that you are a new post-op gastric bypass or gastric sleeve patient   Signs and symptoms to report: Severe vomiting or nausea If you cannot handle clear liquids for longer than 1 day, call your surgeon  Abdominal pain which does not get better after taking your pain medication Fever greater than 100.4 F and chills Heart rate over 100 beats a minute Trouble breathing Chest pain  Redness, swelling, drainage, or foul odor at incision (surgical) sites  If your incisions open or pull apart Swelling or pain in calf (lower leg) Diarrhea (Loose bowel movements that happen often), frequent watery, uncontrolled bowel movements Constipation, (no bowel movements for 3 days) if this happens:  Take Milk of Magnesia, 2 tablespoons by mouth, 3 times a day for 2 days if needed Stop taking Milk of Magnesia once you have had a bowel movement Call your doctor if constipation continues Or Take Miralax  (instead of Milk of Magnesia) following the label instructions Stop taking Miralax once you have had a bowel movement Call your doctor if constipation continues Anything you think is "abnormal for you"   Normal side effects after surgery: Unable to sleep at night or unable to concentrate Irritability Being tearful (crying) or depressed These are common complaints, possibly related to your anesthesia, stress of surgery and change in lifestyle, that usually go away a few weeks after surgery.  If these feelings continue, call your medical doctor.  Wound Care: You may have surgical glue, steri-strips, or staples over your incisions after surgery Surgical glue:  Looks like a clear film over your incisions  and will wear off a little at a time Steri-strips : Adhesive strips of tape over your incisions. You may notice a yellowish color on the skin under the steri-strips. This is used to make the   steri-strips stick better. Do not pull the steri-strips off - let them fall off Staples: Staples may be removed before you leave the hospital If you go home with staples, call [facility] at for an appointment with your surgeon's nurse to have staples removed 10 days after surgery, [phone] Showering: You may shower two (2) days after your surgery unless your surgeon tells you differently Wash gently around incisions with warm soapy water, rinse well, and gently pat dry  If you have a drain (tube from your incision), you may need someone to hold this while you shower  No tub baths until staples are removed and incisions are healed     Medications: Medications should be liquid or crushed if larger than the size of a dime Extended release pills (medication that releases a little bit at a time through the day) should not be crushed Depending on the size and number of medications you take, you may need to space (take a few throughout the day)/change the time you take your medications so that you do not over-fill your pouch (smaller stomach) Make sure you follow-up with your primary care physician to make medication changes needed during rapid weight loss and life-style changes If you have diabetes, follow up with the doctor that orders your diabetes medication(s) within one week after surgery and check  your blood sugar regularly. Do not drive while taking narcotics (pain medications) DO NOT take NSAID'S (Examples of NSAID's include ibuprofen, naproxen)  Diet:                    First 2 Weeks  You will see the nutritionist about two (2) weeks after your surgery. The nutritionist will increase the types of foods you can eat if you are handling liquids well: If you have severe vomiting or nausea  and cannot handle clear liquids lasting longer than 1 day, call your surgeon  Protein Shake Drink at least 2 ounces of shake 5-6 times per day Each serving of protein shakes (usually 8 - 12 ounces) should have a minimum of:  15 grams of protein  And no more than 5 grams of carbohydrate  Goal for protein each day: Men = 80 grams per day Women = 60 grams per day Protein powder may be added to fluids such as non-fat milk or Lactaid milk or Soy milk (limit to 35 grams added protein powder per serving)  Hydration Slowly increase the amount of water and other clear liquids as tolerated (See Acceptable Fluids) Slowly increase the amount of protein shake as tolerated   Sip fluids slowly and throughout the day May use sugar substitutes in small amounts (no more than 6 - 8 packets per day; i.e. Splenda)  Fluid Goal The first goal is to drink at least 8 ounces of protein shake/drink per day (or as directed by the nutritionist);  See handout from pre-op Bariatric Education Class for examples of protein shake/drink.   Slowly increase the amount of protein shake you drink as tolerated You may find it easier to slowly sip shakes throughout the day It is important to get your proteins in first Your fluid goal is to drink 64 - 100 ounces of fluid daily It may take a few weeks to build up to this 32 oz (or more) should be clear liquids  And  32 oz (or more) should be full liquids (see below for examples) Liquids should not contain sugar, caffeine, or carbonation  Clear Liquids: Water or Sugar-free flavored water (i.e. Fruit H2O, Propel) Decaffeinated coffee or tea (sugar-free) Sharon Merritt Lite, Wyler's Lite, Minute Maid Lite Sugar-free Jell-O Bouillon or broth Sugar-free Popsicle:   *Less than 20 calories each; Limit 1 per day  Full Liquids: Protein Shakes/Drinks + 2 choices per day of other full liquids Full liquids must be: No More Than 12 grams of Carbs per serving  No More Than 3 grams of Fat  per serving Strained low-fat cream soup Non-Fat milk Fat-free Lactaid Milk Sugar-free yogurt (Dannon Lite & Fit, Greek yogurt)      Vitamins and Minerals Start 1 day after surgery unless otherwise directed by your surgeon Bariatric Specific Complete Multivitamins Chewable Calcium Citrate with Vitamin D-3 (Example: 3 Chewable Calcium Plus 600 with Vitamin D-3) Take 500 mg three (3) times a day for a total of 1500 mg each day Do not take all 3 doses of calcium at one time as it may cause constipation, and you can only absorb 500 mg  at a time  Do not mix multivitamins containing iron with calcium supplements; take 2 hours apart  Menstruating women and those at risk for anemia (a blood disease that causes weakness) may need extra iron Talk with your doctor to see if you need more iron If you need extra iron: Total daily Iron recommendation (including Vitamins) is 50 to 100  mg Iron/day Do not stop taking or change any vitamins or minerals until you talk to your nutritionist or surgeon Your nutritionist and/or surgeon must approve all vitamin and mineral supplements   Activity and Exercise: It is important to continue walking at home.  Limit your physical activity as instructed by your doctor.  During this time, use these guidelines: Do not lift anything greater than ten (10) pounds for at least two (2) weeks Do not go back to work or drive until Engineer, production says you can You may have sex when you feel comfortable  It is VERY important for female patients to use a reliable birth control method; fertility often increases after surgery  Do not get pregnant for at least 18 months Start exercising as soon as your doctor tells you that you can Make sure your doctor approves any physical activity Start with a simple walking program Walk 5-15 minutes each day, 7 days per week.  Slowly increase until you are walking 30-45 minutes per day Consider joining our [place] program. [phone] or email  [email or web address]   Special Instructions Things to remember:  Use your CPAP when sleeping if this applies to you, do not stop the use of CPAP unless directed by physician after a sleep study [facility] has a free Bariatric Surgery Support Group that meets monthly, the 3rd Thursday, 6 pm.  Please review discharge information for date and location of this meeting. It is very important to keep all follow up appointments with your surgeon, nutritionist, primary care physician, and behavioral health practitioner After the first year, please follow up with your bariatric surgeon and nutritionist at least once a year in order to maintain best weight loss results   [facility]: [place]: [phone] Bariatric Nurse Coordinator: [phone]

## 2021-04-23 NOTE — H&P (Signed)
Chief Complaint: Pre-op Exam  History of Present Illness: Sharon Merritt is a 45 y.o. female who is seen today for sleeve gastrectomy.  She has completed all requirements. She has no new medical problems. She has made multiple improvements to diet recently and lost 3 lb.  Review of Systems: A complete review of systems was obtained from the patient. I have reviewed this information and discussed as appropriate with the patient. See HPI as well for other ROS.  ROS   Medical History: Past Medical History:  Diagnosis Date   Anxiety   Arthritis   Asthma, unspecified asthma severity, unspecified whether complicated, unspecified whether persistent   GERD (gastroesophageal reflux disease)   There is no problem list on file for this patient.  Past Surgical History:  Procedure Laterality Date   LAPAROSCOPIC TUBAL LIGATION 11/16/1998    No Known Allergies  Current Outpatient Medications on File Prior to Visit  Medication Sig Dispense Refill   albuterol 90 mcg/actuation inhaler   cetirizine (ZYRTEC) 10 MG tablet   FLUoxetine (PROZAC) 40 MG capsule   LORazepam (ATIVAN) 0.5 MG tablet Take 1 tablet by mouth 2 (two) times daily as needed   lurasidone (LATUDA) 40 mg tablet   fluticasone propion-salmeteroL (ADVAIR HFA) 115-21 mcg/actuation inhaler Inhale into the lungs   ibuprofen (MOTRIN) 200 MG tablet Take by mouth   valACYclovir (VALTREX) 1000 MG tablet valacyclovir 1 gram tablet   No current facility-administered medications on file prior to visit.   Family History  Problem Relation Age of Onset   Stroke Father   High blood pressure (Hypertension) Father   Hyperlipidemia (Elevated cholesterol) Father   Diabetes Father   Obesity Sister   Coronary Artery Disease (Blocked arteries around heart) Sister   Diabetes Sister    Social History   Tobacco Use  Smoking Status Former Smoker   Quit date: 2010   Years since quitting: 12.7  Smokeless Tobacco Never Used    Social  History   Socioeconomic History   Marital status: Married  Tobacco Use   Smoking status: Former Smoker  Quit date: 2010  Years since quitting: 12.7   Smokeless tobacco: Never Used  Substance and Sexual Activity   Alcohol use: Never   Drug use: Never   Objective:   Vitals:  04/10/21 0955  BP: 126/86  Pulse: (!) 113  Temp: 36.9 C (98.4 F)  SpO2: 96%  Weight: (!) 113.3 kg (249 lb 12.8 oz)  Height: 165.1 cm (5' 5" )   Body mass index is 41.57 kg/m.  Physical Exam Constitutional:  Appearance: Normal appearance.  HENT:  Head: Normocephalic and atraumatic.  Pulmonary:  Effort: Pulmonary effort is normal.  Musculoskeletal:  General: Normal range of motion.  Cervical back: Normal range of motion.  Neurological:  General: No focal deficit present.  Mental Status: She is alert and oriented to person, place, and time. Mental status is at baseline.  Psychiatric:  Mood and Affect: Mood normal.  Behavior: Behavior normal.  Thought Content: Thought content normal.     Assessment and Plan:  Diagnoses and all orders for this visit:  Morbid (severe) obesity due to excess calories (CMS-HCC)  Gastroesophageal reflux disease without esophagitis  Hiatal hernia   The patient meets weight loss surgery criteria. Due to the above reasons, I think laparoscopic vertical sleeve gastrectomy is the best option for the patient.   We discussed LSG. We discussed the preoperative, operative and postoperative process. I explained the surgery in detail including the performance of an  EGD near the end of the surgery to test for leak. We discussed the typical hospital course including a 1-2 day stay baring any complications. The patient was given educational material. I quoted the patient that most patients can lose up to 50-70% of their excess weight. We did discuss the possibility of weight regain several years after the procedure.   The risks of infection, bleeding, pain, scarring, weight  regain, too little or too much weight loss, vitamin deficiencies and need for lifelong vitamin supplementation, hair loss, need for protein supplementation, leaks, stricture, reflux, food intolerance, gallstone formation, hernia, need for reoperation, need for open surgery, injury to spleen or surrounding structures, DVT's, PE, and death again discussed with the patient and the patient expressed understanding and desires to proceed with laparoscopic sleeve gastrectomy, possible open, intraoperative endoscopy.  We discussed that before and after surgery that there would be an alteration in their diet. I explained that we may put them on a diet 2 weeks before surgery. I also explained that they would be on a liquid diet for 2 weeks after surgery. We discussed that they would have to avoid certain foods after surgery. We discussed the importance of physical activity as well as compliance with our dietary and supplement recommendations and routine follow-up.

## 2021-04-23 NOTE — Progress Notes (Signed)
PHARMACY CONSULT FOR:  Risk Assessment for Post-Discharge VTE Following Bariatric Surgery  Post-Discharge VTE Risk Assessment: This patient's probability of 30-day post-discharge VTE is increased due to the factors marked:   Female    Age >/=60 years    BMI >/=50 kg/m2    CHF    Dyspnea at Rest    Paraplegia   X Non-gastric-band surgery    Operation Time >/=3 hr    Return to OR     Length of Stay >/= 3 d   Hx of VTE   Hypercoagulable condition   Significant venous stasis       Predicted probability of 30-day post-discharge VTE: 0.16%  Other patient-specific factors to consider:  Recommendation for Discharge: No pharmacologic prophylaxis post-discharge  Sharon Merritt is a 45 y.o. female who underwent laparoscopic sleeve gastrectomy on 04/23/21   Case start: 1116 Case end: 1207   No Known Allergies  Patient Measurements: Height: 5' 6"  (167.6 cm) Weight: 112.4 kg (247 lb 12.8 oz) IBW/kg (Calculated) : 59.3 Body mass index is 40 kg/m.  No results for input(s): WBC, HGB, HCT, PLT, APTT, CREATININE, LABCREA, CREATININE, CREAT24HRUR, MG, PHOS, ALBUMIN, PROT, ALBUMIN, AST, ALT, ALKPHOS, BILITOT, BILIDIR, IBILI in the last 72 hours. Estimated Creatinine Clearance: 90.2 mL/min (by C-G formula based on SCr of 0.98 mg/dL).    Past Medical History:  Diagnosis Date   Anxiety    Asthma    Bipolar 1 disorder ([place])    Breast discharge    Depression    Epstein Barr infection    Essential hypertension    Fibromyalgia    Lyme disease    Pneumonia    [place] spotted fever      Medications Prior to Admission  Medication Sig Dispense Refill Last Dose   albuterol (VENTOLIN HFA) 108 (90 Base) MCG/ACT inhaler Inhale 2 puffs into the lungs daily as needed (Asthma).   Past Week   FLUoxetine (PROZAC) 40 MG capsule Take 1 capsule (40 mg total) by mouth daily. 90 capsule 1 04/22/2021   fluticasone-salmeterol (ADVAIR HFA) 115-21 MCG/ACT inhaler Inhale 2 puffs into the lungs  2 (two) times daily.   Past Week   ibuprofen (ADVIL) 200 MG tablet Take 600 mg by mouth daily.   04/22/2021   LORazepam (ATIVAN) 0.5 MG tablet TAKE 1 TABLET BY MOUTH 2 TIMES DAILY AS NEEDED FOR ANXIETY. 45 tablet 1 Past Month   lurasidone (LATUDA) 20 MG TABS tablet Take 1 tablet (20 mg total) by mouth daily. 90 tablet 1 04/22/2021   nitroGLYCERIN (NITROSTAT) 0.4 MG SL tablet Place 1 tablet (0.4 mg total) under the tongue every 5 (five) minutes as needed for chest pain. 10 tablet 1 never   valACYclovir (VALTREX) 1000 MG tablet Take 1,000 mg by mouth daily as needed (Epstein-Barr).   More than a month       Ulice Dash D 04/23/2021,1:07 PM

## 2021-04-23 NOTE — Anesthesia Preprocedure Evaluation (Addendum)
Anesthesia Evaluation  Patient identified by MRN, date of birth, ID band Patient awake    Reviewed: Allergy & Precautions, NPO status , Patient's Chart, lab work & pertinent test results  Airway Mallampati: III  TM Distance: >3 FB Neck ROM: Full    Dental no notable dental hx.    Pulmonary asthma (did not use inhaler this AM; typically uses it 1-2 times a day) , former smoker,    Pulmonary exam normal breath sounds clear to auscultation       Cardiovascular Exercise Tolerance: Good hypertension, Normal cardiovascular exam Rhythm:Regular Rate:Normal  Normal sinus rhythm Septal infarct , age undetermined Abnormal ECG Confirmed by Asencion Noble [phone]) on 12/07/2020 7:06:58 PM   Neuro/Psych PSYCHIATRIC DISORDERS Anxiety Depression Bipolar Disorder    GI/Hepatic Neg liver ROS, GERD  ,  Endo/Other  negative endocrine ROS  Renal/GU   negative genitourinary   Musculoskeletal  (+) Arthritis , Fibromyalgia -  Abdominal   Peds  Hematology negative hematology ROS (+)   Anesthesia Other Findings   Reproductive/Obstetrics negative OB ROS                            Anesthesia Physical Anesthesia Plan  ASA: 3  Anesthesia Plan: General   Post-op Pain Management:    Induction: Intravenous  PONV Risk Score and Plan: 3 and Treatment may vary due to age or medical condition, Scopolamine patch - Pre-op, Midazolam, Dexamethasone and Ondansetron  Airway Management Planned: Oral ETT  Additional Equipment: None  Intra-op Plan:   Post-operative Plan: Extubation in OR  Informed Consent: I have reviewed the patients History and Physical, chart, labs and discussed the procedure including the risks, benefits and alternatives for the proposed anesthesia with the patient or authorized representative who has indicated his/her understanding and acceptance.     Dental advisory given  Plan Discussed with: CRNA,  Anesthesiologist and Surgeon  Anesthesia Plan Comments:         Anesthesia Quick Evaluation

## 2021-04-23 NOTE — Anesthesia Procedure Notes (Signed)
Procedure Name: Intubation Date/Time: 04/23/2021 11:00 AM Performed by: Claudia Desanctis, CRNA Pre-anesthesia Checklist: Patient identified, Emergency Drugs available, Suction available and Patient being monitored Patient Re-evaluated:Patient Re-evaluated prior to induction Oxygen Delivery Method: Circle system utilized Preoxygenation: Pre-oxygenation with 100% oxygen Induction Type: IV induction Ventilation: Mask ventilation without difficulty Laryngoscope Size: 2 and Miller Grade View: Grade I Tube type: Oral Tube size: 7.0 mm Number of attempts: 1 Airway Equipment and Method: Stylet Placement Confirmation: ETT inserted through vocal cords under direct vision, positive ETCO2 and breath sounds checked- equal and bilateral Secured at: 21 cm Tube secured with: Tape Dental Injury: Teeth and Oropharynx as per pre-operative assessment

## 2021-04-24 ENCOUNTER — Other Ambulatory Visit (HOSPITAL_COMMUNITY): Payer: Self-pay

## 2021-04-24 ENCOUNTER — Encounter (HOSPITAL_COMMUNITY): Payer: Self-pay | Admitting: General Surgery

## 2021-04-24 LAB — CBC WITH DIFFERENTIAL/PLATELET
Abs Immature Granulocytes: 0.04 10*3/uL (ref 0.00–0.07)
Basophils Absolute: 0 10*3/uL (ref 0.0–0.1)
Basophils Relative: 0 %
Eosinophils Absolute: 0 10*3/uL (ref 0.0–0.5)
Eosinophils Relative: 0 %
HCT: 39 % (ref 36.0–46.0)
Hemoglobin: 12.5 g/dL (ref 12.0–15.0)
Immature Granulocytes: 0 %
Lymphocytes Relative: 8 %
Lymphs Abs: 0.8 10*3/uL (ref 0.7–4.0)
MCH: 29.8 pg (ref 26.0–34.0)
MCHC: 32.1 g/dL (ref 30.0–36.0)
MCV: 92.9 fL (ref 80.0–100.0)
Monocytes Absolute: 0.9 10*3/uL (ref 0.1–1.0)
Monocytes Relative: 9 %
Neutro Abs: 8.7 10*3/uL — ABNORMAL HIGH (ref 1.7–7.7)
Neutrophils Relative %: 83 %
Platelets: 264 10*3/uL (ref 150–400)
RBC: 4.2 MIL/uL (ref 3.87–5.11)
RDW: 14.6 % (ref 11.5–15.5)
WBC: 10.5 10*3/uL (ref 4.0–10.5)
nRBC: 0 % (ref 0.0–0.2)

## 2021-04-24 LAB — COMPREHENSIVE METABOLIC PANEL
ALT: 26 U/L (ref 0–44)
AST: 27 U/L (ref 15–41)
Albumin: 3.7 g/dL (ref 3.5–5.0)
Alkaline Phosphatase: 57 U/L (ref 38–126)
Anion gap: 6 (ref 5–15)
BUN: 9 mg/dL (ref 6–20)
CO2: 24 mmol/L (ref 22–32)
Calcium: 8.5 mg/dL — ABNORMAL LOW (ref 8.9–10.3)
Chloride: 102 mmol/L (ref 98–111)
Creatinine, Ser: 0.93 mg/dL (ref 0.44–1.00)
GFR, Estimated: >60 mL/min (ref >60)
Glucose, Bld: 148 mg/dL — ABNORMAL HIGH (ref 70–99)
Potassium: 4.2 mmol/L (ref 3.5–5.1)
Sodium: 132 mmol/L — ABNORMAL LOW (ref 135–145)
Total Bilirubin: 0.3 mg/dL (ref 0.3–1.2)
Total Protein: 7.2 g/dL (ref 6.5–8.1)

## 2021-04-24 MED ORDER — PANTOPRAZOLE SODIUM 40 MG PO TBEC: 40.0000 mg | DELAYED_RELEASE_TABLET | Freq: Every day | ORAL | 0 refills | Status: DC

## 2021-04-24 MED ORDER — ONDANSETRON 4 MG PO TBDP
4.0000 mg | ORAL_TABLET | Freq: Four times a day (QID) | ORAL | 0 refills | Status: DC | PRN
  Filled 2021-04-24: qty 20, 5d supply, fill #0

## 2021-04-24 MED ORDER — PANTOPRAZOLE SODIUM 40 MG PO TBEC
40.0000 mg | DELAYED_RELEASE_TABLET | Freq: Every day | ORAL | 0 refills | Status: AC
  Filled 2021-04-24: qty 90, 90d supply, fill #0

## 2021-04-24 MED ORDER — ONDANSETRON 4 MG PO TBDP: 4.0000 mg | ORAL_TABLET | Freq: Four times a day (QID) | ORAL | 0 refills | Status: DC | PRN

## 2021-04-24 MED ORDER — ACETAMINOPHEN 500 MG PO TABS: 1000.0000 mg | ORAL_TABLET | Freq: Three times a day (TID) | ORAL | 0 refills | Status: AC

## 2021-04-24 NOTE — Progress Notes (Signed)
Patient alert and oriented, pain is controlled. Patient is tolerating fluids, advanced to protein shake today, patient is tolerating well.  Reviewed Gastric sleeve discharge instructions with patient and patient is able to articulate understanding.  Provided information on BELT program, Support Group and [facility] outpatient pharmacy. All questions answered, will continue to monitor.

## 2021-04-24 NOTE — Progress Notes (Signed)
D/C instructions provided by Donella Stade, RN. IV removed and pt d/cd to home.

## 2021-04-24 NOTE — Anesthesia Postprocedure Evaluation (Signed)
Anesthesia Post Note  Patient: Sharon Merritt  Procedure(s) Performed: LAPAROSCOPIC GASTRIC SLEEVE RESECTION (Abdomen) UPPER GI ENDOSCOPY (Mouth)     Patient location during evaluation: PACU Anesthesia Type: General Level of consciousness: sedated and patient cooperative Pain management: pain level controlled Vital Signs Assessment: post-procedure vital signs reviewed and stable Respiratory status: spontaneous breathing Cardiovascular status: stable Anesthetic complications: no   No notable events documented.  Last Vitals:  Vitals:   04/24/21 0932 04/24/21 1359  BP: 134/80 (!) 165/102  Pulse: 83 83  Resp: 16 14  Temp: 36.9 C 36.7 C  SpO2: 95% 95%    Last Pain:  Vitals:   04/24/21 1359  TempSrc: Oral  PainSc:                  Nolon Nations

## 2021-04-24 NOTE — Progress Notes (Signed)
24hr fluid recall prior to discharge: 950m.  Per dehydration protocol, will call pt to f/u within one week post op.

## 2021-04-24 NOTE — Discharge Summary (Signed)
Physician Discharge Summary  Sharon Merritt [id number] DOB: 1975/09/28 DOA: 04/23/2021  PCP: Celene Squibb, MD  Admit date: 04/23/2021 Discharge date: 04/24/2021  Recommendations for Outpatient Follow-up:   (include homehealth, outpatient follow-up instructions, specific recommendations for PCP to follow-up on, etc.)   Follow-up Information     [name], [name], MD. Go on 05/17/2021.   Specialty: General Surgery Why: at 9:10am.  Please arrive 15 minutes prior to your appointment time.  Thank you. Contact information: [place] [address] [phone]         Surgery, [place]. Go on 06/12/2021.   Specialty: General Surgery Why: at 9am with Dr. Kieth Brightly.  Please arrive 15 minutes prior to your appointment time.  Thank you. Contact information: [place] [address] [phone]                Discharge Diagnoses:  Active Problems:   Morbid obesity (Sharon Merritt)   Surgical Procedure: laparoscopic sleeve gastrectomy, upper endoscopy  Discharge Condition: Good Disposition: Home  Diet recommendation: Postoperative sleeve gastrectomy diet (liquids only)  Filed Weights   04/23/21 0847  Weight: 112.4 kg     Hospital Course:  The patient was admitted after undergoing laparoscopic sleeve gastrectomy. POD 0 she ambulated well. POD 1 she was started on the water diet protocol and tolerated 600 ml in the first shift. Once meeting the water amount she was advanced to bariatric protein shakes which they tolerated and were discharged home POD 1.  Treatments: surgery: laparoscopic sleeve gastrectomy  Discharge Instructions  Discharge Instructions     Ambulate hourly while awake   Complete by: As directed    Call MD for:  difficulty breathing, headache or visual disturbances   Complete by: As directed    Call MD for:  persistant dizziness or light-headedness   Complete by: As directed    Call MD for:   persistant nausea and vomiting   Complete by: As directed    Call MD for:  redness, tenderness, or signs of infection (pain, swelling, redness, odor or green/yellow discharge around incision site)   Complete by: As directed    Call MD for:  severe uncontrolled pain   Complete by: As directed    Call MD for:  temperature >101 F   Complete by: As directed    Diet bariatric full liquid   Complete by: As directed    Discharge wound care:   Complete by: As directed    Remove Bandaids tomorrow, ok to shower tomorrow. Steristrips may fall off in 1-3 weeks.   Incentive spirometry   Complete by: As directed    Perform hourly while awake      Allergies as of 04/24/2021   No Known Allergies      Medication List     STOP taking these medications    ibuprofen 200 MG tablet Commonly known as: ADVIL       TAKE these medications    acetaminophen 500 MG tablet Commonly known as: TYLENOL Take 2 tablets (1,000 mg total) by mouth every 8 (eight) hours for 5 days.   albuterol 108 (90 Base) MCG/ACT inhaler Commonly known as: VENTOLIN HFA Inhale 2 puffs into the lungs daily as needed (Asthma).   FLUoxetine 40 MG capsule Commonly known as: PROZAC Take 1 capsule (40 mg total) by mouth daily.   fluticasone-salmeterol 115-21 MCG/ACT inhaler Commonly known as: ADVAIR HFA Inhale 2 puffs into the lungs 2 (two) times daily.  LORazepam 0.5 MG tablet Commonly known as: ATIVAN TAKE 1 TABLET BY MOUTH 2 TIMES DAILY AS NEEDED FOR ANXIETY.   lurasidone 20 MG Tabs tablet Commonly known as: LATUDA Take 1 tablet (20 mg total) by mouth daily.   nitroGLYCERIN 0.4 MG SL tablet Commonly known as: NITROSTAT Place 1 tablet (0.4 mg total) under the tongue every 5 (five) minutes as needed for chest pain.   ondansetron 4 MG disintegrating tablet Commonly known as: ZOFRAN-ODT Take 1 tablet (4 mg total) by mouth every 6 (six) hours as needed for nausea or vomiting.   pantoprazole 40 MG  tablet Commonly known as: PROTONIX Take 1 tablet (40 mg total) by mouth daily.   valACYclovir 1000 MG tablet Commonly known as: VALTREX Take 1,000 mg by mouth daily as needed (Epstein-Barr).               Discharge Care Instructions  (From admission, onward)           Start     Ordered   04/24/21 0000  Discharge wound care:       Comments: Remove Bandaids tomorrow, ok to shower tomorrow. Steristrips may fall off in 1-3 weeks.   04/24/21 0758            Follow-up Information     Moussa Wiegand, [name], MD. Go on 05/17/2021.   Specialty: General Surgery Why: at 9:10am.  Please arrive 15 minutes prior to your appointment time.  Thank you. Contact information: [place] [address] [phone]         Surgery, [place]. Go on 06/12/2021.   Specialty: General Surgery Why: at 9am with Dr. Kieth Brightly.  Please arrive 15 minutes prior to your appointment time.  Thank you. Contact information: [place] [address] [phone]                  The results of significant diagnostics from this hospitalization (including imaging, microbiology, ancillary and laboratory) are listed below for reference.    Significant Diagnostic Studies: No results found.  Labs: Basic Metabolic Panel: Recent Labs  Lab 04/24/21 0427  NA 132*  K 4.2  CL 102  CO2 24  GLUCOSE 148*  BUN 9  CREATININE 0.93  CALCIUM 8.5*   Liver Function Tests: Recent Labs  Lab 04/24/21 0427  AST 27  ALT 26  ALKPHOS 57  BILITOT 0.3  PROT 7.2  ALBUMIN 3.7    CBC: Recent Labs  Lab 04/23/21 1343 04/24/21 0427  WBC  --  10.5  NEUTROABS  --  8.7*  HGB 12.8 12.5  HCT 39.7 39.0  MCV  --  92.9  PLT  --  264    CBG: No results for input(s): GLUCAP in the last 168 hours.  Active Problems:   Morbid obesity ([name])   VTE plan: no chemical prophylaxis  recommended ([email or web address])  Time coordinating discharge: 15 min

## 2021-04-24 NOTE — Progress Notes (Signed)
   04/24/21 1300  Mobility  Activity Ambulated in hall  Level of Assistance Independent after set-up  Assistive Device Other (Comment) (IV pole)  Distance Ambulated (ft) 900 ft  Mobility Ambulated with assistance in hallway  Mobility Response Tolerated well  Mobility performed by Mobility specialist  $Mobility charge 1 Mobility   Pt agreeable to mobilize this morning. Ambulated about 924f in hall while pushing IV pole, Tolerated well. Took 1 standing rest break secondary to pt having some "gas pain". Otherwise no complaints. Pt left in chair with call bell at side.    [place]ist [facility] Office: [phone]

## 2021-04-25 ENCOUNTER — Other Ambulatory Visit (HOSPITAL_COMMUNITY): Payer: Self-pay

## 2021-04-25 LAB — SURGICAL PATHOLOGY

## 2021-04-26 ENCOUNTER — Telehealth (HOSPITAL_COMMUNITY): Payer: Self-pay | Admitting: *Deleted

## 2021-04-29 NOTE — Telephone Encounter (Signed)
1.  Tell me about your pain and pain management? Pt denies any pain.  2.  Let's talk about fluid intake.  How much total fluid are you taking in? Pt states that she is getting in at least 64oz of fluid including protein shakes, unsweet tea, broth, protein water, yogurt and cream soups.  3.  How much protein have you taken in the last 2 days? Pt states she is meeting her goal of 60g of protein each day with the protein shakes and protein water.  4.  Have you had nausea?  Tell me about when have experienced nausea and what you did to help? Pt denies nausea.   5.  Has the frequency or color changed with your urine? Pt states that she is urinating "fine" with no changes in frequency or urgency.     6.  Tell me what your incisions look like? "Incisions look fine". Pt denies a fever, chills.  Pt states incisions are not swollen, open, or draining.  Pt encouraged to call [facility] if incisions change.   7.  Have you been passing gas? BM? Pt states that she is having BMs. Last BM 04/28/21.     8.  If a problem or question were to arise who would you call?  Do you know contact numbers for [facility], [facility], and [facility]? Pt denies dehydration symptoms.  Pt can describe s/sx of dehydration.  Pt knows to call [facility] for surgical, [facility] for nutrition, and [facility] for non-urgent questions or concerns.   9.  How has the walking going? Pt states she is walking around and able to be active without difficulty.   10. Are you still using your incentive spirometer?  If so, how often? Pt states that she has not been "using it as much since I'm walking around a lot". Pt encouraged to use incentive spirometer, at least 10x every hour while awake until she sees the surgeon.  11.  How are your vitamins and calcium going?  How are you taking them? Pt states that she is taking her supplements and vitamins without difficulty.  Pt reminded to take calcium 3x/day at least two hours apart.  Reminded patient that the first 30 days  post-operatively are important for successful recovery.  Practice good hand hygiene, wearing a mask when appropriate (since optional in most places), and minimizing exposure to people who live outside of the home, especially if they are exhibiting any respiratory, GI, or illness-like symptoms.

## 2021-05-07 ENCOUNTER — Encounter: Attending: General Surgery | Admitting: Skilled Nursing Facility1

## 2021-05-07 ENCOUNTER — Other Ambulatory Visit: Payer: Self-pay

## 2021-05-07 DIAGNOSIS — Z6837 Body mass index (BMI) 37.0-37.9, adult: Secondary | ICD-10-CM | POA: Insufficient documentation

## 2021-05-08 NOTE — Progress Notes (Signed)
2 Week Post-Operative Nutrition Class   Patient was seen on 05/07/21 for Post-Operative Nutrition education at the [facility].     Surgery date: 04/23/2021 Surgery type: sleeve Start weight at [facility]: 253 Weight today: 232.8 pounds Bowel Habits: Every day to every other day no complaints   Body Composition Scale Date  Current Body Weight 232.8  Total Body Fat % 42.4  Visceral Fat 13  Fat-Free Mass % 57.5   Total Body Water % 43.2  Muscle-Mass lbs 32.8  BMI 37.5  Body Fat Displacement          Torso  lbs 61.3         Left Leg  lbs 12.2         Right Leg  lbs 12.2         Left Arm  lbs 6.1         Right Arm   lbs 6.1      The following the learning objectives were met by the patient during this course: Identifies Phase 3 (Soft, High Proteins) Dietary Goals and will begin from 2 weeks post-operatively to 2 months post-operatively Identifies appropriate sources of fluids and proteins  Identifies appropriate fat sources and healthy verses unhealthy fat types   States protein recommendations and appropriate sources post-operatively Identifies the need for appropriate texture modifications, mastication, and bite sizes when consuming solids Identifies appropriate fat consumption and sources Identifies appropriate multivitamin and calcium sources post-operatively Describes the need for physical activity post-operatively and will follow MD recommendations States when to call healthcare provider regarding medication questions or post-operative complications   Handouts given during class include: Phase 3A: Soft, High Protein Diet Handout Phase 3 High Protein Meals Healthy Fats   Follow-Up Plan: Patient will follow-up at [facility] in 6 weeks for 2 month post-op nutrition visit for diet advancement per MD.

## 2021-05-13 ENCOUNTER — Telehealth: Payer: Self-pay | Admitting: Skilled Nursing Facility1

## 2021-05-13 NOTE — Telephone Encounter (Signed)
RD called pt to verify fluid intake once starting soft, solid proteins 2 week post-bariatric surgery.   Daily Fluid intake: 64 oz Daily Protein intake: 60 g Bowel Habits: every day to every other day  Concerns/issues:   None stated
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
## 2021-05-15 ENCOUNTER — Telehealth (INDEPENDENT_AMBULATORY_CARE_PROVIDER_SITE_OTHER): Payer: Self-pay | Admitting: Psychiatry

## 2021-05-15 ENCOUNTER — Other Ambulatory Visit: Payer: Self-pay

## 2021-05-15 ENCOUNTER — Encounter: Payer: Self-pay | Admitting: Psychiatry

## 2021-05-15 DIAGNOSIS — F3175 Bipolar disorder, in partial remission, most recent episode depressed: Secondary | ICD-10-CM

## 2021-05-15 DIAGNOSIS — F411 Generalized anxiety disorder: Secondary | ICD-10-CM

## 2021-05-15 MED ORDER — LURASIDONE HCL 20 MG PO TABS: 20.0000 mg | ORAL_TABLET | Freq: Every day | ORAL | 0 refills | Status: DC

## 2021-05-15 NOTE — Patient Instructions (Signed)
1. Continue fluoxetine 40 mg daily  2. Continue Latuda 20 mg daily 3. Next appointment: 1/11 at 9:30

## 2021-05-25 ENCOUNTER — Other Ambulatory Visit: Payer: Self-pay | Admitting: Psychiatry

## 2021-05-25 DIAGNOSIS — F3175 Bipolar disorder, in partial remission, most recent episode depressed: Secondary | ICD-10-CM

## 2021-05-31 ENCOUNTER — Other Ambulatory Visit (HOSPITAL_COMMUNITY): Payer: Self-pay

## 2021-05-31 ENCOUNTER — Other Ambulatory Visit (HOSPITAL_BASED_OUTPATIENT_CLINIC_OR_DEPARTMENT_OTHER): Payer: Self-pay

## 2021-06-12 ENCOUNTER — Encounter: Attending: General Surgery | Admitting: Skilled Nursing Facility1

## 2021-06-12 ENCOUNTER — Other Ambulatory Visit: Payer: Self-pay

## 2021-06-12 DIAGNOSIS — Z6837 Body mass index (BMI) 37.0-37.9, adult: Secondary | ICD-10-CM | POA: Insufficient documentation

## 2021-06-12 NOTE — Progress Notes (Signed)
Bariatric Nutrition Follow-Up Visit Medical Nutrition Therapy    NUTRITION ASSESSMENT   Surgery date: 04/23/2021 Surgery type: sleeve Start weight at [facility]: 253 Weight today: pt declined   Body Composition Scale Date  Current Body Weight 232.8  Total Body Fat % 42.4  Visceral Fat 13  Fat-Free Mass % 57.5   Total Body Water % 43.2  Muscle-Mass lbs 32.8  BMI 37.5  Body Fat Displacement          Torso  lbs 61.3         Left Leg  lbs 12.2         Right Leg  lbs 12.2         Left Arm  lbs 6.1         Right Arm   lbs 6.1   Clinical  Medical hx: Bipolar, Epstein barr infection,  lyme disease, rocky mountain spotted fever, HTN, fibromyalgia  Medications: Advair, albuterol, motrin, Latuda, Prozac, lorazepam   Labs:  Notable signs/symptoms: hip and lower back pain Any previous deficiencies? No   Lifestyle & Dietary Hx   Pt states kilbasa and buffalo chicken made her sick. Pt state she does have heart burn if she does not take antacid every couple days. Pt state she eats until hiccups.   Estimated daily fluid intake: not really sure but thinking over 64 ounces oz Estimated daily protein intake: 70+ g Supplements: multi and calcium  Current average weekly physical activity: yoga 2 days a week    24-Hr Dietary Recall First Meal: protein shake Snack: cheese  Second Meal: beans + shrimp + cheese Snack: yogurt  Third Meal: chicken or [place] or shrimp  Snack:  Beverages: gatorade zero, water, unsweet tea  Post-Op Goals/ Signs/ Symptoms Using straws: no Drinking while eating: no Chewing/swallowing difficulties: no Changes in vision: no Changes to mood/headaches: no Hair loss/changes to skin/nails: no Difficulty focusing/concentrating: no Sweating: no Limb weakness: no Dizziness/lightheadedness: no Palpitations: no  Carbonated/caffeinated beverages: no N/V/D/C/Gas: no Abdominal pain: no Dumping syndrome: no    NUTRITION DIAGNOSIS  Overweight/obesity ([place]-3.3) related  to past poor dietary habits and physical inactivity as evidenced by completed bariatric surgery and following dietary guidelines for continued weight loss and healthy nutrition status.     NUTRITION INTERVENTION Nutrition counseling (C-1) and education (E-2) to facilitate bariatric surgery goals, including: Diet advancement to the next phase (phase 4) now including non starchy vegetable The importance of consuming adequate calories as well as certain nutrients daily due to the body's need for essential vitamins, minerals, and fats The importance of daily physical activity and to reach a goal of at least 150 minutes of moderate to vigorous physical activity weekly (or as directed by their physician) due to benefits such as increased musculature and improved lab values The importance of intuitive eating specifically learning hunger-satiety cues and understanding the importance of learning a new body: The importance of mindful eating to avoid grazing behaviors  Goals: -cut back by half a bite to help with eating a little bit past satisfied -Continue to aim for a minimum of 64 fluid ounces 7 days a week with at least 30 ounces being plain water  -Eat non-starchy vegetables 2 times a day 7 days a week  -Start out with soft cooked vegetables today and tomorrow; if tolerated begin to eat raw vegetables or cooked including salads  -Eat your 3 ounces of protein first then start in on your non-starchy vegetables; once you understand how much of your meal leads  to satisfaction and not full while still eating 3 ounces of protein and non-starchy vegetables you can eat them in any order   -Continue to aim for 30 minutes of activity at least 5 times a week  -Do NOT cook with/add to your food: alfredo sauce, cheese sauce, barbeque sauce, ketchup, fat back, butter, bacon grease, grease, Crisco, OR SUGAR   Handouts Provided Include  Phase 4  Learning Style & Readiness for Change Teaching method utilized:  Visual & Auditory  Demonstrated degree of understanding via: Teach Back  Readiness Level: action Barriers to learning/adherence to lifestyle change: non identified   RD's Notes for Next Visit Assess adherence to pt chosen goals    MONITORING & EVALUATION Dietary intake, weekly physical activity, body weight  Next Steps Patient is to follow-up in 3-4 months
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
## 2021-07-10 ENCOUNTER — Telehealth (INDEPENDENT_AMBULATORY_CARE_PROVIDER_SITE_OTHER): Payer: Self-pay | Admitting: Psychiatry

## 2021-07-10 ENCOUNTER — Other Ambulatory Visit: Payer: Self-pay

## 2021-07-10 ENCOUNTER — Encounter: Payer: Self-pay | Admitting: Psychiatry

## 2021-07-10 DIAGNOSIS — F3175 Bipolar disorder, in partial remission, most recent episode depressed: Secondary | ICD-10-CM

## 2021-07-10 DIAGNOSIS — F411 Generalized anxiety disorder: Secondary | ICD-10-CM

## 2021-07-10 DIAGNOSIS — G2401 Drug induced subacute dyskinesia: Secondary | ICD-10-CM

## 2021-07-10 MED ORDER — LURASIDONE HCL 20 MG PO TABS: 20.0000 mg | ORAL_TABLET | Freq: Every day | ORAL | 0 refills | Status: DC

## 2021-07-10 MED ORDER — FLUOXETINE HCL 40 MG PO CAPS: 40.0000 mg | ORAL_CAPSULE | Freq: Every day | ORAL | 1 refills | Status: DC

## 2021-07-10 NOTE — Patient Instructions (Addendum)
1. Continue fluoxetine 40 mg daily  2. Continue Latuda 20 mg daily  3. Next appointment: 4/12 at 8:40, video

## 2021-07-17 ENCOUNTER — Emergency Department (HOSPITAL_COMMUNITY)

## 2021-07-17 ENCOUNTER — Other Ambulatory Visit: Payer: Self-pay

## 2021-07-17 ENCOUNTER — Emergency Department (HOSPITAL_COMMUNITY)
Admission: EM | Admit: 2021-07-17 | Discharge: 2021-07-17 | Disposition: A | Discharge status: Home / Self Care | Attending: Emergency Medicine | Admitting: Emergency Medicine

## 2021-07-17 ENCOUNTER — Encounter (HOSPITAL_COMMUNITY): Payer: Self-pay

## 2021-07-17 DIAGNOSIS — R1013 Epigastric pain: Secondary | ICD-10-CM | POA: Insufficient documentation

## 2021-07-17 DIAGNOSIS — R1011 Right upper quadrant pain: Secondary | ICD-10-CM | POA: Insufficient documentation

## 2021-07-17 DIAGNOSIS — N9489 Other specified conditions associated with female genital organs and menstrual cycle: Secondary | ICD-10-CM | POA: Diagnosis not present

## 2021-07-17 LAB — COMPREHENSIVE METABOLIC PANEL
ALT: 54 U/L — ABNORMAL HIGH (ref 0–44)
AST: 31 U/L (ref 15–41)
Albumin: 4.1 g/dL (ref 3.5–5.0)
Alkaline Phosphatase: 102 U/L (ref 38–126)
Anion gap: 8 (ref 5–15)
BUN: 12 mg/dL (ref 6–20)
CO2: 26 mmol/L (ref 22–32)
Calcium: 9.1 mg/dL (ref 8.9–10.3)
Chloride: 102 mmol/L (ref 98–111)
Creatinine, Ser: 0.91 mg/dL (ref 0.44–1.00)
GFR, Estimated: >60 mL/min (ref >60)
Glucose, Bld: 95 mg/dL (ref 70–99)
Potassium: 4.1 mmol/L (ref 3.5–5.1)
Sodium: 136 mmol/L (ref 135–145)
Total Bilirubin: 0.6 mg/dL (ref 0.3–1.2)
Total Protein: 7.8 g/dL (ref 6.5–8.1)

## 2021-07-17 LAB — CBC WITH DIFFERENTIAL/PLATELET
Abs Immature Granulocytes: 0.01 10*3/uL (ref 0.00–0.07)
Basophils Absolute: 0.1 10*3/uL (ref 0.0–0.1)
Basophils Relative: 1 %
Eosinophils Absolute: 0.1 10*3/uL (ref 0.0–0.5)
Eosinophils Relative: 2 %
HCT: 43.2 % (ref 36.0–46.0)
Hemoglobin: 14.3 g/dL (ref 12.0–15.0)
Immature Granulocytes: 0 %
Lymphocytes Relative: 27 %
Lymphs Abs: 1.5 10*3/uL (ref 0.7–4.0)
MCH: 30.6 pg (ref 26.0–34.0)
MCHC: 33.1 g/dL (ref 30.0–36.0)
MCV: 92.3 fL (ref 80.0–100.0)
Monocytes Absolute: 0.7 10*3/uL (ref 0.1–1.0)
Monocytes Relative: 13 %
Neutro Abs: 3.1 10*3/uL (ref 1.7–7.7)
Neutrophils Relative %: 57 %
Platelets: 236 10*3/uL (ref 150–400)
RBC: 4.68 MIL/uL (ref 3.87–5.11)
RDW: 14.6 % (ref 11.5–15.5)
WBC: 5.4 10*3/uL (ref 4.0–10.5)
nRBC: 0 % (ref 0.0–0.2)

## 2021-07-17 LAB — I-STAT BETA HCG BLOOD, ED (MC, WL, AP ONLY): I-stat hCG, quantitative: 175.2 m[IU]/mL — ABNORMAL HIGH (ref <5)

## 2021-07-17 LAB — URINALYSIS, ROUTINE W REFLEX MICROSCOPIC
Bacteria, UA: NONE SEEN
Bilirubin Urine: NEGATIVE
Glucose, UA: NEGATIVE mg/dL
Hgb urine dipstick: NEGATIVE
Ketones, ur: 5 mg/dL — AB
Leukocytes,Ua: NEGATIVE
Nitrite: NEGATIVE
Protein, ur: 30 mg/dL — AB
Specific Gravity, Urine: 1.03 (ref 1.005–1.030)
pH: 5 (ref 5.0–8.0)

## 2021-07-17 LAB — HCG, QUANTITATIVE, PREGNANCY: hCG, Beta Chain, Quant, S: 4 m[IU]/mL (ref <5)

## 2021-07-17 LAB — LIPASE, BLOOD: Lipase: 38 U/L (ref 11–51)

## 2021-07-17 MED ORDER — IOHEXOL 300 MG/ML  SOLN
100.0000 mL | Freq: Once | INTRAMUSCULAR | Status: AC | PRN
  Administered 2021-07-17: 100 mL via INTRAVENOUS

## 2021-07-17 MED ORDER — HYDROCODONE-ACETAMINOPHEN 5-325 MG PO TABS: 1.0000 | ORAL_TABLET | Freq: Four times a day (QID) | ORAL | 0 refills | Status: DC | PRN

## 2021-07-17 NOTE — ED Provider Triage Note (Signed)
Emergency Medicine Provider Triage Evaluation Note  Sharon Merritt , a 46 y.o. female  was evaluated in triage.  Pt complains of abdominal pain.  This has been occurring intermittently for the past 5 days, worse over the past few days.  Reports pain is located in the right upper quadrant and epigastric regions.  Seems to be worse after eating associated with nausea and vomiting.  No fevers.  History of bariatric surgery, normal bowel movements and passing gas..  Review of Systems  Positive: Abdominal pain, nausea, vomiting Negative: Fevers, chest pain, shortness of breath, diarrhea, constipation  Physical Exam  BP (!) 144/92 (BP Location: Right Arm)    Pulse 84    Temp 97.9 F (36.6 C) (Oral)    Resp 16    SpO2 95%  Gen:   Awake, no distress   Resp:  Normal effort  MSK:   Moves extremities without difficulty  Other:  Epigastric and right upper quadrant abdominal tenderness  Medical Decision Making  Medically screening exam initiated at 6:15 PM.  Appropriate orders placed.  Sharon Merritt was informed that the remainder of the evaluation will be completed by another provider, this initial triage assessment does not replace that evaluation, and the importance of remaining in the ED until their evaluation is complete.  Will initiate work-up with abdominal labs and right upper quadrant ultrasound   Sharon Merritt, [place] 07/17/21 1829

## 2021-07-17 NOTE — ED Notes (Signed)
Tried to place IV x 2 and unsuccessful. Will look for RN to start IV.

## 2021-07-17 NOTE — ED Provider Notes (Signed)
[facility] DEPT Provider Note   CSN: [id number] Arrival date & time: 07/17/21  1745     History  Chief Complaint  Patient presents with   Abdominal Pain    [facility]aron Merritt is a 46 y.o. female.  46 year old female with prior medical history as detailed below presents for evaluation.  Patient reports significant abdominal discomfort this past weekend.  She reports significant pain to the epigastrium in the right upper quadrant.  Pain improved after Saturday.  She reports today that her pain has almost resolved completely.  She denies fever.  She does have a prior history of gastric sleeve -this was placed on October 2022.  Dr. Kieth Brightly is her surgeon.  The history is provided by the patient.  Abdominal Pain Pain location:  Epigastric and RUQ Pain quality: aching and cramping   Pain radiates to:  Does not radiate Pain severity:  No pain Onset quality:  Sudden Duration:  3 days     Home Medications Prior to Admission medications   Medication Sig Start Date End Date Taking? Authorizing Provider  HYDROcodone-acetaminophen (NORCO/VICODIN) 5-325 MG tablet Take 1 tablet by mouth every 6 (six) hours as needed. 07/17/21  Yes Valarie Merino, MD  albuterol (VENTOLIN HFA) 108 (90 Base) MCG/ACT inhaler Inhale 2 puffs into the lungs daily as needed (Asthma).    [provider]  FLUoxetine (PROZAC) 40 MG capsule Take 1 capsule (40 mg total) by mouth daily. 08/21/21 02/17/22  Norman Clay, MD  fluticasone-salmeterol (ADVAIR HFA) 409-81 MCG/ACT inhaler Inhale 2 puffs into the lungs 2 (two) times daily.    [provider]  LORazepam (ATIVAN) 0.5 MG tablet TAKE 1 TABLET BY MOUTH 2 TIMES DAILY AS NEEDED FOR ANXIETY. 07/18/19   [place], Modena Nunnery, MD  lurasidone (LATUDA) 20 MG TABS tablet Take 1 tablet (20 mg total) by mouth daily. 09/24/21 12/23/21  Norman Clay, MD  nitroGLYCERIN (NITROSTAT) 0.4 MG SL tablet Place 1 tablet (0.4 mg total) under the  tongue every 5 (five) minutes as needed for chest pain. 02/09/19   [place], Modena Nunnery, MD  ondansetron (ZOFRAN-ODT) 4 MG disintegrating tablet Dissolve 1 tablet (4 mg total) by mouth every 6 (six) hours as needed for nausea or vomiting. 04/24/21   Kinsinger, [name], MD  pantoprazole (PROTONIX) 40 MG tablet Take 1 tablet (40 mg total) by mouth daily. 04/24/21   Kinsinger, [name], MD  valACYclovir (VALTREX) 1000 MG tablet Take 1,000 mg by mouth daily as needed (Epstein-Barr).    [provider]      Allergies    Patient has no known allergies.    Review of Systems   Review of Systems  Gastrointestinal:  Positive for abdominal pain.  All other systems reviewed and are negative.  Physical Exam Updated Vital Signs BP 134/83    Pulse (!) 55    Temp 97.9 F (36.6 C) (Oral)    Resp 18    SpO2 97%  Physical Exam Vitals and nursing note reviewed.  Constitutional:      General: She is not in acute distress.    Appearance: Normal appearance. She is well-developed.  HENT:     Head: Normocephalic and atraumatic.  Eyes:     Conjunctiva/sclera: Conjunctivae normal.     Pupils: Pupils are equal, round, and reactive to light.  Cardiovascular:     Rate and Rhythm: Normal rate and regular rhythm.     Heart sounds: Normal heart sounds.  Pulmonary:     Effort:  Pulmonary effort is normal. No respiratory distress.     Breath sounds: Normal breath sounds.  Abdominal:     General: There is no distension.     Palpations: Abdomen is soft.     Tenderness: There is abdominal tenderness in the epigastric area.  Musculoskeletal:        General: No deformity. Normal range of motion.     Cervical back: Normal range of motion and neck supple.  Skin:    General: Skin is warm and dry.  Neurological:     General: No focal deficit present.     Mental Status: She is alert and oriented to person, place, and time.    ED Results / Procedures / Treatments   Labs (all labs ordered are listed, but  only abnormal results are displayed) Labs Reviewed  COMPREHENSIVE METABOLIC PANEL - Abnormal; Notable for the following components:      Result Value   ALT 54 (*)    All other components within normal limits  URINALYSIS, ROUTINE W REFLEX MICROSCOPIC - Abnormal; Notable for the following components:   Color, Urine AMBER (*)    APPearance HAZY (*)    Ketones, ur 5 (*)    Protein, ur 30 (*)    All other components within normal limits  I-STAT BETA HCG BLOOD, ED ([facility], [facility], [facility] ONLY) - Abnormal; Notable for the following components:   I-stat hCG, quantitative 175.2 (*)    All other components within normal limits  LIPASE, BLOOD  CBC WITH DIFFERENTIAL/PLATELET  HCG, QUANTITATIVE, PREGNANCY    EKG None  Radiology CT ABDOMEN PELVIS W CONTRAST  Result Date: 07/17/2021 CLINICAL DATA:  Acute abdominal pain, initial encounter EXAM: CT ABDOMEN AND PELVIS WITH CONTRAST TECHNIQUE: Multidetector CT imaging of the abdomen and pelvis was performed using the standard protocol following bolus administration of intravenous contrast. RADIATION DOSE REDUCTION: This exam was performed according to the departmental dose-optimization program which includes automated exposure control, adjustment of the mA and/or kV according to patient size and/or use of iterative reconstruction technique. CONTRAST:  156m OMNIPAQUE IOHEXOL 300 MG/ML  SOLN COMPARISON:  Ultrasound from earlier in the same day. FINDINGS: Lower chest: Lung bases are free of acute infiltrate or sizable effusion. Minimal scarring is noted. Hepatobiliary: No focal liver abnormality is seen. No gallstones, gallbladder wall thickening, or biliary dilatation. The known cholelithiasis is not well appreciated on this exam. Pancreas: Unremarkable. No pancreatic ductal dilatation or surrounding inflammatory changes. Spleen: Normal in size without focal abnormality. Adrenals/Urinary Tract: Adrenal glands are within normal limits. Kidneys demonstrate a normal  enhancement pattern bilaterally. No renal calculi or obstructive changes are seen. The bladder is well distended. Stomach/Bowel: Appendix is within normal limits. No obstructive or inflammatory changes of the colon are noted. Small bowel is within normal limits. Changes of prior sleeve gastrectomy are noted. Vascular/Lymphatic: Aortic atherosclerosis. No enlarged abdominal or pelvic lymph nodes. Reproductive: Uterus and bilateral adnexa are unremarkable. Tubal ligation changes are noted. Other: No abdominal wall hernia or abnormality. No abdominopelvic ascites. Musculoskeletal: Mild sclerosis about the sacroiliac joints bilaterally. Degenerative changes of lumbar spine are seen. IMPRESSION: No acute abnormality noted. Known cholelithiasis is not well appreciated on this exam. Electronically Signed   By: MInez CatalinaM.D.   On: 07/17/2021 22:48   [place]domen Limited RUQ (LIVER/GB)  Result Date: 07/17/2021 CLINICAL DATA:  Right upper quadrant pain. EXAM: ULTRASOUND ABDOMEN LIMITED RIGHT UPPER QUADRANT COMPARISON:  None. FINDINGS: Gallbladder: The gallbladder is mildly contracted. A 1.2 cm shadowing  echogenic gallstone is seen within the gallbladder lumen. The gallbladder measures 3.9 mm in thickness. No sonographic Murphy sign noted by sonographer. Common bile duct: Diameter: 2.5 mm Liver: No focal lesion identified. Within normal limits in parenchymal echogenicity. Portal vein is patent on color Doppler imaging with normal direction of blood flow towards the liver. Other: None. IMPRESSION: 1. Cholelithiasis without acute cholecystitis. 2. Mild gallbladder wall thickening which is likely, in part, secondary to the mildly contracted nature of the gallbladder. Electronically Signed   By: Virgina Norfolk M.D.   On: 07/17/2021 19:17    Procedures Procedures    Medications Ordered in ED Medications  iohexol (OMNIPAQUE) 300 MG/ML solution 100 mL (100 mLs Intravenous Contrast Given 07/17/21 2235)    ED Course/  Medical Decision Making/ A&P                           Medical Decision Making Amount and/or Complexity of Data Reviewed Labs: ordered. Radiology: ordered.  Risk Prescription drug management.    Medical Screen Complete  This patient presented to the ED with complaint of abdominal pain.  This complaint involves an extensive number of treatment options. The initial differential diagnosis includes, but is not limited to, biliary colic, cholecystitis, pancreatitis, other intra-abdominal pathology  This presentation is: Acute, Self-Limited, Previously Undiagnosed, Uncertain Prognosis, Complicated, Systemic Symptoms, and Threat to Life/Bodily Function  Patient is with reported episode of significant abdominal discomfort 3 days prior.  Pain is improved significantly in the interval.  Patient with history of gastric sleeve placed in October 2022 she is already known to Dr. Kieth Brightly with [facility].  Work-up performed in the ED demonstrates evidence of cholelithiasis without cholecystitis.  Patient is significantly improved after ED evaluation  She does understand need for close outpatient follow-up with surgery regarding her gallstones and likely episode of biliary colic.  Strict return precautions given and understood.   Co morbidities that complicated the patient's evaluation  Hx of gastric sleeve   Additional history obtained:  External records from outside sources obtained and reviewed including prior ED visits and prior Inpatient records.    Lab Tests:  I ordered and personally interpreted labs.  The pertinent results include: CBC, CMP, lipase   Imaging Studies ordered:  I ordered imaging studies including right upper quadrant ultrasound and CT abdomen pelvis I independently visualized and interpreted obtained imaging which showed cholelithiasis without other acute pathology I agree with the radiologist interpretation.   Cardiac Monitoring:  The patient was maintained  on a cardiac monitor.  I personally viewed and interpreted the cardiac monitor which showed an underlying rhythm of: Sinus rhythm        Problem List / ED Course:  Cholelithiasis/biliary colic   Reevaluation:  After the interventions noted above, I reevaluated the patient and found that they have: resolved    Dispostion:  After consideration of the diagnostic results and the patients response to treatment, I feel that the patent would benefit from close outpatient follow-up.          Final Clinical Impression(s) / ED Diagnoses Final diagnoses:  RUQ abdominal pain    Rx / DC Orders ED Discharge Orders          Ordered    HYDROcodone-acetaminophen (NORCO/VICODIN) 5-325 MG tablet  Every 6 hours PRN        07/17/21 2301              Valarie Merino, MD 07/17/21 2307

## 2021-07-17 NOTE — Discharge Instructions (Signed)
Return for any problem.  ?

## 2021-07-17 NOTE — ED Triage Notes (Signed)
Pt c/o intermittent abdominal pain x5 days. Pt c/o N/V x5 days, denies diarrhea. Pt states she had bariatric surgery on 03/2021. Pt states last bowel movement was today.

## 2021-07-17 NOTE — ED Notes (Signed)
IV placed and CT notified. They stated they have one ahead of this patient.

## 2021-09-11 ENCOUNTER — Ambulatory Visit: Admitting: Skilled Nursing Facility1

## 2021-09-25 ENCOUNTER — Other Ambulatory Visit: Payer: Self-pay

## 2021-09-25 ENCOUNTER — Encounter: Attending: General Surgery | Admitting: Skilled Nursing Facility1

## 2021-09-25 DIAGNOSIS — E669 Obesity, unspecified: Secondary | ICD-10-CM | POA: Insufficient documentation

## 2021-09-25 NOTE — Progress Notes (Signed)
Bariatric Nutrition Follow-Up Visit ?Medical Nutrition Therapy  ? ? ?NUTRITION ASSESSMENT ?  ?Surgery date: 04/23/2021 ?Surgery type: sleeve ?Start weight at [facility]: 253 ?Weight today: 207.8 pounds ?  ?Body Composition Scale Date ?06/12/22 Date ?09/25/2021  ?Current Body Weight 232.8 207.8  ?Total Body Fat % 42.4 39.2  ?Visceral Fat 13 11  ?Fat-Free Mass % 57.5 60.7  ? Total Body Water % 43.2 44.8  ?Muscle-Mass lbs 32.8 32.6  ?BMI 37.5 33.4  ?Body Fat Displacement    ?       Torso  lbs 61.3 50.5  ?       Left Leg  lbs 12.2 10.1  ?       Right Leg  lbs 12.2 10.1  ?       Left Arm  lbs 6.1 5.0  ?       Right Arm   lbs 6.1 5.0  ? ?Clinical  ?Medical hx: Bipolar, Epstein barr infection,  lyme disease, [place] spotted fever, HTN, fibromyalgia  ?Medications: Advair, albuterol, motrin, Latuda, Prozac, lorazepam   ?Labs:  ?Notable signs/symptoms: hip and lower back pain ?Any previous deficiencies? No ?  ?Lifestyle & Dietary Hx ? ?Pt state she is getting her gallbladder out this coming Monday symptoms: nausea, abdominal pain. ?Pt states her husband is terminally ill and is overwhelmed with a lot right now.  ?Pt states she does not like the taste of plain water so she avoids it.  ?Pt states she does not do a lot of activity due to caring for her husband. ? ?Dietitian congratulated pt for continuing to eat throughout the day, as well as thinking through her food choices, considering everything she is going through. ? ?Estimated daily fluid intake: not really sure but thinking over 64 ounces oz ?Estimated daily protein intake: 70+ g ?Supplements: multi and calcium  ?Current average weekly physical activity: ADL's ? ?24-Hr Dietary Recall ?First Meal: protein shake or sausage + eggs ?Snack: cheese + BBQ pork rinds ?Second Meal: beans + shrimp + cheese or sausage and eggs ?Snack: pork rinds ?Third Meal: chicken cobb from [employer] ?Snack: nuts and sesame sticks ?Beverages: gatorade zero, limited water, unsweet tea ? ?Post-Op  Goals/ Signs/ Symptoms ?Using straws: no ?Drinking while eating: no ?Chewing/swallowing difficulties: no ?Changes in vision: no ?Changes to mood/headaches: no ?Hair loss/changes to skin/nails: no ?Difficulty focusing/concentrating: no ?Sweating: no ?Limb weakness: no ?Dizziness/lightheadedness: no ?Palpitations: no  ?Carbonated/caffeinated beverages: no ?N/V/D/C/Gas: no ?Abdominal pain: no ?Dumping syndrome: no ? ?  ?NUTRITION DIAGNOSIS  ?Overweight/obesity ([place]-3.3) related to past poor dietary habits and physical inactivity as evidenced by completed bariatric surgery and following dietary guidelines for continued weight loss and healthy nutrition status. ?  ?  ?NUTRITION INTERVENTION ?Nutrition counseling (C-1) and education (E-2) to facilitate bariatric surgery goals, including: ?Diet advancement to the next phase (phase 5) now including starchy vegetable ?The importance of consuming adequate calories as well as certain nutrients daily due to the body's need for essential vitamins, minerals, and fats ?The importance of daily physical activity and to reach a goal of at least 150 minutes of moderate to vigorous physical activity weekly (or as directed by their physician) due to benefits such as increased musculature and improved lab values ?The importance of intuitive eating specifically learning hunger-satiety cues and understanding the importance of learning a new body: The importance of mindful eating to avoid grazing behaviors  ? ?Goals: ?-continue to stop at satisfaction ?-choose nutrient dense, low fat snack options ?-ensure that you are  drinking throughout the day ?-create large salads for 3 days to eat on, having a base of mixed greens + 3 non-starchy vegetables + a fat + a protein. ?-rotisserie chicken for a quick protein ?-try edamame ? ?Handouts Provided Include  ?Phase 5 ? ?Learning Style & Readiness for Change ?Teaching method utilized: Visual & Auditory  ?Demonstrated degree of understanding via: Teach  Back  ?Readiness Level: action ?Barriers to learning/adherence to lifestyle change: non identified  ? ?RD's Notes for Next Visit ?Assess adherence to pt chosen goals  ? ? ?MONITORING & EVALUATION ?Dietary intake, weekly physical activity, body weight ? ?Next Steps ?Patient is to follow-up in 3-4 months ? ?

## 2021-10-07 NOTE — Progress Notes (Signed)
Virtual Visit via Video Note ? ?I connected with Sharon Merritt on 10/09/21 at  8:40 AM EDT by a video enabled telemedicine application and verified that I am speaking with the correct person using two identifiers. ? ?Location: ?Patient: home ?Provider: office ?Persons participated in the visit- patient, provider  ?  ?I discussed the limitations of evaluation and management by telemedicine and the availability of in person appointments. The patient expressed understanding and agreed to proceed. ? ?  ?I discussed the assessment and treatment plan with the patient. The patient was provided an opportunity to ask questions and all were answered. The patient agreed with the plan and demonstrated an understanding of the instructions. ?  ?The patient was advised to call back or seek an in-person evaluation if the symptoms worsen or if the condition fails to improve as anticipated. ? ?I provided 13 minutes of non-face-to-face time during this encounter. ? ? ?Norman Clay, MD ? ? ? ?BH MD/PA/NP OP Progress Note ? ?10/09/2021 9:05 AM ?Sharon Merritt  ?MRN:  [id number] ? ?Chief Complaint:  ?Chief Complaint  ?Patient presents with  ? Follow-up  ? Other  ? ?HPI:  ?- She was fount to have calculus of gallbladder with chronic cholecystitis without obstruction ? ?This is a follow-up appointment for bipolar disorder and an anxiety.  ?She states that she had a few episodes of feeling down.  She wanted to stay in the bed, having hypersomnia, although she was able to go to work.  She states that her husband is getting worse.  It is hard to watch him.  However, he has been able to go outside, using mobility scooter provided to him while he is on oxygen.  She enjoys the time together.  She stopped going to school for this semester as she wants to spend much time as possible with her husband.  She is also concerned of forgetfulness.  She sleeps at least 7 hours.  She denies change in appetite.  She is very forgetful.  She denies SI.  She  feels more anxious.  She denies decreased need for sleep, euphoria or increased goal-directed activity.  She denies alcohol use or drug use.  She has not had much tremors since she had gastric surgery.  She feels scared of switching from [place] to other medication as she used to be doing very well.  She agrees to try bupropion at this time.  ? ?Employment: [employer], start at 6:30 ?Support: husband, dog ?Household: husband, daughter, who will go to nursing program ?Marital status: married ?Number of children: 3 ?Education: in the process of getting bachelor in business ? ? ? ? ? ? ? ?Visit Diagnosis:  ?  ICD-10-CM   ?1. Bipolar disorder, in partial remission, most recent episode depressed ([place])  F31.75   ?  ?2. Anxiety state  F41.1   ?  ?3. Tardive dyskinesia  G24.01   ?  ? ? ?Past Psychiatric History: Please see initial evaluation for full details. I have reviewed the history. No updates at this time.  ?  ? ?Past Medical History:  ?Past Medical History:  ?Diagnosis Date  ? Anxiety   ? Asthma   ? Bipolar 1 disorder ([place])   ? Breast discharge   ? Depression   ? [name] infection   ? Essential hypertension   ? Fibromyalgia   ? Lyme disease   ? Pneumonia   ? [facility] spotted fever   ?  ?Past Surgical History:  ?Procedure Laterality Date  ?  LAPAROSCOPIC GASTRIC SLEEVE RESECTION N/A 04/23/2021  ? Procedure: LAPAROSCOPIC GASTRIC SLEEVE RESECTION;  Surgeon: Kieth Brightly, [name], MD;  Location: [facility];  Service: General;  Laterality: N/A;  ? TUBAL LIGATION    ? UPPER GI ENDOSCOPY N/A 04/23/2021  ? Procedure: UPPER GI ENDOSCOPY;  Surgeon: Kinsinger, [name], MD;  Location: [facility];  Service: General;  Laterality: N/A;  ? WISDOM TOOTH EXTRACTION    ? ? ?Family Psychiatric History: Please see initial evaluation for full details. I have reviewed the history. No updates at this time.  ?  ? ?Family History:  ?Family History  ?Problem Relation Age of Onset  ? COPD Mother   ? Depression Mother   ? Diabetes Father   ?  Hypertension Father   ? Stroke Father   ? Bipolar disorder Sister   ? Post-traumatic stress disorder Sister   ? Alcohol abuse Sister   ? Drug abuse Sister   ? Pulmonary embolism Sister   ? Bipolar disorder Cousin   ? Schizophrenia Cousin   ? ? ?Social History:  ?Social History  ? ?Socioeconomic History  ? Marital status: Married  ?  Spouse name: [name]   ? Number of children: 3  ? Years of education: Assoc   ? Highest education level: Not on file  ?Occupational History  ?  Employer: [employer]  ?Tobacco Use  ? Smoking status: Former  ?  Types: Cigarettes  ?  Quit date: 02/14/2009  ?  Years since quitting: 12.6  ? Smokeless tobacco: Never  ?Vaping Use  ? Vaping Use: Never used  ?Substance and Sexual Activity  ? Alcohol use: Not Currently  ?  Alcohol/week: 0.0 standard drinks  ?  Comment: Rarely   ? Drug use: No  ? Sexual activity: Not Currently  ?  Birth control/protection: Surgical  ?Other Topics Concern  ? Not on file  ?Social History Narrative  ? Patient lives at home with her husband [name]) and her children.   ? Patient works full time.  ? Caffeine- Tea 3-4 times daily.  ? Patient is right-handed.  ? Patient has a Geophysicist/field seismologist.  ?   ?   ? ?Social Determinants of Health  ? ?Financial Resource Strain: Not on file  ?Food Insecurity: Not on file  ?Transportation Needs: Not on file  ?Physical Activity: Not on file  ?Stress: Not on file  ?Social Connections: Not on file  ? ? ?Allergies: No Known Allergies ? ?Metabolic Disorder Labs: ?No results found for: HGBA1C, MPG ?No results found for: PROLACTIN ?Lab Results  ?Component Value Date  ? CHOL 216 (H) 07/20/2019  ? TRIG 123 07/20/2019  ? HDL 65 07/20/2019  ? CHOLHDL 3.3 07/20/2019  ? VLDL 27 02/15/2013  ? [place] 128 (H) 07/20/2019  ? LDLCALC 100 (H) 02/15/2013  ? ?Lab Results  ?Component Value Date  ? TSH 2.163 07/24/2015  ? TSH 3.039 10/09/2014  ? ? ?Therapeutic Level Labs: ?No results found for: LITHIUM ?No results found for: VALPROATE ?No components found  for:  CBMZ ? ?Current Medications: ?Current Outpatient Medications  ?Medication Sig Dispense Refill  ? buPROPion (WELLBUTRIN) 75 MG tablet Take 1 tablet (75 mg total) by mouth 2 (two) times daily. 60 tablet 1  ? albuterol (VENTOLIN HFA) 108 (90 Base) MCG/ACT inhaler Inhale 2 puffs into the lungs daily as needed (Asthma).    ? FLUoxetine (PROZAC) 40 MG capsule Take 1 capsule (40 mg total) by mouth daily. 90 capsule 1  ? fluticasone-salmeterol (ADVAIR HFA) 115-21  MCG/ACT inhaler Inhale 2 puffs into the lungs 2 (two) times daily.    ? HYDROcodone-acetaminophen (NORCO/VICODIN) 5-325 MG tablet Take 1 tablet by mouth every 6 (six) hours as needed. 10 tablet 0  ? LORazepam (ATIVAN) 0.5 MG tablet TAKE 1 TABLET BY MOUTH 2 TIMES DAILY AS NEEDED FOR ANXIETY. 45 tablet 1  ? lurasidone (LATUDA) 20 MG TABS tablet Take 1 tablet (20 mg total) by mouth daily. 90 tablet 0  ? nitroGLYCERIN (NITROSTAT) 0.4 MG SL tablet Place 1 tablet (0.4 mg total) under the tongue every 5 (five) minutes as needed for chest pain. 10 tablet 1  ? ondansetron (ZOFRAN-ODT) 4 MG disintegrating tablet Dissolve 1 tablet (4 mg total) by mouth every 6 (six) hours as needed for nausea or vomiting. 20 tablet 0  ? pantoprazole (PROTONIX) 40 MG tablet Take 1 tablet (40 mg total) by mouth daily. 90 tablet 0  ? valACYclovir (VALTREX) 1000 MG tablet Take 1,000 mg by mouth daily as needed (Epstein-Barr).    ? ?No current facility-administered medications for this visit.  ? ? ? ?Musculoskeletal: ?Strength & Muscle Tone:  N/A ?Gait & Station:  N/A ?Patient leans: N/A ? ?Psychiatric Specialty Exam: ?Review of Systems  ?Psychiatric/Behavioral:  Positive for decreased concentration and dysphoric mood. Negative for agitation, behavioral problems, confusion, hallucinations, self-injury, sleep disturbance and suicidal ideas. The patient is nervous/anxious. The patient is not hyperactive.   ?All other systems reviewed and are negative.  ?There were no vitals taken for this  visit.There is no height or weight on file to calculate BMI.  ?General Appearance: Fairly Groomed  ?Eye Contact:  Good  ?Speech:  Clear and Coherent  ?Volume:  Normal  ?Mood:  Depressed  ?Affect:  Appropriate,

## 2021-10-09 ENCOUNTER — Encounter: Payer: Self-pay | Admitting: Psychiatry

## 2021-10-09 ENCOUNTER — Telehealth (INDEPENDENT_AMBULATORY_CARE_PROVIDER_SITE_OTHER): Admitting: Psychiatry

## 2021-10-09 DIAGNOSIS — G2401 Drug induced subacute dyskinesia: Secondary | ICD-10-CM

## 2021-10-09 DIAGNOSIS — F3175 Bipolar disorder, in partial remission, most recent episode depressed: Secondary | ICD-10-CM | POA: Diagnosis not present

## 2021-10-09 DIAGNOSIS — F411 Generalized anxiety disorder: Secondary | ICD-10-CM

## 2021-10-09 MED ORDER — BUPROPION HCL 75 MG PO TABS: 75.0000 mg | ORAL_TABLET | Freq: Two times a day (BID) | ORAL | 1 refills | Status: DC

## 2021-10-09 NOTE — Patient Instructions (Signed)
Continue fluoxetine 40 mg daily  ?Continue Latuda 20 mg daily  ?Start bupropion 75 mg twice a day  ? Next appointment: 5/17 at 8:30  ?

## 2021-10-10 DIAGNOSIS — R7303 Prediabetes: Secondary | ICD-10-CM | POA: Insufficient documentation

## 2021-10-14 DIAGNOSIS — E782 Mixed hyperlipidemia: Secondary | ICD-10-CM | POA: Insufficient documentation

## 2021-10-14 DIAGNOSIS — J4520 Mild intermittent asthma, uncomplicated: Secondary | ICD-10-CM | POA: Insufficient documentation

## 2021-11-11 NOTE — Progress Notes (Signed)
Virtual Visit via Video Note ? ?I connected with Sharon Merritt on 11/13/21 at  8:30 AM EDT by a video enabled telemedicine application and verified that I am speaking with the correct person using two identifiers. ? ?Location: ?Patient: home ?Provider: office ?Persons participated in the visit- patient, provider  ?  ?I discussed the limitations of evaluation and management by telemedicine and the availability of in person appointments. The patient expressed understanding and agreed to proceed. ? ?  ?I discussed the assessment and treatment plan with the patient. The patient was provided an opportunity to ask questions and all were answered. The patient agreed with the plan and demonstrated an understanding of the instructions. ?  ?The patient was advised to call back or seek an in-person evaluation if the symptoms worsen or if the condition fails to improve as anticipated. ? ?I provided 16 minutes of non-face-to-face time during this encounter. ? ? ?Norman Clay, MD ? ? ? ?BH MD/PA/NP OP Progress Note ? ?11/13/2021 9:05 AM ?Joelene Millin Damas  ?MRN:  [id number] ? ?Chief Complaint:  ?Chief Complaint  ?Patient presents with  ? Follow-up  ? Other  ? ?HPI:  ?This is a follow-up appointment for bipolar disorder.  ?She states that she had an episode of feeling irritable, sad and wanting to cry.  It was so rough as she was feeling so down.  Although she had a thought of "If I go, I will go," she denies any thoughts lately, and denies any SI.  She enjoys time with her husband, although it is difficult for her to see him suffering.  She is doing well at work, and loves her job.  She notices that she has been able to read compliments of the book since starting bupropion.  She had an episode of decreased need for sleep for 2 to 3 days.  She was wide awake, and was doing work.  She states that it was a weird experience.  She denies euphonia, or increased goal-directed activity.  She has slight decrease in appetite.  She denies SI,  HI, hallucinations.  Although she occasionally notices some tremors, it has been subsiding, and she denies any concern.  She feels worried about switching to other medication as the current combination have been working well.  She is willing to try higher dose of Latuda at this time.  ? ?Employment: [employer], start at 6:30 ?Support: husband, dog ?Household: husband, daughter, who will go to nursing program ?Marital status: married ?Number of children: 3 ?Education: in the process of getting bachelor in business ? ?Visit Diagnosis:  ?  ICD-10-CM   ?1. Bipolar disorder, in partial remission, most recent episode depressed (Sharon Merritt)  F31.75   ?  ?2. Anxiety state  F41.1   ?  ?3. Tardive dyskinesia  G24.01   ?  ? ? ?Past Psychiatric History: Please see initial evaluation for full details. I have reviewed the history. No updates at this time.  ?  ? ?Past Medical History:  ?Past Medical History:  ?Diagnosis Date  ? Anxiety   ? Asthma   ? Bipolar 1 disorder ([place])   ? Breast discharge   ? Depression   ? Sharon Merritt Patient infection   ? Essential hypertension   ? Fibromyalgia   ? Lyme disease   ? Pneumonia   ? [facility] spotted fever   ?  ?Past Surgical History:  ?Procedure Laterality Date  ? LAPAROSCOPIC GASTRIC SLEEVE RESECTION N/A 04/23/2021  ? Procedure: LAPAROSCOPIC GASTRIC SLEEVE RESECTION;  Surgeon: Gurney Maxin  Marjory Lies, MD;  Location: [facility];  Service: General;  Laterality: N/A;  ? TUBAL LIGATION    ? UPPER GI ENDOSCOPY N/A 04/23/2021  ? Procedure: UPPER GI ENDOSCOPY;  Surgeon: Kinsinger, [name], MD;  Location: [facility];  Service: General;  Laterality: N/A;  ? WISDOM TOOTH EXTRACTION    ? ? ?Family Psychiatric History: Please see initial evaluation for full details. I have reviewed the history. No updates at this time.  ?  ? ?Family History:  ?Family History  ?Problem Relation Age of Onset  ? COPD Mother   ? Depression Mother   ? Diabetes Father   ? Hypertension Father   ? Stroke Father   ? Bipolar disorder Sister   ?  Post-traumatic stress disorder Sister   ? Alcohol abuse Sister   ? Drug abuse Sister   ? Pulmonary embolism Sister   ? Bipolar disorder Cousin   ? Schizophrenia Cousin   ? ? ?Social History:  ?Social History  ? ?Socioeconomic History  ? Marital status: Married  ?  Spouse name: Sharon Merritt   ? Number of children: 3  ? Years of education: Assoc   ? Highest education level: Not on file  ?Occupational History  ?  Employer: [employer]  ?Tobacco Use  ? Smoking status: Former  ?  Types: Cigarettes  ?  Quit date: 02/14/2009  ?  Years since quitting: 12.7  ? Smokeless tobacco: Never  ?Vaping Use  ? Vaping Use: Never used  ?Substance and Sexual Activity  ? Alcohol use: Not Currently  ?  Alcohol/week: 0.0 standard drinks  ?  Comment: Rarely   ? Drug use: No  ? Sexual activity: Not Currently  ?  Birth control/protection: Surgical  ?Other Topics Concern  ? Not on file  ?Social History Narrative  ? Patient lives at home with her husband Sharon Merritt) and her children.   ? Patient works full time.  ? Caffeine- Tea 3-4 times daily.  ? Patient is right-handed.  ? Patient has a Geophysicist/field seismologist.  ?   ?   ? ?Social Determinants of Health  ? ?Financial Resource Strain: Not on file  ?Food Insecurity: Not on file  ?Transportation Needs: Not on file  ?Physical Activity: Not on file  ?Stress: Not on file  ?Social Connections: Not on file  ? ? ?Allergies: No Known Allergies ? ?Metabolic Disorder Labs: ?No results found for: HGBA1C, MPG ?No results found for: PROLACTIN ?Lab Results  ?Component Value Date  ? CHOL 216 (H) 07/20/2019  ? TRIG 123 07/20/2019  ? HDL 65 07/20/2019  ? CHOLHDL 3.3 07/20/2019  ? VLDL 27 02/15/2013  ? [place] 128 (H) 07/20/2019  ? LDLCALC 100 (H) 02/15/2013  ? ?Lab Results  ?Component Value Date  ? TSH 2.163 07/24/2015  ? TSH 3.039 10/09/2014  ? ? ?Therapeutic Level Labs: ?No results found for: LITHIUM ?No results found for: VALPROATE ?No components found for:  CBMZ ? ?Current Medications: ?Current Outpatient Medications   ?Medication Sig Dispense Refill  ? albuterol (VENTOLIN HFA) 108 (90 Base) MCG/ACT inhaler Inhale 2 puffs into the lungs daily as needed (Asthma).    ? [START ON 12/09/2021] buPROPion (WELLBUTRIN) 75 MG tablet Take 1 tablet (75 mg total) by mouth 2 (two) times daily. 60 tablet 0  ? FLUoxetine (PROZAC) 40 MG capsule Take 1 capsule (40 mg total) by mouth daily. 90 capsule 1  ? fluticasone-salmeterol (ADVAIR HFA) 115-21 MCG/ACT inhaler Inhale 2 puffs into the lungs 2 (two) times daily.    ?  HYDROcodone-acetaminophen (NORCO/VICODIN) 5-325 MG tablet Take 1 tablet by mouth every 6 (six) hours as needed. 10 tablet 0  ? LORazepam (ATIVAN) 0.5 MG tablet TAKE 1 TABLET BY MOUTH 2 TIMES DAILY AS NEEDED FOR ANXIETY. 45 tablet 1  ? lurasidone (LATUDA) 20 MG TABS tablet Take 1 tablet (20 mg total) by mouth daily. 90 tablet 0  ? nitroGLYCERIN (NITROSTAT) 0.4 MG SL tablet Place 1 tablet (0.4 mg total) under the tongue every 5 (five) minutes as needed for chest pain. 10 tablet 1  ? ondansetron (ZOFRAN-ODT) 4 MG disintegrating tablet Dissolve 1 tablet (4 mg total) by mouth every 6 (six) hours as needed for nausea or vomiting. 20 tablet 0  ? pantoprazole (PROTONIX) 40 MG tablet Take 1 tablet (40 mg total) by mouth daily. 90 tablet 0  ? valACYclovir (VALTREX) 1000 MG tablet Take 1,000 mg by mouth daily as needed (Epstein-Barr).    ? ?No current facility-administered medications for this visit.  ? ? ? ?Musculoskeletal: ?Strength & Muscle Tone:  N/A ?Gait & Station:  N/A ?Patient leans:  N/A ? ?Psychiatric Specialty Exam: ?Review of Systems  ?Psychiatric/Behavioral:  Positive for dysphoric mood and sleep disturbance. Negative for agitation, behavioral problems, confusion, decreased concentration, hallucinations, self-injury and suicidal ideas. The patient is nervous/anxious. The patient is not hyperactive.   ?All other systems reviewed and are negative.  ?There were no vitals taken for this visit.There is no height or weight on file to  calculate BMI.  ?General Appearance: Fairly Groomed  ?Eye Contact:  Good  ?Speech:  Clear and Coherent  ?Volume:  Normal  ?Mood:  Depressed  ?Affect:  Appropriate, Congruent, and slightly down  ?Thought Process:

## 2021-11-13 ENCOUNTER — Encounter: Payer: Self-pay | Admitting: Psychiatry

## 2021-11-13 ENCOUNTER — Telehealth (INDEPENDENT_AMBULATORY_CARE_PROVIDER_SITE_OTHER): Admitting: Psychiatry

## 2021-11-13 DIAGNOSIS — F411 Generalized anxiety disorder: Secondary | ICD-10-CM | POA: Diagnosis not present

## 2021-11-13 DIAGNOSIS — F3175 Bipolar disorder, in partial remission, most recent episode depressed: Secondary | ICD-10-CM

## 2021-11-13 DIAGNOSIS — G2401 Drug induced subacute dyskinesia: Secondary | ICD-10-CM

## 2021-11-13 MED ORDER — BUPROPION HCL 75 MG PO TABS: 75.0000 mg | ORAL_TABLET | Freq: Two times a day (BID) | ORAL | 0 refills | Status: DC

## 2021-11-13 NOTE — Patient Instructions (Signed)
Continue fluoxetine 40 mg daily  ?Increase Latuda 40 mg daily  ?Continue bupropion 75 mg twice a day  ? Next appointment: 6/14 at 8:30 ?

## 2021-11-27 ENCOUNTER — Other Ambulatory Visit: Payer: Self-pay | Admitting: Psychiatry

## 2021-11-27 DIAGNOSIS — F3175 Bipolar disorder, in partial remission, most recent episode depressed: Secondary | ICD-10-CM
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
## 2021-12-11 ENCOUNTER — Encounter: Payer: Self-pay | Admitting: Psychiatry

## 2021-12-11 ENCOUNTER — Telehealth (INDEPENDENT_AMBULATORY_CARE_PROVIDER_SITE_OTHER): Admitting: Psychiatry

## 2021-12-11 DIAGNOSIS — F3131 Bipolar disorder, current episode depressed, mild: Secondary | ICD-10-CM | POA: Diagnosis not present

## 2021-12-11 DIAGNOSIS — R5383 Other fatigue: Secondary | ICD-10-CM | POA: Diagnosis not present

## 2021-12-11 DIAGNOSIS — F411 Generalized anxiety disorder: Secondary | ICD-10-CM

## 2021-12-11 MED ORDER — LURASIDONE HCL 60 MG PO TABS: 60.0000 mg | ORAL_TABLET | Freq: Every evening | ORAL | 0 refills | Status: DC

## 2021-12-11 NOTE — Patient Instructions (Signed)
Continue fluoxetine 40 mg daily  Increase Latuda 60 mg daily  Continue bupropion 75 mg twice a day   Next appointment: 7/12 at 10:30, video

## 2021-12-12 ENCOUNTER — Encounter: Payer: Self-pay | Admitting: Psychiatry

## 2021-12-12 LAB — COMPREHENSIVE METABOLIC PANEL
ALT: 23 IU/L (ref 0–32)
AST: 21 IU/L (ref 0–40)
Albumin/Globulin Ratio: 1.7 (ref 1.2–2.2)
Albumin: 4.3 g/dL (ref 3.8–4.8)
Alkaline Phosphatase: 83 IU/L (ref 44–121)
BUN/Creatinine Ratio: 13 (ref 9–23)
BUN: 13 mg/dL (ref 6–24)
Bilirubin Total: 0.5 mg/dL (ref 0.0–1.2)
CO2: 23 mmol/L (ref 20–29)
Calcium: 9.1 mg/dL (ref 8.7–10.2)
Chloride: 98 mmol/L (ref 96–106)
Creatinine, Ser: 1.02 mg/dL — ABNORMAL HIGH (ref 0.57–1.00)
Globulin, Total: 2.6 g/dL (ref 1.5–4.5)
Glucose: 86 mg/dL (ref 70–99)
Potassium: 4.4 mmol/L (ref 3.5–5.2)
Sodium: 135 mmol/L (ref 134–144)
Total Protein: 6.9 g/dL (ref 6.0–8.5)
eGFR: 68 mL/min/{1.73_m2} (ref >59)

## 2021-12-12 LAB — CBC
Hematocrit: 42 % (ref 34.0–46.6)
Hemoglobin: 14.7 g/dL (ref 11.1–15.9)
MCH: 32.1 pg (ref 26.6–33.0)
MCHC: 35 g/dL (ref 31.5–35.7)
MCV: 92 fL (ref 79–97)
Platelets: 239 10*3/uL (ref 150–450)
RBC: 4.58 x10E6/uL (ref 3.77–5.28)
RDW: 12.4 % (ref 11.7–15.4)
WBC: 4.6 10*3/uL (ref 3.4–10.8)

## 2021-12-12 LAB — TSH: TSH: 2.14 u[IU]/mL (ref 0.450–4.500)

## 2021-12-12 LAB — VITAMIN B12: Vitamin B-12: 618 pg/mL (ref 232–1245)

## 2021-12-25 ENCOUNTER — Encounter: Attending: General Surgery | Admitting: Skilled Nursing Facility1

## 2021-12-25 ENCOUNTER — Encounter: Payer: Self-pay | Admitting: Skilled Nursing Facility1

## 2021-12-25 DIAGNOSIS — Z6837 Body mass index (BMI) 37.0-37.9, adult: Secondary | ICD-10-CM | POA: Diagnosis present

## 2021-12-25 NOTE — Progress Notes (Signed)
Bariatric Nutrition Follow-Up Visit Medical Nutrition Therapy    NUTRITION ASSESSMENT   Surgery date: 04/23/2021 Surgery type: sleeve Start weight at [facility]: 253 Weight today: 207.8 pounds   Body Composition Scale Date 06/12/22 Date 09/25/2021 12/25/2021  Current Body Weight 232.8 207.8 203.8  Total Body Fat % 42.4 39.2 38.8  Visceral Fat 13 11 11   Fat-Free Mass % 57.5 60.7 61.1   Total Body Water % 43.2 44.8 45  Muscle-Mass lbs 32.8 32.6 32.4  BMI 37.5 33.4 32.8  Body Fat Displacement            Torso  lbs 61.3 50.5 48.9         Left Leg  lbs 12.2 10.1 9.7         Right Leg  lbs 12.2 10.1 9.7         Left Arm  lbs 6.1 5.0 4.8         Right Arm   lbs 6.1 5.0 4.8   Clinical  Medical hx: Bipolar, Epstein barr infection,  lyme disease, rocky mountain spotted fever, HTN, fibromyalgia  Medications: Advair, albuterol, motrin, Latuda, Prozac, lorazepam   Labs:  Notable signs/symptoms: hip and lower back pain Any previous deficiencies? No   Lifestyle & Dietary Hx  Pt states she is feeling overwhelmed with eating.  Pt state she grazes a lot at work.   Educated pt on grazing type behaviors and a reduction of non starchy vegetables as well as feeling confident in herself that she is doing right by her body.   Pt states she has a hard time remembering things which makes having confidence in her food choices harder.   Estimated daily fluid intake: not really sure but thinking over 64 ounces oz Estimated daily protein intake: 70+ g Supplements: multi and calcium  Current average weekly physical activity: ADL's; feels guilty to leave her husband to go to the gym  24-Hr Dietary Recall First Meal 7:30: pepporoni  Snack 9:00: sesame sticks Second Meal 11:30: taco meat + cheese + lettuce + tomatoes + cottage cheese Snack 2pm: gronola kodiak bar Third Meal: eaten out: fried pickles  + fish taco Snack:  Beverages: suagr free coffee,   Post-Op Goals/ Signs/ Symptoms Using straws:  no Drinking while eating: no Chewing/swallowing difficulties: no Changes in vision: no Changes to mood/headaches: no Hair loss/changes to skin/nails: no Difficulty focusing/concentrating: no Sweating: no Limb weakness: no Dizziness/lightheadedness: no Palpitations: no  Carbonated/caffeinated beverages: no N/V/D/C/Gas: no Abdominal pain: no Dumping syndrome: no    NUTRITION DIAGNOSIS  Overweight/obesity ([place]-3.3) related to past poor dietary habits and physical inactivity as evidenced by completed bariatric surgery and following dietary guidelines for continued weight loss and healthy nutrition status.     NUTRITION INTERVENTION Nutrition counseling (C-1) and education (E-2) to facilitate bariatric surgery goals, including: The importance of consuming adequate calories as well as certain nutrients daily due to the body's need for essential vitamins, minerals, and fats The importance of daily physical activity and to reach a goal of at least 150 minutes of moderate to vigorous physical activity weekly (or as directed by their physician) due to benefits such as increased musculature and improved lab values The importance of intuitive eating specifically learning hunger-satiety cues and understanding the importance of learning a new body: The importance of mindful eating to avoid grazing behaviors   Goals: -try a youtube or app at home fore working out   [employer] Provided Include  Phase 7  [place] for  Change Teaching method utilized: Visual & Auditory  Demonstrated degree of understanding via: Teach Back  Readiness Level: action Barriers to learning/adherence to lifestyle change: non identified   RD's Notes for Next Visit Assess adherence to pt chosen goals    MONITORING & EVALUATION Dietary intake, weekly physical activity, body weight  Next Steps Patient is to follow-up in October

## 2022-01-03 ENCOUNTER — Other Ambulatory Visit: Payer: Self-pay | Admitting: Psychiatry

## 2022-01-03 ENCOUNTER — Telehealth: Payer: Self-pay | Admitting: *Deleted

## 2022-01-03 DIAGNOSIS — F3131 Bipolar disorder, current episode depressed, mild: Secondary | ICD-10-CM

## 2022-01-03 MED ORDER — LORAZEPAM 1 MG PO TABS: 1.0000 mg | ORAL_TABLET | Freq: Two times a day (BID) | ORAL | 0 refills | Status: AC | PRN

## 2022-01-03 MED ORDER — LITHIUM CARBONATE 300 MG PO CAPS: 300.0000 mg | ORAL_CAPSULE | Freq: Every day | ORAL | 0 refills | Status: DC

## 2022-01-03 NOTE — Telephone Encounter (Signed)
Called the patient.  She states that she has been awake through the night, and has been impulsive.  She talks about an episode of sending text message to his ex boyfriend, which she normally does not do.  She has intense anxiety.  Although she tried lorazepam which was prescribed in the past, it does not touch her at all.  She denies SI.  She denies feeling depressed.  She denies AH.  She has VH of seeing some peripheral vision; it occurred in the past.  She agrees with the following plans.  - Continue latuda 60 mg daily and other medication - Start lithium 300 mg at night.  Discussed risk of lithium toxicity, serotonin syndrome.  - Obtain labs five days after starting lithium (Lithium, BMP).  - She agrees to keep the follow up appointment next week.

## 2022-01-03 NOTE — Telephone Encounter (Signed)
Pt called--stated since increase medication for Rx Lurasidone HCl 60 MG TABS --manic episode, sleeping problem, and having thoughts in the head, 1 week. Please advise
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
## 2022-01-08 ENCOUNTER — Telehealth (INDEPENDENT_AMBULATORY_CARE_PROVIDER_SITE_OTHER): Admitting: Psychiatry

## 2022-01-08 ENCOUNTER — Encounter: Payer: Self-pay | Admitting: Psychiatry

## 2022-01-08 DIAGNOSIS — F3132 Bipolar disorder, current episode depressed, moderate: Secondary | ICD-10-CM | POA: Diagnosis not present

## 2022-01-08 DIAGNOSIS — F411 Generalized anxiety disorder: Secondary | ICD-10-CM

## 2022-01-08 MED ORDER — LURASIDONE HCL 60 MG PO TABS: 60.0000 mg | ORAL_TABLET | Freq: Every evening | ORAL | 0 refills | Status: DC

## 2022-01-08 MED ORDER — BUPROPION HCL 75 MG PO TABS: 75.0000 mg | ORAL_TABLET | Freq: Two times a day (BID) | ORAL | 0 refills | Status: DC

## 2022-01-09 ENCOUNTER — Encounter: Payer: Self-pay | Admitting: Psychiatry

## 2022-01-09 ENCOUNTER — Other Ambulatory Visit: Payer: Self-pay | Admitting: Psychiatry

## 2022-01-09 DIAGNOSIS — F3132 Bipolar disorder, current episode depressed, moderate: Secondary | ICD-10-CM

## 2022-01-09 LAB — BASIC METABOLIC PANEL
BUN/Creatinine Ratio: 11 (ref 9–23)
BUN: 11 mg/dL (ref 6–24)
CO2: 23 mmol/L (ref 20–29)
Calcium: 9.1 mg/dL (ref 8.7–10.2)
Chloride: 104 mmol/L (ref 96–106)
Creatinine, Ser: 1.03 mg/dL — ABNORMAL HIGH (ref 0.57–1.00)
Glucose: 144 mg/dL — ABNORMAL HIGH (ref 70–99)
Potassium: 4.2 mmol/L (ref 3.5–5.2)
Sodium: 142 mmol/L (ref 134–144)
eGFR: 67 mL/min/{1.73_m2} (ref >59)

## 2022-01-09 LAB — LITHIUM LEVEL: Lithium Lvl: 0.1 mmol/L — ABNORMAL LOW (ref 0.5–1.2)

## 2022-01-09 NOTE — Progress Notes (Signed)
Could you contact the patient and inform below.  - Lithium level is under therapeutic range. Please increase lithium to 600 mg at night (take two tabs of 300 mg).  - Please obtain blood test one week after increasing the dose. We will check lithium and kidney function.  - Creatinine is slightly higher than normal range. We will monitor this as well.
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
## 2022-01-22 ENCOUNTER — Telehealth (INDEPENDENT_AMBULATORY_CARE_PROVIDER_SITE_OTHER): Admitting: Psychiatry

## 2022-01-22 ENCOUNTER — Encounter: Payer: Self-pay | Admitting: Psychiatry

## 2022-01-22 DIAGNOSIS — F3131 Bipolar disorder, current episode depressed, mild: Secondary | ICD-10-CM

## 2022-01-22 DIAGNOSIS — F411 Generalized anxiety disorder: Secondary | ICD-10-CM

## 2022-01-22 MED ORDER — LITHIUM CARBONATE 600 MG PO CAPS: 600.0000 mg | ORAL_CAPSULE | Freq: Every day | ORAL | 0 refills | Status: DC

## 2022-01-22 MED ORDER — BUPROPION HCL 75 MG PO TABS: 75.0000 mg | ORAL_TABLET | Freq: Two times a day (BID) | ORAL | 0 refills | Status: DC

## 2022-01-22 NOTE — Patient Instructions (Addendum)
Continue fluoxetine 40 mg daily  Continue Latuda 60 mg daily  Continue bupropion 75 mg twice a day  Continue lithium 600 mg at night  Continue lorazepam 1 mg twice a day as needed for anxiety  Obtain labs (lithium, BMP) Next appointment: 8/23 at 8:30

## 2022-01-24 ENCOUNTER — Encounter: Payer: Self-pay | Admitting: Psychiatry

## 2022-01-24 ENCOUNTER — Other Ambulatory Visit: Payer: Self-pay | Admitting: Psychiatry

## 2022-01-24 DIAGNOSIS — F3131 Bipolar disorder, current episode depressed, mild: Secondary | ICD-10-CM

## 2022-01-24 LAB — BASIC METABOLIC PANEL
BUN/Creatinine Ratio: 13 (ref 9–23)
BUN: 14 mg/dL (ref 6–24)
CO2: 25 mmol/L (ref 20–29)
Calcium: 9 mg/dL (ref 8.7–10.2)
Chloride: 99 mmol/L (ref 96–106)
Creatinine, Ser: 1.07 mg/dL — ABNORMAL HIGH (ref 0.57–1.00)
Glucose: 83 mg/dL (ref 70–99)
Potassium: 4.1 mmol/L (ref 3.5–5.2)
Sodium: 138 mmol/L (ref 134–144)
eGFR: 64 mL/min/{1.73_m2} (ref >59)

## 2022-01-24 LAB — LITHIUM LEVEL: Lithium Lvl: 0.2 mmol/L — ABNORMAL LOW (ref 0.5–1.2)

## 2022-01-24 NOTE — Telephone Encounter (Signed)
Discussed with the patient regarding the blood test result.  She verbalized understanding to stay on the current dose of lithium.  Recheck blood test in 2 weeks to monitor kidney function.

## 2022-02-04 ENCOUNTER — Other Ambulatory Visit: Payer: Self-pay | Admitting: Psychiatry

## 2022-02-04 ENCOUNTER — Telehealth: Payer: Self-pay

## 2022-02-04 NOTE — Progress Notes (Signed)
Could you print out the orders (lithium, BMP), and fax to them or mail it to her? Thanks.

## 2022-02-04 NOTE — Telephone Encounter (Signed)
I talked with [facility] at [address] [place], [place] [address] (the one close to [place]). They did receive electronic order which was ordered on 7/28. They will be able to get a blood test if the patient comes in. Please advise the patient to have this test. Thanks.

## 2022-02-04 NOTE — Telephone Encounter (Signed)
Could you print out the orders (lithium, BMP), and fax to them or mail it to her? Thanks.

## 2022-02-04 NOTE — Telephone Encounter (Signed)
pt states she needs her labwork order on a [facility] form that [facility] does not use [employer] anymore so a form will need to be sent to the [facility] next to [facility].

## 2022-02-05 ENCOUNTER — Telehealth: Payer: Self-pay | Admitting: Psychiatry

## 2022-02-05 ENCOUNTER — Other Ambulatory Visit: Payer: Self-pay | Admitting: Psychiatry

## 2022-02-05 DIAGNOSIS — F3131 Bipolar disorder, current episode depressed, mild: Secondary | ICD-10-CM

## 2022-02-05 LAB — BASIC METABOLIC PANEL
BUN/Creatinine Ratio: 9 (ref 9–23)
BUN: 10 mg/dL (ref 6–24)
CO2: 24 mmol/L (ref 20–29)
Calcium: 9.3 mg/dL (ref 8.7–10.2)
Chloride: 101 mmol/L (ref 96–106)
Creatinine, Ser: 1.1 mg/dL — ABNORMAL HIGH (ref 0.57–1.00)
Glucose: 116 mg/dL — ABNORMAL HIGH (ref 70–99)
Potassium: 4.2 mmol/L (ref 3.5–5.2)
Sodium: 141 mmol/L (ref 134–144)
eGFR: 62 mL/min/{1.73_m2} (ref >59)

## 2022-02-05 LAB — LITHIUM LEVEL: Lithium Lvl: 0.3 mmol/L — ABNORMAL LOW (ref 0.5–1.2)

## 2022-02-05 NOTE — Telephone Encounter (Signed)
Discussed with the patient regarding the recent blood test. Creatinine continues to be elevated, likely due to lithium. She agrees with the following. -Discontinue lithium - Recheck lab/BMP in two weeks - Keep the next appointment in August.  She agrees to contact the office if any concerns in the meantime.
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
## 2022-02-19 ENCOUNTER — Encounter: Payer: Self-pay | Admitting: Psychiatry

## 2022-02-19 ENCOUNTER — Telehealth (INDEPENDENT_AMBULATORY_CARE_PROVIDER_SITE_OTHER): Admitting: Psychiatry

## 2022-02-19 DIAGNOSIS — F411 Generalized anxiety disorder: Secondary | ICD-10-CM | POA: Diagnosis not present

## 2022-02-19 DIAGNOSIS — F3175 Bipolar disorder, in partial remission, most recent episode depressed: Secondary | ICD-10-CM | POA: Diagnosis not present

## 2022-02-19 MED ORDER — FLUOXETINE HCL 40 MG PO CAPS: 40.0000 mg | ORAL_CAPSULE | Freq: Every day | ORAL | 1 refills | Status: DC

## 2022-02-19 MED ORDER — LURASIDONE HCL 40 MG PO TABS: 40.0000 mg | ORAL_TABLET | Freq: Two times a day (BID) | ORAL | 1 refills | Status: DC

## 2022-02-21 ENCOUNTER — Other Ambulatory Visit: Payer: Self-pay | Admitting: Psychiatry

## 2022-02-21 ENCOUNTER — Encounter: Payer: Self-pay | Admitting: Psychiatry

## 2022-02-21 DIAGNOSIS — F3175 Bipolar disorder, in partial remission, most recent episode depressed: Secondary | ICD-10-CM

## 2022-02-21 LAB — BASIC METABOLIC PANEL
BUN/Creatinine Ratio: 11 (ref 9–23)
BUN: 12 mg/dL (ref 6–24)
CO2: 22 mmol/L (ref 20–29)
Calcium: 9.1 mg/dL (ref 8.7–10.2)
Chloride: 102 mmol/L (ref 96–106)
Creatinine, Ser: 1.06 mg/dL — ABNORMAL HIGH (ref 0.57–1.00)
Glucose: 124 mg/dL — ABNORMAL HIGH (ref 70–99)
Potassium: 4.2 mmol/L (ref 3.5–5.2)
Sodium: 140 mmol/L (ref 134–144)
eGFR: 65 mL/min/{1.73_m2} (ref >59)

## 2022-02-21 IMAGING — CT CT ABD-PELV W/ CM
3 of 5 series · 16 of 46 positions shown, 18 images · IV contrast (agent unspecified)
Comparison: Ultrasound from earlier in the same day.

CLINICAL DATA: Acute abdominal pain, initial encounter

EXAM:
CT ABDOMEN AND PELVIS WITH CONTRAST
TECHNIQUE: Multidetector CT imaging of the abdomen and pelvis was performed
using the standard protocol following bolus administration of
intravenous contrast.

[Series 2: axial st · axial · 0.80mm/px · z∈[-452,-66]mm · 12 of 92 slices shown, 14 images]
[im 8/92  soft-tissue]
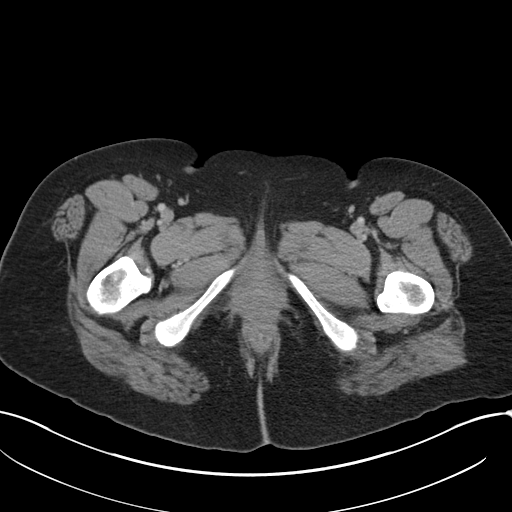
[im 8/92  bone]
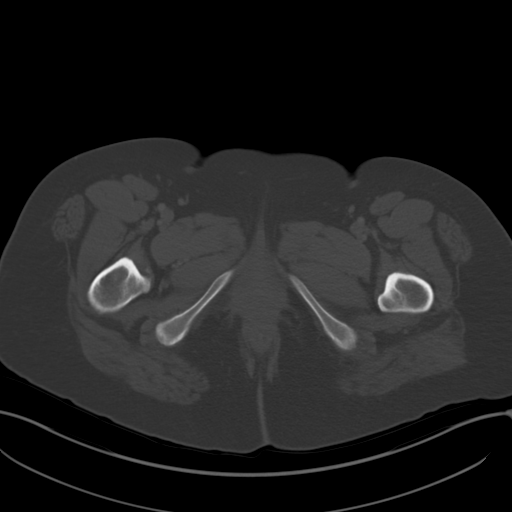
[im 15/92  soft-tissue]
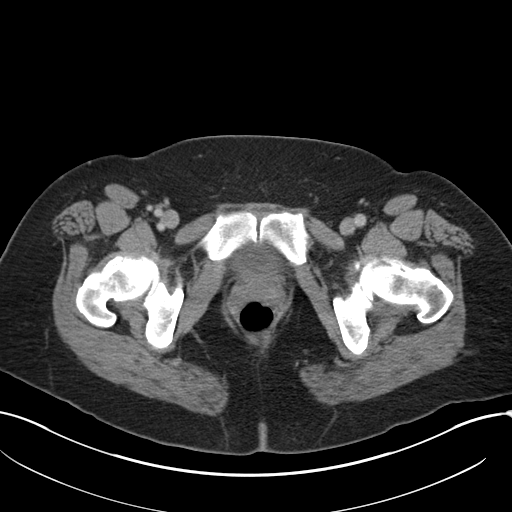
[im 22/92  soft-tissue]
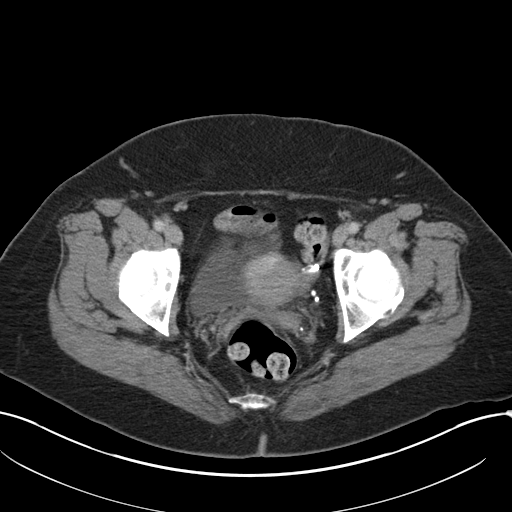
[im 29/92  soft-tissue]
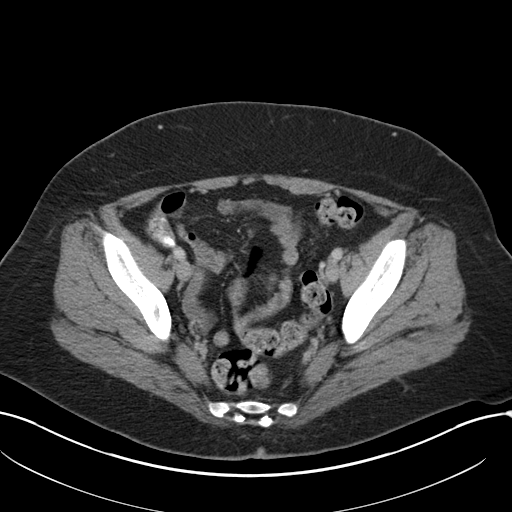
[im 36/92  soft-tissue]
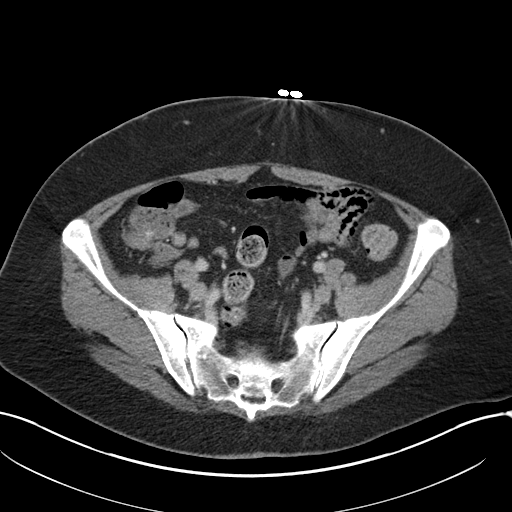
[im 43/92  soft-tissue]
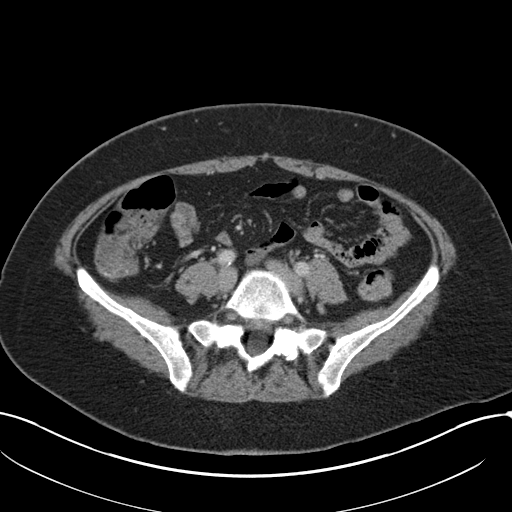
[im 50/92  soft-tissue]
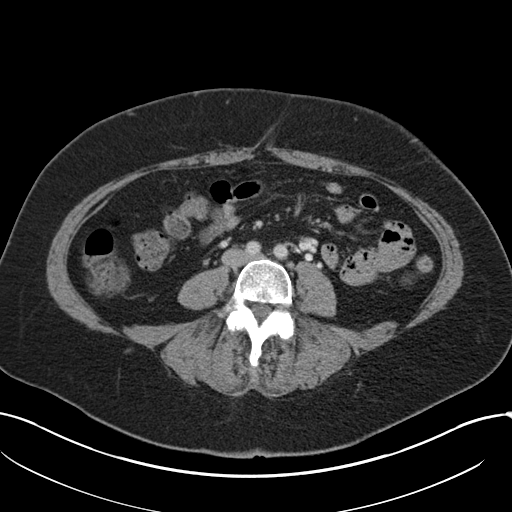
[im 57/92  soft-tissue]
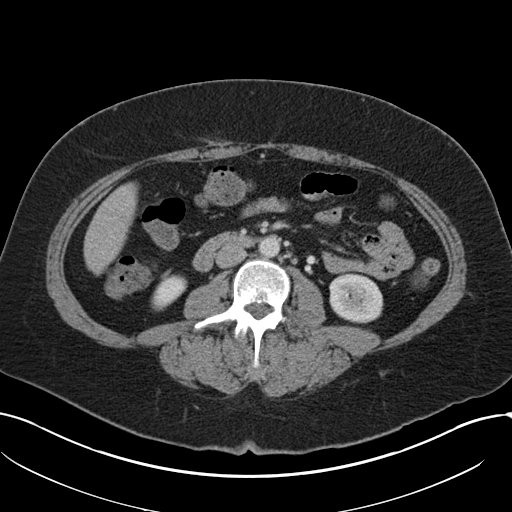
[im 64/92  soft-tissue]
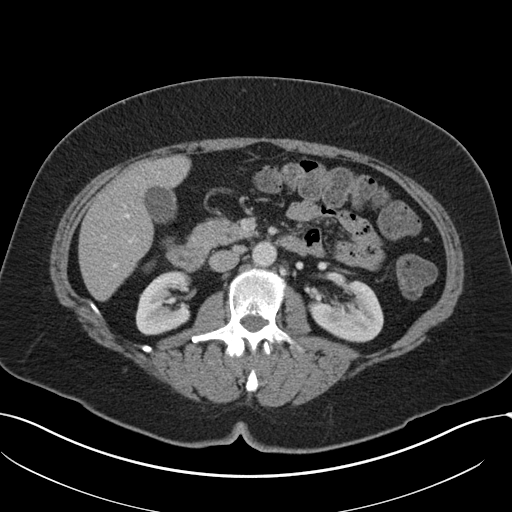
[im 64/92  bone]
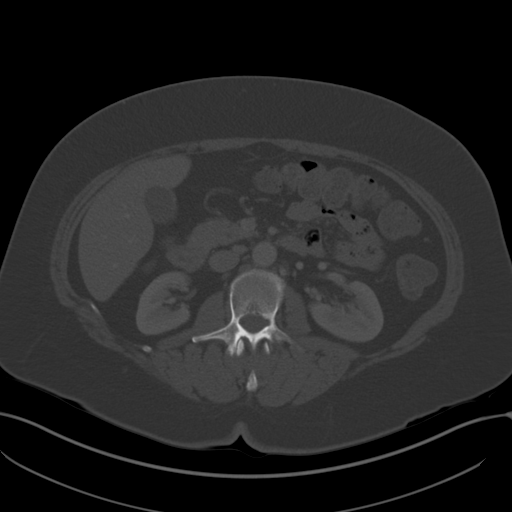
[im 71/92  soft-tissue]
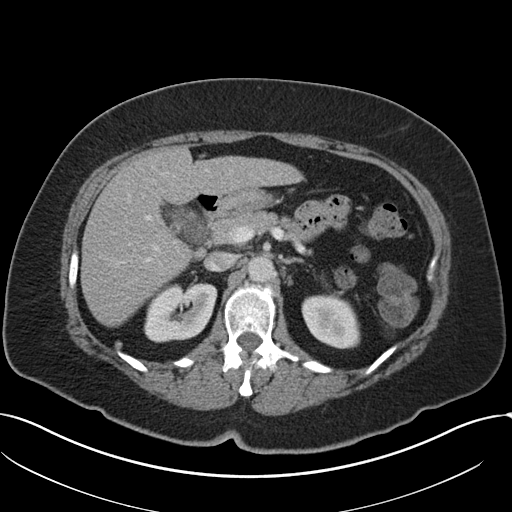
[im 78/92  soft-tissue]
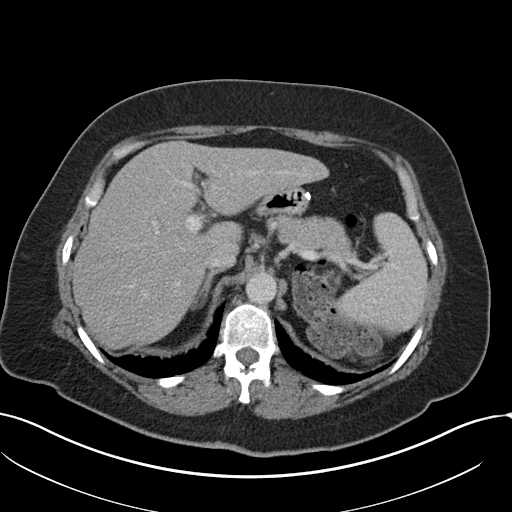
[im 85/92  soft-tissue]
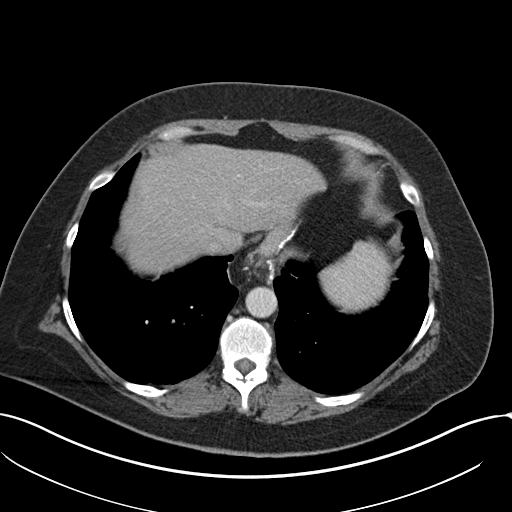

[Series 4: coronal st · coronal · 0.92mm/px · 3 of 159 slices shown]
[im 53/159  soft-tissue]
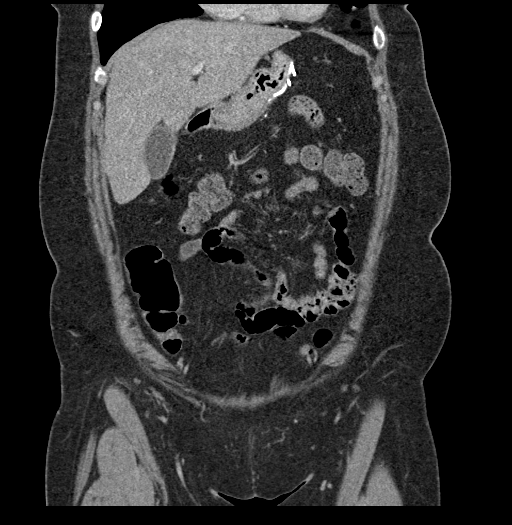
[im 71/159  soft-tissue]
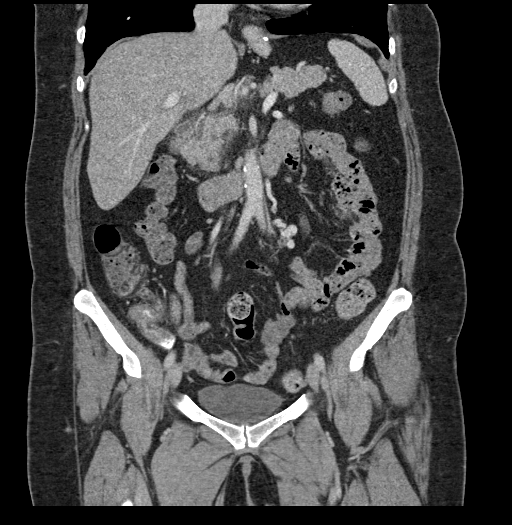
[im 88/159  soft-tissue]
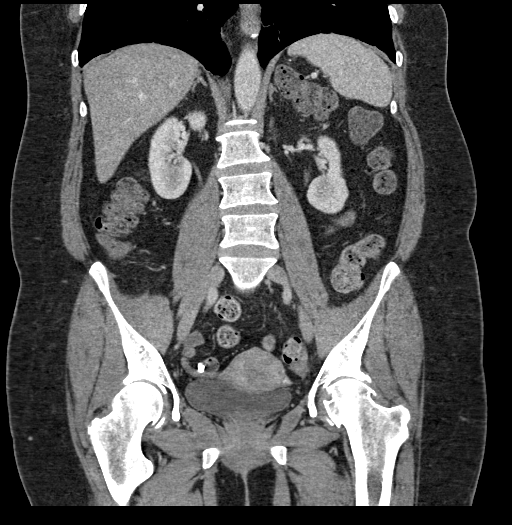

[Series 6: lung bases · axial · 0.80mm/px · 1 of 81 slices shown]
[im 8/81  bone]
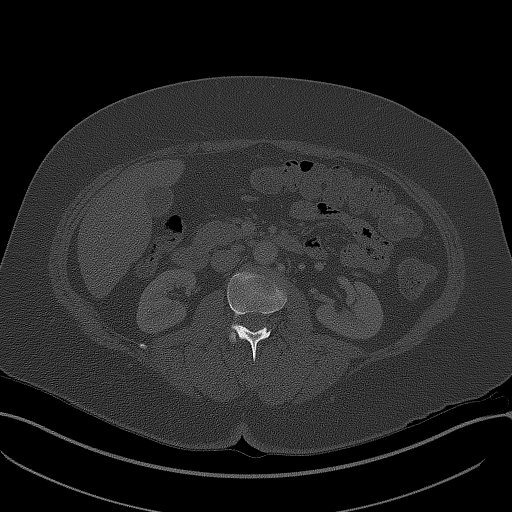

[16 of 46 positions shown; findings below may reference images not displayed]

RADIATION DOSE REDUCTION: This exam was performed according to the
departmental dose-optimization program which includes automated
exposure control, adjustment of the mA and/or kV according to
patient size and/or use of iterative reconstruction technique.

CONTRAST:  100mL OMNIPAQUE IOHEXOL 300 MG/ML  SOLN
FINDINGS: Lower chest: Lung bases are free of acute infiltrate or sizable
effusion. Minimal scarring is noted.

Hepatobiliary: No focal liver abnormality is seen. No gallstones,
gallbladder wall thickening, or biliary dilatation. The known
cholelithiasis is not well appreciated on this exam.

Pancreas: Unremarkable. No pancreatic ductal dilatation or
surrounding inflammatory changes.

Spleen: Normal in size without focal abnormality.

Adrenals/Urinary Tract: Adrenal glands are within normal limits.
Kidneys demonstrate a normal enhancement pattern bilaterally. No
renal calculi or obstructive changes are seen. The bladder is well
distended.

Stomach/Bowel: Appendix is within normal limits. No obstructive or
inflammatory changes of the colon are noted. Small bowel is within
normal limits. Changes of prior sleeve gastrectomy are noted.

Vascular/Lymphatic: Aortic atherosclerosis. No enlarged abdominal or
pelvic lymph nodes.

Reproductive: Uterus and bilateral adnexa are unremarkable. Tubal
ligation changes are noted.

Other: No abdominal wall hernia or abnormality. No abdominopelvic
ascites.

Musculoskeletal: Mild sclerosis about the sacroiliac joints
bilaterally. Degenerative changes of lumbar spine are seen.
IMPRESSION: No acute abnormality noted. Known cholelithiasis is not well
appreciated on this exam.

## 2022-02-21 NOTE — Telephone Encounter (Signed)
Ordered BMP

## 2022-02-21 NOTE — Progress Notes (Signed)
Could you contact the patient. Although creatinine is slightly higher than normal range, it has been trending down since discontinuation of lithium. I would recommend she repeats blood test in a month to monitor this. Order has bene sent to [facility].

## 2022-03-12 ENCOUNTER — Telehealth: Payer: Self-pay | Admitting: *Deleted

## 2022-03-12 ENCOUNTER — Other Ambulatory Visit: Payer: Self-pay | Admitting: Psychiatry

## 2022-03-12 MED ORDER — LURASIDONE HCL 20 MG PO TABS: 20.0000 mg | ORAL_TABLET | Freq: Every day | ORAL | 0 refills | Status: DC

## 2022-03-12 NOTE — Telephone Encounter (Signed)
Patient does need refill LATUDA 20 mg

## 2022-03-12 NOTE — Telephone Encounter (Signed)
Patient called stated that the higher dose of Latuda is causing side effects & she would like to go back to previous dosage

## 2022-03-12 NOTE — Telephone Encounter (Signed)
Patient does require refill on 20 mg LATUDA

## 2022-03-12 NOTE — Telephone Encounter (Signed)
ordered

## 2022-03-12 NOTE — Telephone Encounter (Signed)
Please advise her to lower the dose to 20 mg daily, and advise her to contact the office if side effect does not improve and/or if any worsening in her mood symptoms. Let me know if she needs a refill.

## 2022-03-12 NOTE — Telephone Encounter (Signed)
Ordered
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
## 2022-04-02 ENCOUNTER — Telehealth (INDEPENDENT_AMBULATORY_CARE_PROVIDER_SITE_OTHER): Admitting: Psychiatry

## 2022-04-02 ENCOUNTER — Ambulatory Visit: Admitting: Skilled Nursing Facility1

## 2022-04-02 ENCOUNTER — Encounter: Payer: Self-pay | Admitting: Psychiatry

## 2022-04-02 DIAGNOSIS — F3175 Bipolar disorder, in partial remission, most recent episode depressed: Secondary | ICD-10-CM

## 2022-04-02 DIAGNOSIS — F411 Generalized anxiety disorder: Secondary | ICD-10-CM

## 2022-04-12 ENCOUNTER — Other Ambulatory Visit: Payer: Self-pay | Admitting: Psychiatry

## 2022-04-15 ENCOUNTER — Encounter: Payer: Self-pay | Admitting: Dietician

## 2022-04-15 ENCOUNTER — Encounter: Attending: General Surgery | Admitting: Dietician

## 2022-04-15 DIAGNOSIS — E669 Obesity, unspecified: Secondary | ICD-10-CM | POA: Diagnosis not present

## 2022-04-15 NOTE — Progress Notes (Signed)
Bariatric Nutrition Follow-Up Visit Medical Nutrition Therapy    NUTRITION ASSESSMENT   Surgery date: 04/23/2021 Surgery type: sleeve Start weight at [facility]: 253 Weight today (04/15/2022): Pt did not want to weigh, but self reports 200 pounds   Body Composition Scale Date 06/12/22 Date 09/25/2021 12/25/2021  Current Body Weight 232.8 207.8 203.8  Total Body Fat % 42.4 39.2 38.8  Visceral Fat 13 11 11   Fat-Free Mass % 57.5 60.7 61.1   Total Body Water % 43.2 44.8 45  Muscle-Mass lbs 32.8 32.6 32.4  BMI 37.5 33.4 32.8  Body Fat Displacement            Torso  lbs 61.3 50.5 48.9         Left Leg  lbs 12.2 10.1 9.7         Right Leg  lbs 12.2 10.1 9.7         Left Arm  lbs 6.1 5.0 4.8         Right Arm   lbs 6.1 5.0 4.8   Clinical  Medical hx: Bipolar, Epstein barr infection,  lyme disease, rocky mountain spotted fever, HTN, fibromyalgia  Medications: Advair, albuterol, motrin, Latuda, Prozac, lorazepam   Labs: glucose 124 (pt states not fasting); creatinine 1.06 Notable signs/symptoms: hip and lower back pain Any previous deficiencies? No   Lifestyle & Dietary Hx  Pt states she is doing better about taking her multivitamin and calcium, stating for a while she  Pt states she has a new stressful job, as ).  No time for snacking. Pt states she has been sliding back into more convenience foods Pt self reports weight at about 200, stating she has not lost or gained weight. Pt states after surgery she can not stand to eat left overs, stating if it has been in the refrigerator any length of time, she gets grossed out.  Estimated daily fluid intake: not really sure but thinking over 64 ounces oz Estimated daily protein intake: states she has not been tracking her protein, and not sure what she is getting. Supplements: multi and calcium  Current average weekly physical activity: ADL's; feels guilty to leave her husband to go to the gym  24-Hr Dietary Recall First  Meal 7:30: coffee, zero sugar creamer, zero sugar syrup, sausage biscuit and hash Keyuna Cuthrell Snack 9:00: rest of hash Christoher Drudge Second Meal 11:30:  Snack 2pm: gronola kodiak bar Third Meal: eaten out: fried pickles  + fish taco Snack:  Beverages: suagr free coffee,   Post-Op Goals/ Signs/ Symptoms Using straws: no Drinking while eating: no Chewing/swallowing difficulties: no Changes in vision: no Changes to mood/headaches: no Hair loss/changes to skin/nails: no Difficulty focusing/concentrating: no Sweating: no Limb weakness: no Dizziness/lightheadedness: no Palpitations: no  Carbonated/caffeinated beverages: no N/V/D/C/Gas: no Abdominal pain: no Dumping syndrome: no    NUTRITION DIAGNOSIS  Overweight/obesity ([place]-3.3) related to past poor dietary habits and physical inactivity as evidenced by completed bariatric surgery and following dietary guidelines for continued weight loss and healthy nutrition status.     NUTRITION INTERVENTION Nutrition counseling (C-1) and education (E-2) to facilitate bariatric surgery goals, including: The importance of consuming adequate calories as well as certain nutrients daily due to the body's need for essential vitamins, minerals, and fats Purpose of protein: Every cell in your body has protein. Protein is essential for the structure, function and regulation of tissues and organs within the body. Without protein enzymes and antibodies would not exist, and cells would lack storage, transportation, and messenger  systems. According to [facility] [place], the body is made up of at least 10000 different proteins. Lack of protein can lead to growth failure in children, loss of muscle mass, decreased immune system function, and overall weakening of various organs in the body.  [email or web address], [email or web address],  [email or web address]  Why you need complex carbohydrates: Whole grains and other complex carbohydrates are required to have a healthy diet. Whole grains provide fiber which can help with blood glucose levels and help keep you satiated. Fruits and starchy vegetables provide essential vitamins and minerals required for immune function, eyesight support, brain support, bone density, wound healing and many other functions within the body. According to the current evidenced based 2020-2025 Dietary Guidelines for Americans, complex carbohydrates are part of a healthy eating pattern which is associated with a decreased risk for type 2 diabetes, cancers, and cardiovascular disease.  The importance of daily physical activity and to reach a goal of at least 150 minutes of moderate to vigorous physical activity weekly (or as directed by their physician) due to benefits such as increased musculature and improved lab values The importance of intuitive eating specifically learning hunger-satiety cues and understanding the importance of learning a new body: The importance of mindful eating to avoid grazing behaviors   Goals: -get back to the basics with bariatric MyPlate and meal ideas handouts.  Eat less fast food, convenience foods. Prepare easy quick breakfasts and lunch meals -track protein -eat at scheduled meal and snack times, getting away from your desk at work.  Handouts Provided Include  Meal Ideas Handout Bariatric MyPlate handout  Learning Style & Readiness for Change Teaching method utilized: Visual & Auditory  Demonstrated degree of understanding via: Teach Back  Readiness Level: action Barriers to learning/adherence to lifestyle change: non identified   RD's Notes for Next Visit Assess adherence to pt chosen goals    MONITORING & EVALUATION Dietary intake, weekly physical activity, body weight  Next Steps Patient is to follow-up in  January
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
## 2022-04-30 ENCOUNTER — Telehealth (INDEPENDENT_AMBULATORY_CARE_PROVIDER_SITE_OTHER): Admitting: Psychiatry

## 2022-04-30 ENCOUNTER — Encounter: Payer: Self-pay | Admitting: Psychiatry

## 2022-04-30 DIAGNOSIS — F411 Generalized anxiety disorder: Secondary | ICD-10-CM | POA: Diagnosis not present

## 2022-04-30 DIAGNOSIS — F3132 Bipolar disorder, current episode depressed, moderate: Secondary | ICD-10-CM | POA: Diagnosis not present

## 2022-04-30 MED ORDER — LURASIDONE HCL 20 MG PO TABS: 20.0000 mg | ORAL_TABLET | Freq: Every day | ORAL | 0 refills | Status: DC

## 2022-04-30 MED ORDER — BUPROPION HCL 75 MG PO TABS: 75.0000 mg | ORAL_TABLET | Freq: Two times a day (BID) | ORAL | 0 refills | Status: DC

## 2022-04-30 NOTE — Patient Instructions (Signed)
Continue fluoxetine 40 mg daily  Continue Latuda 20 mg daily Continue bupropion 75 mg twice a day  Obtain labs (BMP) to monitor kidney function  Continue lorazepam 1 mg twice a day as needed for anxiety  Next appointment: 12/13 at 3 PM

## 2022-05-01 LAB — BASIC METABOLIC PANEL
BUN/Creatinine Ratio: 8 — ABNORMAL LOW (ref 9–23)
BUN: 8 mg/dL (ref 6–24)
CO2: 25 mmol/L (ref 20–29)
Calcium: 9.3 mg/dL (ref 8.7–10.2)
Chloride: 101 mmol/L (ref 96–106)
Creatinine, Ser: 1.01 mg/dL — ABNORMAL HIGH (ref 0.57–1.00)
Glucose: 141 mg/dL — ABNORMAL HIGH (ref 70–99)
Potassium: 4.4 mmol/L (ref 3.5–5.2)
Sodium: 139 mmol/L (ref 134–144)
eGFR: 69 mL/min/{1.73_m2} (ref >59)

## 2022-05-01 NOTE — Progress Notes (Signed)
Please contact her and notify the blood test result. Although creatinine (which indicates kidney function) is slightly higher than normal range, it has been improving compared to the last result. Will plan to recheck it again in a few months.

## 2022-05-01 NOTE — Progress Notes (Signed)
Called pt to make aware of lab results she voiced understanding

## 2022-05-29 ENCOUNTER — Other Ambulatory Visit: Payer: Self-pay | Admitting: Psychiatry

## 2022-05-29 DIAGNOSIS — F3175 Bipolar disorder, in partial remission, most recent episode depressed: Secondary | ICD-10-CM

## 2022-06-06 ENCOUNTER — Telehealth: Payer: Self-pay

## 2022-06-06 NOTE — Telephone Encounter (Signed)
pt callled left message that she was having medication issues.

## 2022-06-06 NOTE — Telephone Encounter (Signed)
left message for patient to call office back so we can get more information about how she feels
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
## 2022-06-11 ENCOUNTER — Telehealth (INDEPENDENT_AMBULATORY_CARE_PROVIDER_SITE_OTHER): Admitting: Psychiatry

## 2022-06-11 ENCOUNTER — Encounter: Payer: Self-pay | Admitting: Psychiatry

## 2022-06-11 DIAGNOSIS — F3181 Bipolar II disorder: Secondary | ICD-10-CM

## 2022-06-11 DIAGNOSIS — F411 Generalized anxiety disorder: Secondary | ICD-10-CM

## 2022-06-11 NOTE — Patient Instructions (Signed)
Continue fluoxetine 40 mg daily  Continue bupropion 75 mg twice a day  Obtain EKG (she will ask her PCP to do this) Start Varaylar 1.5 mg  after obtaining EKG, and discontinue latuda  Continue lorazepam 1 mg twice a day as needed for anxiety  Next appointment: 1/10 at 2:30

## 2022-06-12 ENCOUNTER — Other Ambulatory Visit: Payer: Self-pay | Admitting: Psychiatry

## 2022-06-12 MED ORDER — CARIPRAZINE HCL 1.5 MG PO CAPS: 1.5000 mg | ORAL_CAPSULE | Freq: Every day | ORAL | 0 refills | Status: DC

## 2022-06-12 NOTE — Telephone Encounter (Signed)
Reviewed EKG. QTc 407 msec on 06/12/2022. Could you notify the patient that Sharon Merritt is ordered to the pharmacy in replace of latuda as discussed at her visit? Thanks.

## 2022-06-30 ENCOUNTER — Encounter: Payer: Self-pay | Admitting: Psychiatry

## 2022-06-30 NOTE — Telephone Encounter (Signed)
This encounter was created in error - please disregard.
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
## 2022-07-09 ENCOUNTER — Encounter: Payer: Self-pay | Admitting: Psychiatry

## 2022-07-09 ENCOUNTER — Telehealth (INDEPENDENT_AMBULATORY_CARE_PROVIDER_SITE_OTHER): Admitting: Psychiatry

## 2022-07-09 DIAGNOSIS — F411 Generalized anxiety disorder: Secondary | ICD-10-CM

## 2022-07-09 DIAGNOSIS — F3181 Bipolar II disorder: Secondary | ICD-10-CM | POA: Diagnosis not present

## 2022-07-09 MED ORDER — CARIPRAZINE HCL 1.5 MG PO CAPS: 1.5000 mg | ORAL_CAPSULE | Freq: Every day | ORAL | 0 refills | Status: DC

## 2022-07-09 NOTE — Patient Instructions (Signed)
Continue fluoxetine 40 mg daily  Continue bupropion 75 mg twice a day  Continue Varylar 1.5 mg  Continue lorazepam 1 mg twice a day as needed for anxiety  Next appointment: 2/7 at 3 PM

## 2022-07-17 ENCOUNTER — Ambulatory Visit: Admitting: Dietician

## 2022-07-22 ENCOUNTER — Encounter: Payer: Self-pay | Admitting: Dietician

## 2022-07-22 ENCOUNTER — Encounter: Attending: General Surgery | Admitting: Dietician

## 2022-07-22 VITALS — Ht 65.0 in | Wt 208.5 lb

## 2022-07-22 DIAGNOSIS — E669 Obesity, unspecified: Secondary | ICD-10-CM | POA: Insufficient documentation

## 2022-07-22 NOTE — Progress Notes (Signed)
Bariatric Nutrition Follow-Up Visit Medical Nutrition Therapy   NUTRITION ASSESSMENT   Surgery date: 04/23/2021 Surgery type: sleeve Height: 65 in Start weight at [facility]: 253 Weight today (04/15/2022): 208.5 lb   Body Composition Scale Date 06/12/22 09/25/2021 12/25/2021 07/22/2022  Current Body Weight 232.8 207.8 203.8 208.5  Total Body Fat % 42.4 39.2 38.8 39.5  Visceral Fat 13 11 11 11   Fat-Free Mass % 57.5 60.7 61.1 60.4   Total Body Water % 43.2 44.8 45 44.7  Muscle-Mass lbs 32.8 32.6 32.4 32.4  BMI 37.5 33.4 32.8 33.5  Body Fat Displacement             Torso  lbs 61.3 50.5 48.9 51.0         Left Leg  lbs 12.2 10.1 9.7 10.2         Right Leg  lbs 12.2 10.1 9.7 10.2         Left Arm  lbs 6.1 5.0 4.8 5.1         Right Arm   lbs 6.1 5.0 4.8 5.1   Clinical  Medical hx: Bipolar, Epstein barr infection,  lyme disease, rocky mountain spotted fever, HTN, fibromyalgia  Medications: Advair, albuterol, motrin, Latuda, Prozac, lorazepam, vraylar   Labs: glucose 141 (pt states not fasting); creatinine 1.01 Notable signs/symptoms: hip and lower back pain Any previous deficiencies? No   Lifestyle & Dietary Hx  Pt states work has calmed down, stating she has time to eat at scheduled times. Pt states she got some triscuits, to satisfy her need for a crunch. Pt states they are about to call in hospice for her husband. Pt states she doesn't do vegetables much now, stating she wastes so much of it since her husband doesn't eat much now and she is the only one to eat. Pt states she does not like leftover vegetables.  Estimated daily fluid intake: over 64 ounces oz Estimated daily protein intake: states she has not been tracking her protein, and not sure what she is getting. Supplements: multi and calcium  Current average weekly physical activity: ADL's; feels guilty to leave her husband to go to the gym  24-Hr Dietary Recall First Meal 7:30: orange juice, hash Lacrecia Delval, eggs, sausage Snack  9:00: rest of hash Janelie Goltz Second Meal 11:30: pizza (part of a slice)  Snack 2pm: gronola kodiak bar Third Meal: eaten out: [employer] eggs or [employer] salad, rotissory chicken Snack:  Beverages: suagr free coffee, water  Post-Op Goals/ Signs/ Symptoms Using straws: no Drinking while eating: no Chewing/swallowing difficulties: no Changes in vision: no Changes to mood/headaches: no Hair loss/changes to skin/nails: no Difficulty focusing/concentrating: no Sweating: no Limb weakness: no Dizziness/lightheadedness: no Palpitations: no  Carbonated/caffeinated beverages: no N/V/D/C/Gas: no Abdominal pain: no Dumping syndrome: no    NUTRITION DIAGNOSIS  Overweight/obesity (Wirt-3.3) related to past poor dietary habits and physical inactivity as evidenced by completed bariatric surgery and following dietary guidelines for continued weight loss and healthy nutrition status.     NUTRITION INTERVENTION Nutrition counseling (C-1) and education (E-2) to facilitate bariatric surgery goals, including: The importance of consuming adequate calories as well as certain nutrients daily due to the body's need for essential vitamins, minerals, and fats Purpose of protein: Every cell in your body has protein. Protein is essential for the structure, function and regulation of tissues and organs within the body. Without protein enzymes and antibodies would not exist, and cells would lack storage, transportation, and messenger systems. According to [facility]. [employer]  of Public Health, the body is made up of at least 10000 different proteins. Lack of protein can lead to growth failure in children, loss of muscle mass, decreased immune system function, and overall weakening of various organs in the body.  [email or web address], [email or web address], [email or web address]  Why you need complex  carbohydrates: Whole grains and other complex carbohydrates are required to have a healthy diet. Whole grains provide fiber which can help with blood glucose levels and help keep you satiated. Fruits and starchy vegetables provide essential vitamins and minerals required for immune function, eyesight support, brain support, bone density, wound healing and many other functions within the body. According to the current evidenced based 2020-2025 Dietary Guidelines for Americans, complex carbohydrates are part of a healthy eating pattern which is associated with a decreased risk for type 2 diabetes, cancers, and cardiovascular disease.  The importance of daily physical activity and to reach a goal of at least 150 minutes of moderate to vigorous physical activity weekly (or as directed by their physician) due to benefits such as increased musculature and improved lab values The importance of intuitive eating specifically learning hunger-satiety cues and understanding the importance of learning a new body: The importance of mindful eating to avoid grazing behaviors   Goals: -Continue: get back to the basics with bariatric MyPlate and meal ideas handouts.  Eat less fast food, convenience foods. Prepare easy quick breakfasts and lunch meals -Re-engage/continue: track protein -eat at scheduled meal and snack times, getting away from your desk at work. -New: Include more non-starchy vegetables. only cook the amount of vegetables that you will eat. Use frozen vegetables and only get out what you need. -New: go for walks at home, while caring for your husband.  Handouts Provided Include  Benefits of Physical Activity Protein foods list with amounts of protein  Learning Style & Readiness for Change Teaching method utilized: Visual & Auditory  Demonstrated degree of understanding via: Teach Back  Readiness Level: action Barriers to learning/adherence to lifestyle change: non identified   RD's Notes for Next  Visit Assess adherence to pt chosen goals   MONITORING & EVALUATION Dietary intake, weekly physical activity, body weight  Next Steps Patient is to follow-up in May
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
## 2022-08-06 ENCOUNTER — Telehealth (INDEPENDENT_AMBULATORY_CARE_PROVIDER_SITE_OTHER): Admitting: Psychiatry

## 2022-08-06 ENCOUNTER — Encounter: Payer: Self-pay | Admitting: Psychiatry

## 2022-08-06 DIAGNOSIS — F411 Generalized anxiety disorder: Secondary | ICD-10-CM

## 2022-08-06 DIAGNOSIS — F3181 Bipolar II disorder: Secondary | ICD-10-CM | POA: Diagnosis not present

## 2022-08-06 MED ORDER — BUPROPION HCL ER (SR) 100 MG PO TB12: 100.0000 mg | ORAL_TABLET | Freq: Two times a day (BID) | ORAL | 1 refills | Status: DC

## 2022-08-06 MED ORDER — CARIPRAZINE HCL 1.5 MG PO CAPS: 1.5000 mg | ORAL_CAPSULE | Freq: Every day | ORAL | 0 refills | Status: DC

## 2022-08-06 MED ORDER — FLUOXETINE HCL 40 MG PO CAPS: 40.0000 mg | ORAL_CAPSULE | Freq: Every day | ORAL | 1 refills | Status: DC

## 2022-08-06 NOTE — Patient Instructions (Signed)
Continue fluoxetine 40 mg daily  Continue bupropion 75 mg twice a day  Continue Vraylar 1.5 mg  Continue lorazepam 1 mg twice a day as needed for anxiety  Next appointment: 3/27 at 2:30

## 2022-08-28 ENCOUNTER — Encounter: Payer: Self-pay | Admitting: Radiology
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
## 2022-09-24 ENCOUNTER — Telehealth (INDEPENDENT_AMBULATORY_CARE_PROVIDER_SITE_OTHER): Admitting: Psychiatry

## 2022-09-24 ENCOUNTER — Encounter: Payer: Self-pay | Admitting: Psychiatry

## 2022-09-24 DIAGNOSIS — F3181 Bipolar II disorder: Secondary | ICD-10-CM | POA: Diagnosis not present

## 2022-09-24 MED ORDER — BUPROPION HCL ER (SR) 100 MG PO TB12: 100.0000 mg | ORAL_TABLET | Freq: Two times a day (BID) | ORAL | 0 refills | Status: DC

## 2022-09-24 NOTE — Patient Instructions (Signed)
Continue fluoxetine 40 mg daily  Continue bupropion 100 mg twice a day  Continue Vraylar 1.5 mg  Continue lorazepam 1 mg twice a day as needed for anxiety  Next appointment:  4/24 at 1 30 , video
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
## 2022-10-22 ENCOUNTER — Encounter: Payer: Self-pay | Admitting: Psychiatry

## 2022-10-22 ENCOUNTER — Telehealth (INDEPENDENT_AMBULATORY_CARE_PROVIDER_SITE_OTHER): Admitting: Psychiatry

## 2022-10-22 DIAGNOSIS — F411 Generalized anxiety disorder: Secondary | ICD-10-CM

## 2022-10-22 DIAGNOSIS — F3181 Bipolar II disorder: Secondary | ICD-10-CM

## 2022-10-22 MED ORDER — BUPROPION HCL ER (SR) 100 MG PO TB12: 100.0000 mg | ORAL_TABLET | Freq: Two times a day (BID) | ORAL | 0 refills | Status: DC

## 2022-10-22 MED ORDER — CARIPRAZINE HCL 1.5 MG PO CAPS: 1.5000 mg | ORAL_CAPSULE | Freq: Every day | ORAL | 0 refills | Status: DC

## 2022-10-22 NOTE — Patient Instructions (Signed)
Continue fluoxetine 40 mg daily  Continue bupropion 100 mg twice a day  Continue Vraylar 1.5 mg Continue lorazepam 1 mg twice a day as needed for anxiety  Next appointment:  6/5 at 8 am

## 2022-10-29 ENCOUNTER — Encounter: Payer: Self-pay | Admitting: Psychiatry

## 2022-10-29 LAB — BASIC METABOLIC PANEL
BUN/Creatinine Ratio: 9 (ref 9–23)
BUN: 8 mg/dL (ref 6–24)
CO2: 22 mmol/L (ref 20–29)
Calcium: 9.5 mg/dL (ref 8.7–10.2)
Chloride: 102 mmol/L (ref 96–106)
Creatinine, Ser: 0.92 mg/dL (ref 0.57–1.00)
Glucose: 94 mg/dL (ref 70–99)
Potassium: 4 mmol/L (ref 3.5–5.2)
Sodium: 143 mmol/L (ref 134–144)
eGFR: 76 mL/min/{1.73_m2} (ref >59)

## 2022-10-30 ENCOUNTER — Ambulatory Visit: Admitting: Dietician

## 2022-11-21 ENCOUNTER — Encounter (HOSPITAL_COMMUNITY): Payer: Self-pay | Admitting: *Deleted
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
## 2022-12-03 ENCOUNTER — Encounter: Payer: Self-pay | Admitting: Psychiatry

## 2022-12-03 ENCOUNTER — Telehealth (INDEPENDENT_AMBULATORY_CARE_PROVIDER_SITE_OTHER): Admitting: Psychiatry

## 2022-12-03 DIAGNOSIS — F411 Generalized anxiety disorder: Secondary | ICD-10-CM | POA: Diagnosis not present

## 2022-12-03 DIAGNOSIS — F3181 Bipolar II disorder: Secondary | ICD-10-CM | POA: Diagnosis not present

## 2022-12-03 MED ORDER — ZIPRASIDONE HCL 20 MG PO CAPS: 20.0000 mg | ORAL_CAPSULE | Freq: Every day | ORAL | 0 refills | Status: DC

## 2022-12-03 MED ORDER — LORAZEPAM 1 MG PO TABS: 1.0000 mg | ORAL_TABLET | Freq: Every day | ORAL | 0 refills | Status: AC | PRN
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
## 2023-01-02 ENCOUNTER — Telehealth (INDEPENDENT_AMBULATORY_CARE_PROVIDER_SITE_OTHER): Admitting: Psychiatry

## 2023-01-02 ENCOUNTER — Encounter: Payer: Self-pay | Admitting: Psychiatry

## 2023-01-02 DIAGNOSIS — F411 Generalized anxiety disorder: Secondary | ICD-10-CM | POA: Diagnosis not present

## 2023-01-02 DIAGNOSIS — R29818 Other symptoms and signs involving the nervous system: Secondary | ICD-10-CM

## 2023-01-02 DIAGNOSIS — F3181 Bipolar II disorder: Secondary | ICD-10-CM | POA: Diagnosis not present

## 2023-01-02 MED ORDER — BUPROPION HCL ER (SR) 100 MG PO TB12: 100.0000 mg | ORAL_TABLET | Freq: Two times a day (BID) | ORAL | 0 refills | Status: DC

## 2023-01-10 ENCOUNTER — Other Ambulatory Visit: Payer: Self-pay | Admitting: Psychiatry

## 2023-01-10 DIAGNOSIS — F3181 Bipolar II disorder: Secondary | ICD-10-CM

## 2023-01-10 LAB — CBC
Hematocrit: 43 % (ref 34.0–46.6)
Hemoglobin: 14.6 g/dL (ref 11.1–15.9)
MCH: 32.1 pg (ref 26.6–33.0)
MCHC: 34 g/dL (ref 31.5–35.7)
MCV: 95 fL (ref 79–97)
Platelets: 232 10*3/uL (ref 150–450)
RBC: 4.55 x10E6/uL (ref 3.77–5.28)
RDW: 11.9 % (ref 11.7–15.4)
WBC: 3.9 10*3/uL (ref 3.4–10.8)

## 2023-01-10 LAB — COMPREHENSIVE METABOLIC PANEL
ALT: 19 IU/L (ref 0–32)
AST: 22 IU/L (ref 0–40)
Albumin: 4.3 g/dL (ref 3.9–4.9)
Alkaline Phosphatase: 75 IU/L (ref 44–121)
BUN/Creatinine Ratio: 7 — ABNORMAL LOW (ref 9–23)
BUN: 7 mg/dL (ref 6–24)
Bilirubin Total: 0.7 mg/dL (ref 0.0–1.2)
CO2: 25 mmol/L (ref 20–29)
Calcium: 9.6 mg/dL (ref 8.7–10.2)
Chloride: 100 mmol/L (ref 96–106)
Creatinine, Ser: 0.96 mg/dL (ref 0.57–1.00)
Globulin, Total: 2.4 g/dL (ref 1.5–4.5)
Glucose: 87 mg/dL (ref 70–99)
Potassium: 4.6 mmol/L (ref 3.5–5.2)
Sodium: 139 mmol/L (ref 134–144)
Total Protein: 6.7 g/dL (ref 6.0–8.5)
eGFR: 73 mL/min/{1.73_m2} (ref >59)

## 2023-01-10 MED ORDER — DIVALPROEX SODIUM ER 500 MG PO TB24: 500.0000 mg | ORAL_TABLET | Freq: Every day | ORAL | 0 refills | Status: DC

## 2023-01-10 NOTE — Progress Notes (Signed)
Please contact the patient. I have reviewed the labs and ordered Depakote to the pharmacy so that she can start taking 500 mg at night. Please advise her to begin this medication and discontinue Vraylar as discussed at her prior visit. Also, advise her to obtain labs five days after starting the medication at [facility].

## 2023-01-12 NOTE — Progress Notes (Signed)
Called to inform patient of lab results no answer left voicemail for patient to return call to office 

## 2023-01-19 NOTE — Progress Notes (Signed)
Spoke to patient she stated that she  started the Depakote on 01/13/23 and would like for the lab orders to be sent to the [facility] at [facility] in [place] please advise

## 2023-01-21 ENCOUNTER — Telehealth: Payer: Self-pay

## 2023-01-21 NOTE — Telephone Encounter (Signed)
-----   Message from [place] sent at 01/10/2023 11:16 AM EDT ----- Please contact the patient. I have reviewed the labs and ordered Depakote to the pharmacy so that she can start taking 500 mg at night. Please advise her to begin this medication and discontinue Vraylar as discussed at her prior visit. Also, advise h er to obtain labs five days after starting the medication at [facility].

## 2023-01-21 NOTE — Telephone Encounter (Signed)
called patient to check and see how she was doing on the depakote. pt states she doing ok so far. she states she going to go do her labs hopefully tomorrow.

## 2023-01-23 ENCOUNTER — Other Ambulatory Visit (HOSPITAL_COMMUNITY): Payer: Self-pay | Admitting: Psychiatry

## 2023-01-24 ENCOUNTER — Encounter: Payer: Self-pay | Admitting: Psychiatry

## 2023-01-24 NOTE — Progress Notes (Signed)
Labs have been reviewed. Please advise the patient to stay on the current dose of Depakote at this time. Although the level is lower than the therapeutic range, that is expected, and it is safe to continue the current dose. Other labs are within normal range.

## 2023-01-26 NOTE — Progress Notes (Signed)
Called to inform patient of lab results she voiced understanding. 
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
## 2023-02-03 ENCOUNTER — Telehealth: Admitting: Psychiatry

## 2023-02-03 ENCOUNTER — Encounter: Payer: Self-pay | Admitting: Psychiatry

## 2023-02-03 DIAGNOSIS — F3181 Bipolar II disorder: Secondary | ICD-10-CM | POA: Diagnosis not present

## 2023-02-03 DIAGNOSIS — F411 Generalized anxiety disorder: Secondary | ICD-10-CM | POA: Diagnosis not present

## 2023-02-03 DIAGNOSIS — R29818 Other symptoms and signs involving the nervous system: Secondary | ICD-10-CM | POA: Diagnosis not present

## 2023-02-03 MED ORDER — DIVALPROEX SODIUM ER 500 MG PO TB24: 500.0000 mg | ORAL_TABLET | Freq: Every day | ORAL | 1 refills | Status: DC

## 2023-02-03 MED ORDER — FLUOXETINE HCL 40 MG PO CAPS
40.0000 mg | ORAL_CAPSULE | Freq: Every day | ORAL | 1 refills | Status: DC
Start: 2023-02-14 — End: 2023-05-08

## 2023-02-04 DIAGNOSIS — A6000 Herpesviral infection of urogenital system, unspecified: Secondary | ICD-10-CM | POA: Insufficient documentation
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
## 2023-03-13 ENCOUNTER — Telehealth (INDEPENDENT_AMBULATORY_CARE_PROVIDER_SITE_OTHER): Admitting: Psychiatry

## 2023-03-13 ENCOUNTER — Encounter: Payer: Self-pay | Admitting: Psychiatry

## 2023-03-13 DIAGNOSIS — F411 Generalized anxiety disorder: Secondary | ICD-10-CM | POA: Diagnosis not present

## 2023-03-13 DIAGNOSIS — R29818 Other symptoms and signs involving the nervous system: Secondary | ICD-10-CM | POA: Diagnosis not present

## 2023-03-13 DIAGNOSIS — F3181 Bipolar II disorder: Secondary | ICD-10-CM

## 2023-03-13 MED ORDER — CARBAMAZEPINE ER 100 MG PO TB12: 100.0000 mg | ORAL_TABLET | Freq: Two times a day (BID) | ORAL | 1 refills | Status: DC

## 2023-03-13 MED ORDER — BUPROPION HCL ER (SR) 100 MG PO TB12: 100.0000 mg | ORAL_TABLET | Freq: Two times a day (BID) | ORAL | 0 refills | Status: DC

## 2023-03-13 NOTE — Patient Instructions (Signed)
Continue fluoxetine 40 mg daily  Continue bupropion 100 mg twice a day  Discontinue Depakote Start carbamazepine 100 mg twice a day Continue lorazepam 1 mg daily as needed for anxiety  Next appointment: 11/5 at 11 am

## 2023-04-23 ENCOUNTER — Encounter: Payer: Self-pay | Admitting: Psychiatry

## 2023-04-23 ENCOUNTER — Telehealth (INDEPENDENT_AMBULATORY_CARE_PROVIDER_SITE_OTHER): Admitting: Psychiatry

## 2023-04-23 ENCOUNTER — Telehealth: Payer: Self-pay

## 2023-04-23 DIAGNOSIS — F3181 Bipolar II disorder: Secondary | ICD-10-CM

## 2023-04-23 DIAGNOSIS — F411 Generalized anxiety disorder: Secondary | ICD-10-CM | POA: Diagnosis not present

## 2023-04-23 DIAGNOSIS — R29818 Other symptoms and signs involving the nervous system: Secondary | ICD-10-CM

## 2023-04-23 DIAGNOSIS — Z79899 Other long term (current) drug therapy: Secondary | ICD-10-CM | POA: Diagnosis not present

## 2023-04-23 MED ORDER — VENLAFAXINE HCL 37.5 MG PO TABS: ORAL_TABLET | ORAL | 0 refills | Status: DC

## 2023-04-23 MED ORDER — VENLAFAXINE HCL ER 75 MG PO CP24: ORAL_CAPSULE | ORAL | 0 refills | Status: DC

## 2023-04-23 MED ORDER — FLUOXETINE HCL 20 MG PO CAPS: 20.0000 mg | ORAL_CAPSULE | Freq: Every day | ORAL | 0 refills | Status: DC

## 2023-04-23 NOTE — Patient Instructions (Signed)
Decrease fluoxetine 20 mg daily for one week, then discotitnue  Start venlafaxine 37.5 mg twice a day for one week, then 75 mg twice a day Continue bupropion 100 mg twice a day  Continue carbamazepine 100 mg twice a day  Continue lorazepam 1 mg daily as needed for anxiety  Obtain lab- CBC, LFT Next appointment: 11/5 at 11 am
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
## 2023-04-23 NOTE — Telephone Encounter (Signed)
pt called she states that she is very depressed, she also states that she feels tired like she doesn't want to wake up. she does not have any suicidal thought. pt was last seen on 9-13 next appt 11-5

## 2023-04-23 NOTE — Telephone Encounter (Signed)
She was able to have a visit today. Details will be in the note.
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
## 2023-05-05 ENCOUNTER — Ambulatory Visit (INDEPENDENT_AMBULATORY_CARE_PROVIDER_SITE_OTHER): Admitting: Psychiatry

## 2023-05-05 ENCOUNTER — Encounter: Payer: Self-pay | Admitting: Psychiatry

## 2023-05-05 VITALS — BP 128/85 | HR 92 | Temp 96.7°F | Ht 65.0 in | Wt 164.2 lb

## 2023-05-05 DIAGNOSIS — R29818 Other symptoms and signs involving the nervous system: Secondary | ICD-10-CM

## 2023-05-05 DIAGNOSIS — Z79899 Other long term (current) drug therapy: Secondary | ICD-10-CM | POA: Diagnosis not present

## 2023-05-05 DIAGNOSIS — F411 Generalized anxiety disorder: Secondary | ICD-10-CM

## 2023-05-05 DIAGNOSIS — F3181 Bipolar II disorder: Secondary | ICD-10-CM

## 2023-05-05 MED ORDER — BUPROPION HCL ER (SR) 100 MG PO TB12: 100.0000 mg | ORAL_TABLET | Freq: Two times a day (BID) | ORAL | 0 refills | Status: DC

## 2023-05-05 NOTE — Patient Instructions (Signed)
Obtain lab- CBC, LFT Increase carbamazepine 200 mg twice a day after obtaining labs  Increase venlafaxine 100 mg twice a day, a week after increasing carbamazepine dose  Continue bupropion 100 mg twice a day  Continue lorazepam 1 mg daily as needed for anxiety  Next appointment: 1/2 at 4:30

## 2023-05-08 ENCOUNTER — Other Ambulatory Visit: Payer: Self-pay | Admitting: Psychiatry

## 2023-05-08 LAB — CBC
Hematocrit: 44.8 % (ref 34.0–46.6)
Hemoglobin: 14.9 g/dL (ref 11.1–15.9)
MCH: 33 pg (ref 26.6–33.0)
MCHC: 33.3 g/dL (ref 31.5–35.7)
MCV: 99 fL — ABNORMAL HIGH (ref 79–97)
Platelets: 267 10*3/uL (ref 150–450)
RBC: 4.51 x10E6/uL (ref 3.77–5.28)
RDW: 11.7 % (ref 11.7–15.4)
WBC: 4.9 10*3/uL (ref 3.4–10.8)

## 2023-05-08 LAB — HEPATIC FUNCTION PANEL
ALT: 21 [IU]/L (ref 0–32)
AST: 18 [IU]/L (ref 0–40)
Albumin: 4.6 g/dL (ref 3.9–4.9)
Alkaline Phosphatase: 85 [IU]/L (ref 44–121)
Bilirubin Total: 0.3 mg/dL (ref 0.0–1.2)
Bilirubin, Direct: 0.14 mg/dL (ref 0.00–0.40)
Total Protein: 6.7 g/dL (ref 6.0–8.5)

## 2023-05-08 MED ORDER — VENLAFAXINE HCL 100 MG PO TABS: 100.0000 mg | ORAL_TABLET | Freq: Two times a day (BID) | ORAL | 1 refills | Status: DC

## 2023-05-08 MED ORDER — CARBAMAZEPINE 200 MG PO TABS: 200.0000 mg | ORAL_TABLET | Freq: Two times a day (BID) | ORAL | 1 refills | Status: DC

## 2023-05-08 NOTE — Progress Notes (Signed)
Reviewed labs. Please advise her to increase carbamazepine to 200 mg twice a day. After one week, increase venlafaxine to 100 mg twice a day. The order has been sent.

## 2023-05-11 ENCOUNTER — Telehealth: Payer: Self-pay

## 2023-05-11 NOTE — Telephone Encounter (Signed)
pt was returning call. pt was given the medication changes instructions.

## 2023-05-11 NOTE — Progress Notes (Signed)
Spoke to patient made her aware of the message from Dr Vanetta Shawl  she voiced understanding

## 2023-05-11 NOTE — Telephone Encounter (Signed)
-----   Message from Tajique sent at 05/08/2023  5:57 PM EST ----- Reviewed labs. Please advise her to increase carbamazepine to 200 mg twice a day. After one week, increase venlafaxine to 100 mg twice a day. The order has been sent.

## 2023-05-11 NOTE — Progress Notes (Signed)
Called patient as instructed by provider to give lab results and instructions on medications no answer left voicemail for patient to return call to office

## 2023-05-12 ENCOUNTER — Other Ambulatory Visit: Payer: Self-pay | Admitting: Psychiatry

## 2023-06-12 ENCOUNTER — Encounter: Payer: Self-pay | Admitting: Psychiatry

## 2023-06-12 ENCOUNTER — Telehealth (INDEPENDENT_AMBULATORY_CARE_PROVIDER_SITE_OTHER): Admitting: Psychiatry

## 2023-06-12 DIAGNOSIS — R29818 Other symptoms and signs involving the nervous system: Secondary | ICD-10-CM | POA: Diagnosis not present

## 2023-06-12 DIAGNOSIS — F411 Generalized anxiety disorder: Secondary | ICD-10-CM | POA: Diagnosis not present

## 2023-06-12 DIAGNOSIS — F3181 Bipolar II disorder: Secondary | ICD-10-CM | POA: Diagnosis not present

## 2023-06-12 DIAGNOSIS — G2581 Restless legs syndrome: Secondary | ICD-10-CM

## 2023-06-12 NOTE — Patient Instructions (Signed)
Continue carbamazepine 200 mg twice a day  Continue venlafaxine 100 mg twice a day Continue bupropion 100 mg twice a day  Continue lorazepam 1 mg daily as needed for anxiety  Obtain lab- ferritin Next appointment: 1/2 at 4:30
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
## 2023-06-27 ENCOUNTER — Encounter: Payer: Self-pay | Admitting: Psychiatry

## 2023-06-27 LAB — FERRITIN: Ferritin: 130 ng/mL (ref 15–150)

## 2023-06-29 ENCOUNTER — Other Ambulatory Visit: Payer: Self-pay | Admitting: Psychiatry

## 2023-07-02 ENCOUNTER — Telehealth: Admitting: Psychiatry

## 2023-07-06 ENCOUNTER — Other Ambulatory Visit: Payer: Self-pay | Admitting: Psychiatry

## 2023-07-14 ENCOUNTER — Other Ambulatory Visit: Payer: Self-pay | Admitting: Psychiatry

## 2023-07-14 ENCOUNTER — Telehealth (INDEPENDENT_AMBULATORY_CARE_PROVIDER_SITE_OTHER): Admitting: Psychiatry

## 2023-07-14 ENCOUNTER — Encounter: Payer: Self-pay | Admitting: Psychiatry

## 2023-07-14 DIAGNOSIS — R29818 Other symptoms and signs involving the nervous system: Secondary | ICD-10-CM

## 2023-07-14 DIAGNOSIS — F3181 Bipolar II disorder: Secondary | ICD-10-CM

## 2023-07-14 DIAGNOSIS — Z79899 Other long term (current) drug therapy: Secondary | ICD-10-CM | POA: Diagnosis not present

## 2023-07-14 DIAGNOSIS — F411 Generalized anxiety disorder: Secondary | ICD-10-CM

## 2023-07-14 DIAGNOSIS — Z5181 Encounter for therapeutic drug level monitoring: Secondary | ICD-10-CM

## 2023-07-14 MED ORDER — CARBAMAZEPINE ER 300 MG PO CP12: 300.0000 mg | ORAL_CAPSULE | Freq: Two times a day (BID) | ORAL | 0 refills | Status: DC

## 2023-07-14 MED ORDER — LORAZEPAM 1 MG PO TABS: 1.0000 mg | ORAL_TABLET | Freq: Every day | ORAL | 0 refills | Status: DC | PRN
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
## 2023-07-26 ENCOUNTER — Other Ambulatory Visit: Payer: Self-pay | Admitting: Psychiatry

## 2023-07-26 MED ORDER — CARBAMAZEPINE ER 300 MG PO CP12: 300.0000 mg | ORAL_CAPSULE | Freq: Two times a day (BID) | ORAL | 0 refills | Status: DC

## 2023-07-29 ENCOUNTER — Telehealth: Admitting: Psychiatry

## 2023-08-08 ENCOUNTER — Encounter: Payer: Self-pay | Admitting: Psychiatry

## 2023-08-08 LAB — CBC
Hematocrit: 40.6 % (ref 34.0–46.6)
Hemoglobin: 13.9 g/dL (ref 11.1–15.9)
MCH: 33.3 pg — ABNORMAL HIGH (ref 26.6–33.0)
MCHC: 34.2 g/dL (ref 31.5–35.7)
MCV: 97 fL (ref 79–97)
Platelets: 221 10*3/uL (ref 150–450)
RBC: 4.18 x10E6/uL (ref 3.77–5.28)
RDW: 12.1 % (ref 11.7–15.4)
WBC: 5.9 10*3/uL (ref 3.4–10.8)

## 2023-08-08 LAB — COMPREHENSIVE METABOLIC PANEL
ALT: 22 [IU]/L (ref 0–32)
AST: 23 [IU]/L (ref 0–40)
Albumin: 4.1 g/dL (ref 3.9–4.9)
Alkaline Phosphatase: 67 [IU]/L (ref 44–121)
BUN/Creatinine Ratio: 17 (ref 9–23)
BUN: 13 mg/dL (ref 6–24)
Bilirubin Total: 0.3 mg/dL (ref 0.0–1.2)
CO2: 29 mmol/L (ref 20–29)
Calcium: 9 mg/dL (ref 8.7–10.2)
Chloride: 99 mmol/L (ref 96–106)
Creatinine, Ser: 0.78 mg/dL (ref 0.57–1.00)
Globulin, Total: 2.1 g/dL (ref 1.5–4.5)
Glucose: 81 mg/dL (ref 70–99)
Potassium: 4 mmol/L (ref 3.5–5.2)
Sodium: 140 mmol/L (ref 134–144)
Total Protein: 6.2 g/dL (ref 6.0–8.5)
eGFR: 93 mL/min/{1.73_m2} (ref >59)

## 2023-08-08 LAB — CARBAMAZEPINE LEVEL, TOTAL: Carbamazepine (Tegretol), S: 4.8 ug/mL (ref 4.0–12.0)
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
## 2023-08-18 DIAGNOSIS — G43909 Migraine, unspecified, not intractable, without status migrainosus: Secondary | ICD-10-CM | POA: Insufficient documentation

## 2023-08-21 ENCOUNTER — Telehealth (INDEPENDENT_AMBULATORY_CARE_PROVIDER_SITE_OTHER): Admitting: Psychiatry

## 2023-08-21 ENCOUNTER — Encounter: Payer: Self-pay | Admitting: Psychiatry

## 2023-08-21 DIAGNOSIS — F411 Generalized anxiety disorder: Secondary | ICD-10-CM | POA: Diagnosis not present

## 2023-08-21 DIAGNOSIS — R29818 Other symptoms and signs involving the nervous system: Secondary | ICD-10-CM | POA: Diagnosis not present

## 2023-08-21 DIAGNOSIS — F3181 Bipolar II disorder: Secondary | ICD-10-CM | POA: Diagnosis not present

## 2023-08-21 MED ORDER — VENLAFAXINE HCL 100 MG PO TABS: 100.0000 mg | ORAL_TABLET | Freq: Two times a day (BID) | ORAL | 1 refills | Status: DC

## 2023-08-21 MED ORDER — CARBAMAZEPINE ER 300 MG PO CP12: 300.0000 mg | ORAL_CAPSULE | Freq: Two times a day (BID) | ORAL | 0 refills | Status: DC

## 2023-08-21 MED ORDER — BUPROPION HCL ER (SR) 100 MG PO TB12: 100.0000 mg | ORAL_TABLET | Freq: Two times a day (BID) | ORAL | 0 refills | Status: DC

## 2023-08-21 NOTE — Patient Instructions (Signed)
 Continue carbamazepine 300 mg twice a day. Continue venlafaxine 100 mg twice a day Continue bupropion 100 mg twice a day  Continue lorazepam 1 mg daily as needed for anxiety  Next appointment: 4/11 at 8 am

## 2023-09-11 NOTE — Telephone Encounter (Signed)
 Could you contact her for a sooner follow-up? There is availability next Tuesday. An in-person appointment is preferred if possible, but a video appointment is also feasible.
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
## 2023-09-15 ENCOUNTER — Ambulatory Visit: Admitting: Psychiatry

## 2023-09-15 ENCOUNTER — Encounter: Payer: Self-pay | Admitting: Psychiatry

## 2023-09-15 VITALS — BP 128/84 | HR 77 | Temp 98.9°F | Ht 65.0 in | Wt 145.0 lb

## 2023-09-15 DIAGNOSIS — R29818 Other symptoms and signs involving the nervous system: Secondary | ICD-10-CM

## 2023-09-15 DIAGNOSIS — R413 Other amnesia: Secondary | ICD-10-CM

## 2023-09-15 DIAGNOSIS — Z79899 Other long term (current) drug therapy: Secondary | ICD-10-CM | POA: Diagnosis not present

## 2023-09-15 DIAGNOSIS — F3181 Bipolar II disorder: Secondary | ICD-10-CM

## 2023-09-15 DIAGNOSIS — F411 Generalized anxiety disorder: Secondary | ICD-10-CM | POA: Diagnosis not present

## 2023-09-15 MED ORDER — CARBAMAZEPINE ER 200 MG PO CP12: 200.0000 mg | ORAL_CAPSULE | Freq: Every day | ORAL | 0 refills | Status: DC
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
## 2023-09-29 ENCOUNTER — Encounter: Payer: Self-pay | Admitting: Psychiatry

## 2023-09-29 ENCOUNTER — Telehealth: Admitting: Psychiatry

## 2023-09-29 DIAGNOSIS — Z79899 Other long term (current) drug therapy: Secondary | ICD-10-CM

## 2023-09-29 DIAGNOSIS — F411 Generalized anxiety disorder: Secondary | ICD-10-CM

## 2023-09-29 DIAGNOSIS — R29818 Other symptoms and signs involving the nervous system: Secondary | ICD-10-CM | POA: Diagnosis not present

## 2023-09-29 DIAGNOSIS — F3181 Bipolar II disorder: Secondary | ICD-10-CM | POA: Diagnosis not present

## 2023-09-29 DIAGNOSIS — Z5181 Encounter for therapeutic drug level monitoring: Secondary | ICD-10-CM

## 2023-09-29 MED ORDER — CARBAMAZEPINE ER 200 MG PO CP12
200.0000 mg | ORAL_CAPSULE | Freq: Every day | ORAL | 0 refills | Status: DC
Start: 2023-10-15 — End: 2023-12-18

## 2023-09-29 NOTE — Patient Instructions (Signed)
 Continue carbamazepine 200 mg in AM, 300 mg in PM  Obtain labs (TSH, CBC, CMP, carbamazepine, vitamin B 12, folate) Continue venlafaxine 100 mg twice a day Continue bupropion 100 mg twice a day  Continue lorazepam 1 mg daily as needed for anxiety  Next appointment: 4/22 at 3:30

## 2023-10-09 ENCOUNTER — Telehealth: Payer: Self-pay | Admitting: Psychiatry
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
## 2023-10-20 ENCOUNTER — Encounter: Payer: Self-pay | Admitting: Psychiatry

## 2023-10-20 ENCOUNTER — Telehealth (INDEPENDENT_AMBULATORY_CARE_PROVIDER_SITE_OTHER): Payer: Self-pay | Admitting: Psychiatry

## 2023-10-20 DIAGNOSIS — F411 Generalized anxiety disorder: Secondary | ICD-10-CM | POA: Diagnosis not present

## 2023-10-20 DIAGNOSIS — F3181 Bipolar II disorder: Secondary | ICD-10-CM | POA: Diagnosis not present

## 2023-10-20 DIAGNOSIS — Z79899 Other long term (current) drug therapy: Secondary | ICD-10-CM | POA: Diagnosis not present

## 2023-10-20 DIAGNOSIS — R29818 Other symptoms and signs involving the nervous system: Secondary | ICD-10-CM | POA: Diagnosis not present

## 2023-10-20 NOTE — Patient Instructions (Signed)
 Decrease carbamazepine  200 mg twice a day Obtain labs (TSH, CBC, CMP, carbamazepine , vitamin B 12, folate) Continue venlafaxine  100 mg twice a day Continue bupropion  100 mg twice a day  Continue lorazepam  1 mg daily as needed for anxiety  Next appointment: 6/6 at 10 am

## 2023-10-22 ENCOUNTER — Encounter: Payer: Self-pay | Admitting: Psychiatry

## 2023-10-22 ENCOUNTER — Other Ambulatory Visit: Payer: Self-pay | Admitting: Psychiatry

## 2023-10-22 ENCOUNTER — Encounter: Payer: Self-pay | Admitting: Urology

## 2023-10-22 ENCOUNTER — Ambulatory Visit: Admitting: Urology

## 2023-10-22 VITALS — BP 132/90 | HR 87

## 2023-10-22 DIAGNOSIS — Z87440 Personal history of urinary (tract) infections: Secondary | ICD-10-CM

## 2023-10-22 DIAGNOSIS — R339 Retention of urine, unspecified: Secondary | ICD-10-CM | POA: Diagnosis not present

## 2023-10-22 DIAGNOSIS — N3010 Interstitial cystitis (chronic) without hematuria: Secondary | ICD-10-CM

## 2023-10-22 DIAGNOSIS — R399 Unspecified symptoms and signs involving the genitourinary system: Secondary | ICD-10-CM

## 2023-10-22 DIAGNOSIS — Z79899 Other long term (current) drug therapy: Secondary | ICD-10-CM

## 2023-10-22 LAB — BLADDER SCAN AMB NON-IMAGING: Scan Result: 0

## 2023-10-22 LAB — TSH: TSH: 1.96 u[IU]/mL (ref 0.450–4.500)

## 2023-10-22 LAB — URINALYSIS, ROUTINE W REFLEX MICROSCOPIC
Bilirubin, UA: NEGATIVE
Glucose, UA: NEGATIVE
Leukocytes,UA: NEGATIVE
Nitrite, UA: NEGATIVE
Protein,UA: NEGATIVE
RBC, UA: NEGATIVE
Specific Gravity, UA: 1.025 (ref 1.005–1.030)
Urobilinogen, Ur: 1 mg/dL (ref 0.2–1.0)
pH, UA: 6 (ref 5.0–7.5)

## 2023-10-22 LAB — CBC
Hematocrit: 41.1 % (ref 34.0–46.6)
Hemoglobin: 13.7 g/dL (ref 11.1–15.9)
MCH: 32.6 pg (ref 26.6–33.0)
MCHC: 33.3 g/dL (ref 31.5–35.7)
MCV: 98 fL — ABNORMAL HIGH (ref 79–97)
Platelets: 226 10*3/uL (ref 150–450)
RBC: 4.2 x10E6/uL (ref 3.77–5.28)
RDW: 11.8 % (ref 11.7–15.4)
WBC: 3.2 10*3/uL — ABNORMAL LOW (ref 3.4–10.8)

## 2023-10-22 LAB — COMPREHENSIVE METABOLIC PANEL WITH GFR
ALT: 21 IU/L (ref 0–32)
AST: 22 IU/L (ref 0–40)
Albumin: 4.3 g/dL (ref 3.9–4.9)
Alkaline Phosphatase: 82 IU/L (ref 44–121)
BUN/Creatinine Ratio: 10 (ref 9–23)
BUN: 8 mg/dL (ref 6–24)
Bilirubin Total: 0.5 mg/dL (ref 0.0–1.2)
CO2: 27 mmol/L (ref 20–29)
Calcium: 9.8 mg/dL (ref 8.7–10.2)
Chloride: 100 mmol/L (ref 96–106)
Creatinine, Ser: 0.79 mg/dL (ref 0.57–1.00)
Globulin, Total: 2.1 g/dL (ref 1.5–4.5)
Glucose: 83 mg/dL (ref 70–99)
Potassium: 4.7 mmol/L (ref 3.5–5.2)
Sodium: 140 mmol/L (ref 134–144)
Total Protein: 6.4 g/dL (ref 6.0–8.5)
eGFR: 91 mL/min/{1.73_m2} (ref >59)

## 2023-10-22 LAB — VITAMIN B12: Vitamin B-12: 758 pg/mL (ref 232–1245)

## 2023-10-22 LAB — CARBAMAZEPINE LEVEL, TOTAL: Carbamazepine (Tegretol), S: 6 ug/mL (ref 4.0–12.0)

## 2023-10-22 LAB — FOLATE: Folate: 5.6 ng/mL (ref >3.0)

## 2023-10-22 MED ORDER — HYDROXYZINE HCL 25 MG PO TABS
25.0000 mg | ORAL_TABLET | Freq: Every evening | ORAL | 11 refills | Status: DC
Start: 2023-10-22 — End: 2024-04-27

## 2023-10-22 NOTE — Progress Notes (Signed)
 Called patient to make aware of her lab results she voiced understanding and will call the office if she develops any prolonged fever or signs of infection

## 2023-10-22 NOTE — Progress Notes (Signed)
 The patient's blood work shows a slightly low WBC count, which may be related to carbamazepine . As previously discussed, please advise her to reduce the dose to 200 mg twice daily.  Also, please let her know to repeat the blood test at [facility] within a month. In the meantime, if she develops any prolonged fever or signs of infection, please advise her to contact us .

## 2023-10-22 NOTE — Progress Notes (Signed)
 Name: Sharon Merritt DOB: 10-02-1975 MRN: [id number]  History of Present Illness: Sharon Merritt is a 48 y.o. female who presents today as a new patient at [facility]. All available relevant medical records have been reviewed. Previously evaluated by Dr. Joie Narrow at [facility] in 2018.  Recent history:  > 01/28/2023: - Per note by E. Kipp People NP: "Patient went to [facility] in April and was treated for UTI Since then she's had 2-3 more episodes of UTI like symptoms Monday, she felt as though when she was having bladder spasms Only able to urinate a little bit Right flank pain as well. On Sunday, felt really unwell and then on Monday she felt fine! Could be interstitial cystitis??"  > 10/08/2023:  - Seen by PCP. Per referral note: "symptoms x 1.5 weeks. has had flares like this for years. almost always without culture results. burning pressure, hurting pee darker and with stronger smell once with hematuria low back pain. had gall stones, but not kidney stones." - Urine microscopy: 0-5 WBC/hpf, 0-2 RBC/hpf, few bacteria - Urine culture positive for E. Coli.  > 10/11/2023:  - Treated with Fosfomycin 3 grams every 72 hours x3 doses.  > 10/21/2023: - Normal renal function (creatinine 0.79, GFR 91).  No additional urine culture results in past 12 months found per chart review.   Today: She reports intermittent sensation of pressure in her bladder and urethra. Also reports intermittent bladder pain which is described as "pretty intense" and typically lasts few a couple of days. She states the pain starts and resolves spontaneously; denies any  identified dietary, behavioral, emotional, or environmental triggers or exacerbating factors.  During flare ups she experiences increased urinary urgency, frequency, urge incontinence, dysuria, straining to void, or sensations of incomplete emptying. Sometimes will also noticed blood on toilet paper when wiping during flare ups. Denies ever  having gross hematuria.  She denies increased pain with bladder filling. She denies relief with voiding.  She denies flank pain, nausea, or vomiting. She denies history of kidney stones.    Medications: Current Outpatient Medications  Medication Sig Dispense Refill   buPROPion  ER (WELLBUTRIN  SR) 100 MG 12 hr tablet Take 1 tablet (100 mg total) by mouth 2 (two) times daily. 180 tablet 0   carbamazepine  (CARBATROL ) 200 MG 12 hr capsule Take 1 capsule (200 mg total) by mouth daily. (Patient taking differently: Take 200 mg by mouth 2 (two) times daily.) 90 capsule 0   hydrOXYzine  (ATARAX ) 25 MG tablet Take 1 tablet (25 mg total) by mouth at bedtime. 30 tablet 11   ibuprofen (ADVIL) 800 MG tablet Take by mouth as needed.     LORazepam  (ATIVAN ) 1 MG tablet Take 1 tablet (1 mg total) by mouth daily as needed for up to 30 doses for anxiety. 30 tablet 0   omeprazole (PRILOSEC) 20 MG capsule Take 20 mg by mouth daily.     Semaglutide-Weight Management (WEGOVY [place]) once a week.     valACYclovir (VALTREX) 1000 MG tablet Take 1,000 mg by mouth daily as needed (Epstein-Barr).     venlafaxine  (EFFEXOR ) 100 MG tablet Take 1 tablet (100 mg total) by mouth 2 (two) times daily. 180 tablet 1   albuterol  (PROVENTIL ) (2.5 MG/3ML) 0.083% nebulizer solution as needed. (Patient not taking: Reported on 10/22/2023)     albuterol  (VENTOLIN  HFA) 108 (90 Base) MCG/ACT inhaler Inhale 2 puffs into the lungs daily as needed (Asthma). (Patient not taking: Reported on 10/22/2023)     carbamazepine  (CARBATROL )  300 MG 12 hr capsule Take 1 capsule (300 mg total) by mouth 2 (two) times daily. (Patient not taking: Reported on 10/22/2023) 180 capsule 0   cetirizine  (ZYRTEC ) 10 MG tablet once. (Patient not taking: Reported on 10/22/2023)     fluticasone -salmeterol (ADVAIR  HFA) 115-21 MCG/ACT inhaler Inhale 2 puffs into the lungs 2 (two) times daily. (Patient not taking: Reported on 10/22/2023)     HYDROcodone -acetaminophen   (NORCO/VICODIN) 5-325 MG tablet Take 1 tablet by mouth every 6 (six) hours as needed. (Patient not taking: Reported on 10/22/2023) 10 tablet 0   nitroGLYCERIN  (NITROSTAT ) 0.4 MG SL tablet Place 1 tablet (0.4 mg total) under the tongue every 5 (five) minutes as needed for chest pain. (Patient not taking: Reported on 10/22/2023) 10 tablet 1   ondansetron  (ZOFRAN -ODT) 4 MG disintegrating tablet Dissolve 1 tablet (4 mg total) by mouth every 6 (six) hours as needed for nausea or vomiting. (Patient not taking: Reported on 10/22/2023) 20 tablet 0   pantoprazole  (PROTONIX ) 40 MG tablet Take 1 tablet (40 mg total) by mouth daily. (Patient not taking: Reported on 10/22/2023) 90 tablet 0   phenazopyridine (PYRIDIUM) 100 MG tablet once. (Patient not taking: Reported on 10/22/2023)     No current facility-administered medications for this visit.    Allergies: No Known Allergies  Past Medical History:  Diagnosis Date   Anxiety    Asthma    Bipolar 1 disorder (HCC)    Breast discharge    Depression    Epstein Barr infection    Essential hypertension    Fibromyalgia    Lyme disease    Pneumonia    [place] spotted fever    Past Surgical History:  Procedure Laterality Date   LAPAROSCOPIC GASTRIC SLEEVE RESECTION N/A 04/23/2021   Procedure: LAPAROSCOPIC GASTRIC SLEEVE RESECTION;  Surgeon: Dorrie Gaudier, Alphonso Aschoff, MD;  Location: [facility];  Service: General;  Laterality: N/A;   TUBAL LIGATION     UPPER GI ENDOSCOPY N/A 04/23/2021   Procedure: UPPER GI ENDOSCOPY;  Surgeon: Dorrie Gaudier, Alphonso Aschoff, MD;  Location: [facility];  Service: General;  Laterality: N/A;   WISDOM TOOTH EXTRACTION     Family History  Problem Relation Age of Onset   COPD Mother    Depression Mother    Diabetes Father    Hypertension Father    Stroke Father    Bipolar disorder Sister    Post-traumatic stress disorder Sister    Alcohol abuse Sister    Drug abuse Sister    Pulmonary embolism Sister    Bipolar disorder Cousin     Schizophrenia Cousin    Social History   Socioeconomic History   Marital status: Married    Spouse name: Bearl Limes    Number of children: 3   Years of education: Assoc    Highest education level: Not on file  Occupational History    Employer: [employer] copy  Tobacco Use   Smoking status: Former    Current packs/day: 0.00    Types: Cigarettes    Quit date: 02/14/2009    Years since quitting: 14.6   Smokeless tobacco: Never  Vaping Use   Vaping status: Never Used  Substance and Sexual Activity   Alcohol use: Not Currently    Alcohol/week: 0.0 standard drinks of alcohol    Comment: Rarely    Drug use: No   Sexual activity: Not Currently    Birth control/protection: Surgical  Other Topics Concern   Not on file  Social History Narrative   Patient  lives at home with her husband Bearl Limes) and her children.    Patient works full time.   Caffeine- Tea 3-4 times daily.   Patient is right-handed.   Patient has a Scientist, research (physical sciences).         Social Drivers of Corporate investment banker Strain: Not on file  Food Insecurity: Not on file  Transportation Needs: Not on file  Physical Activity: Not on file  Stress: Not on file  Social Connections: Not on file  Intimate Partner Violence: Not on file    SUBJECTIVE  Review of Systems Constitutional: Patient denies any unintentional weight loss or change in strength lntegumentary: Patient denies any rashes or pruritus Cardiovascular: Patient denies chest pain or syncope Respiratory: Patient denies shortness of breath Gastrointestinal: As per HPI  Musculoskeletal: Patient denies muscle cramps or weakness Neurologic: Patient denies convulsions or seizures Allergic/Immunologic: Patient denies recent allergic reaction(s) Hematologic/Lymphatic: Patient denies bleeding tendencies Endocrine: Patient denies heat/cold intolerance  GU: As per HPI.  OBJECTIVE Vitals:   10/22/23 0905  BP: (!) 132/90  Pulse: 87   There is no height or  weight on file to calculate BMI.  Physical Examination Constitutional: No obvious distress; patient is non-toxic appearing  Cardiovascular: No visible lower extremity edema.  Respiratory: The patient does not have audible wheezing/stridor; respirations do not appear labored  Gastrointestinal: Abdomen non-distended Musculoskeletal: Normal ROM of UEs  Skin: No obvious rashes/open sores  Neurologic: CN 2-12 grossly intact Psychiatric: Answered questions appropriately with normal affect  Hematologic/Lymphatic/Immunologic: No obvious bruises or sites of spontaneous bleeding  UA: unremarkable PVR: 0 ml  ASSESSMENT UTI symptoms - Plan: Urinalysis, Routine w reflex microscopic, BLADDER SCAN AMB NON-IMAGING, US  RENAL  History of recurrent UTIs - Plan: US  RENAL  Interstitial cystitis - Plan: hydrOXYzine  (ATARAX ) 25 MG tablet  Discussed possible etiologies including but not limited to: UTI, non-infectious cystitis, interstitial cystitis, GU stone, malignancy.   Agreed to obtain RUS for further evaluation / to assess for possible bladder / kidney stones.  For suspected interstitial cystitis (IC): - We discussed in detail the etiology of this condition. We reviewed normal bladder function and what is known and not known about IC/ PBS. We reviewed that IC is not thought to be related to any infectious process or autoimmune conditions. - We reviewed the need to treat the abnormal GAG layer, mast cell up-regulation, and neurogenic inflammation simultaneously in order to adequately treat all components of the IC symptom complex. - We reviewed the benefits v. risks/burdens of the available treatment alternatives, the fact that no single treatment has been found effective for the majority of patients, and the fact that acceptable symptom control may require trials of multiple therapeutic options (including combination therapy) before it is achieved. - We discussed the overlap of acute UTI symptoms versus  symptoms of IC flare ups. Both can cause increased bladder pain, dysuria, increased urinary frequency/urgency. Patient was informed that we recommend obtaining a urine culture prior to antibiotic treatment in the future. We discussed antibiotic stewardship and goal to minimize risk for developing antibiotic resistance. - We reviewed conservative IC treatment options while ruling out other possible etiologies including: IC diet to avoid irritative beverages and foods. For histamine-mediated bladder inflammation: Antihistamines such as Hydroxyzine  or OTC options (preferably Zyrtec  or Xyzal).   We discussed possible contributing factors for IC flare ups such as: stress dietary triggers seasonal environmental allergens  Patient was advised that if/when acute UTI-like symptoms occur they can call our office to speak  with a nurse, who can place an order for urinalysis (with reflex to urine culture). They may then proceed to a [facility] to provide their urine sample. This is required for evaluation in order for their Urology provider to make informed treatment recommendation(s), which may or may not include an antibiotic prescription.  Will request any additonal urine culture results from urgent care for review along with prior Urology records from [facility].   We agreed to plan for follow up in 4-6 weeks for recheck. Patient verbalized understanding of and agreement with current plan. All questions were answered.  PLAN Advised the following: Requesting outside urine cultures and prior urology records. Start Hydroxyzine  25 mg nightly. Can add on daily morning OTC Zyrtec  or Xyzal during flare ups.  Renal / bladder ultrasound. Return in about 4 weeks (around 11/19/2023) for UA, PVR, & f/u with Griselda Lederer NP.  Orders Placed This Encounter  Procedures   US  RENAL    Standing Status:   Future    Expected Date:   10/22/2023    Expiration Date:   10/21/2024    Reason for Exam  (SYMPTOM  OR DIAGNOSIS REQUIRED):   kidney stone known or suspected    Preferred imaging location?:   [facility]   Urinalysis, Routine w reflex microscopic   BLADDER SCAN AMB NON-IMAGING   Total time spent caring for the patient today was over 45 minutes. This includes time spent on the date of the visit reviewing the patient's chart before the visit, time spent during the visit, and time spent after the visit on documentation. Over 50% of that time was spent in face-to-face time with this patient for direct counseling. E&M based on time and complexity of medical decision making.  It has been explained that the patient is to follow regularly with their PCP in addition to all other providers involved in their care and to follow instructions provided by these respective offices. Patient advised to contact urology clinic if any urologic-pertaining questions, concerns, new symptoms or problems arise in the interim period.  Patient Instructions  UTI prevention / management:  Difference between Urinalysis (urine dipstick test) and Urine culture:  Urinalysis (urine dipstick test): A quick office test used as an indicator to determine whether or not further testing is necessary (such as a urine culture, urine microscopy, etc.) The urinalysis cannot differentiate a true bacterial UTI or give a definitive diagnosis for the findings.  Urine culture: May be performed based on the findings of a urinalysis to evaluate for UTI. Grows out on a petri dish for 48-72 hours. Provides important information about: whether or not bacterial growth is present and if so: what the predominant bacteria is which antibiotics will work best against that bacteria That information is important so that we can diagnose and treat patients appropriately as there are other conditions which may mimic UTls which must not be missed (such as cancer, interstitial cystitis, stones, etc.). Assists us  with antibiotic  stewardship to minimize patient's risk for developing antibiotic resistance (getting to a point where no antibiotics work anymore).  Options when UTI symptoms occur: 1. Call [facility] and request to speak with triage nurse (phone # [phone], select option 3). In accordance with clinic guidelines the nurse will determine next steps based on patient-reported symptoms, which may include: same-day lab visit to provide urine specimen, recommendation to schedule Urology office visit appointment for further evaluation, recommendation to proceed to ER, etc. 2. Call your Primary Care  Provider (PCP) office to request urgent / same-day visit. Be sure to request for urine culture to be ordered and have results faxed to Urology (fax # [phone]).  3. Go to urgent care. Be sure to request for urine culture to be ordered and have results faxed to Urology (fax # [phone]).   For bladder pain/ burning with urination: - Can take over-the-counter Pyridium (phenazopyridine; commonly known under the "AZO" brand) for a few days as needed. Limit use to no more than 3 days consecutively due to risk for methemoglobinemia, liver function issues, and bone health damage with long term use of Pyridium. - Alternative: Prescription urinary analgesics (such as Uribel, Urogesic blue, Urelle, Uro-MP). Often expensive / poorly covered by insurance unfortunately.  Options / recommendations for UTI prevention: - Adequate daily fluid intake to flush out the urinary tract. - Go to the bathroom to urinate every 4-6 hours while awake to minimize urinary stasis / bacterial overgrowth in the bladder. - Proanthocyanidin (PAC) supplement 36 mg daily; must be soluble (insoluble form of PAC will be ineffective). Recommended brand: Ellura. This is an over-the-counter supplement (often must be found/ purchased online) supplement derived from cranberries with concentrated active component: Proanthocyanidin (PAC) 36 mg  daily. Decreases bacterial adherence to bladder lining.  - D-mannose powder (2 grams daily). This is an over-the-counter supplement which decreases bacterial adherence to bladder lining (it is a sugar that inhibits bacterial adherence to urothelial cells by binding to the pili of enteric bacteria). Take as per manufacturer recommendation. Can be used as an alternative or in addition to the concentrated cranberry supplement.  - Vitamin C supplement to acidify urine to minimize bacterial growth.  - Probiotic to maintain healthy vaginal microbiome to suppress bacteria at urethral opening. Brand recommendations: Estill Hemming (includes probiotic & D-mannose ), Feminine Balance (highest concentration of lactobacillus) or Hyperbiotic Pro 15.  Note for patients with diabetes:  - Be aware that D-mannose contains sugar.  Note for patients with interstitial cystitis (IC):  - Patients with IC should typically avoid cranberry/ PAC supplements and Vitamin C supplements due to their acidity, which may exacerbate IC-related bladder pain. - Symptoms of true bacterial UTI can overlap / mimic symptoms of an IC flare up. Antibiotic use is NOT indicated for IC flare ups. Urine culture needed prior to antibiotic treatment for IC patients. The goal is to minimize your risk for developing antibiotic-resistant bacteria.          American Urological Association (AUA) -  Diagnosis and Treatment of Interstitial Cystitis/Bladder Pain Syndrome (2022): [email or web address] pain-syndrome-(2022)  The exact cause of Interstitial Cystitis (IC) (aka Bladder Pain Syndrome) is unknown, however theories include: Dysfunction of inner bladder lining known as the glycosaminoglycan (GAG) layer. Intrinsic abnormality of the urine. Histamine-related bladder inflammation/ irritation. Neuropathic pain/ nerve up-regulation, which results in  inappropriately elevated pain signaling from the nerves in/around bladder to the brain. IC is not related to any infectious process or autoimmune conditions.  Commonly related problems for patients with IC: High-tone pelvic floor dysfunction (pelvic floor muscle tightness). Can cause pelvic pain, difficulty urinating, difficulty having bowel movements, pain with intercourse. Neuropathic pain / Nerve up-regulation. Results in inappropriately elevated pain signaling from the nerves in/around bladder to the brain. Several commonly co-occurring conditions include IBS, fibromyalgia, CFIDS, migraines, TMJ syndrome, and seasonal environmental allergies. IC flare ups can mimic the symptoms of UTI with no true bacterial infection being present. Any acute exacerbation of symptoms warrants evaluation with a urine culture.  Common triggers for IC  flare ups: Certain beverages and foods which irritate the bladder This can be very patient specific See IC diet for list of common dietary irritants Physical stress Emotional / psychological stress Activities that put pressure on the pelvic area/ perineum (such as sexual intercourse, long car rides, etc) Seasonal environmental allergens  IC treatment options (for daily/ routine use): 1. Patient efforts to minimize life stressors: a. Educate employer/ support people about condition and related care needs b. Practice self care b. May benefit from mental health treatment with primary care provider or psychiatrist/ psychologist / therapist 2. For dysfunction of inner bladder lining (glycosaminoglycan (GAG) layer): a. Prescription medications: - Oral medication: Pentosan polysulfate (Elmiron) > May be cost prohibitive; not covered well by some insurance providers. Can be ordered via a compounding pharmacy as an alternative. > Requires 6 months of daily use (3 times per day) to achieve full efficacy and to assess symptomatic response. > Requires routine annual  eye exam. b. Medications instilled directly into the bladder through a catheter: - Pentosan polysulfate ([place]iron) - Heparin  3. For intrinsic abnormality of the urine: Identification and avoidance of dietary bladder irritants (commonly includes caffeine, alcohol, citric acid, spicy foods, acidic foods). For histamine-related bladder inflammation/ irritation: Antihistamine medication(s): Minimizes the uptake of histamine molecules Over-the-counter medications: Zyrtec  (Cetirizine ) and Xyzal (Levocetirizine) are preferred Prescription medications: Hydroxyzine  (Vistaril  / Atarax ) Mast cell stabilizer medication:  Montelukast (Singulair): Inhibits leukotriene receptors present in the bladder, thus preventing the activation of mast cells (which release pro-inflammatory mediators) For neuropathic pain/ nerve up-regulation, which results in inappropriately elevated pain signaling from the nerves in/around bladder to the brain: Prescription medications:  Such as: Amitriptyline (Elavil), Nortriptyline (Pamelor), Trazodone , Cymbalta (Duloxetine), Gabapentin  (Neurontin ), Lyrica (Pregabalin)  IC treatment options for flare ups (for on-demand use): Urinary analgesics for burning / painful urination Over-the-counter medications: Phenazopyradine (Pyridium). Use should be limited to no more than 3 days consecutively due to risk for methemoglobinemia and damage to liver & bone health. Prescription medications: Uribel, Urogesic blue, Uro-MP, Urelle, etc. Can be take every 6 hours as needed May be cost prohibitive; not covered well by some insurance providers Bladder instillations: Placement of a numbing medication (such as Lidocaine  + sodium bicarbonate or Bupivacaine ) through a catheter into the bladder. Can be done in-office at a nurse visit or at home Note: These medications are currently in short supply nationwide and may be difficult to obtain until the shortage improves. Can add on an over-the-counter  antihistamine or TUMS (if not already taking)  Management options for high-tone pelvic floor dysfunction secondary to IC: Stretching, relaxation techniques (such as deep breathing/ meditation), yoga, avoidance of Kegels or other activities which strain or tighten pelvic floor muscles Pelvic floor physical therapy Oral muscle relaxer medications such as Baclofen (Lioresal), Methocarbamol (Robaxin), Tizanadine (Zanaflex), Cyclobenzaprine (Flexeril) Vaginal or rectal Diazepam (Valium) suppositories Trigger point injections Pelvic nerve blocks  Additional IC treatment options: Some patients find benefit from use of baking soda to reduce the acidity of the urine. Can dissolve half a teaspoon in a full glass of water and drink twice a day. Increased water intake can dilute urine concentration - may help by reducing urine acidity.  Supplements such as Bladder Renew, CystoProtek, Cysta-Q, Prelief, Freeze-dried aloe vera, Marshmallow root, etc. These have not been evaluated by the FDA. Bladder Renew, CystoProtek, Cysta-Q: Sometimes used as an alternative to Elmiron. Prelief is an over the counter medication available at most pharmacies. Prelief works to remove acid from highly acidic foods such as tomato  sauce, orange juice, coffee, and wine. Prelief can be added directly to these foods and helps to reduce bladder pain and urinary urgency. Cystoscopy with hydrodistention  a. This is an outpatient surgical procedure done in the OR under anesthesia for diagnostic benefit and   potential therapeutic benefit. It helps clinicians rule out other conditions, assess/ treat pain triggers such as Hunner's ulcers in the bladder, and evaluate bladder capacity (size). There is approximately 60% chance of symptomatic improvement with hydrodistention; the response can be quite variable. Rare surgical interventions Suprapubic catheter placement Bladder removal (cystectomy) with urinary diversion (such as ileal  conduit, [place]  pouch, or neobladder creation)        Common triggers for IC flare ups: Certain beverages and foods which irritate the bladder This can be very patient specific See IC diet for list of common dietary irritants Physical stress Emotional / psychological stress Activities that put pressure on the pelvic area I perineum (such as sexual intercourse, long car rides, etc) Seasonal environmental allergens Urinary tract infection (UTI). You can call the urology office [phone]) to request lab order for urine testing if desired. May require antibiotic therapy if deemed appropriate based on urine culture result. Post-infectious (post-UTI) bladder inflammation. Can take several days or weeks to resolve.  IC treatment options for flare ups (for on-demand use):  Urinary analgesics for burning / painful urination Over-the-counter medications:  Phenazopyradine (Pyridium). Commonly known under the AZO brand name. Use should be limited to no more than 3 days consecutively due to risk for methemoglobinemia and damage to liver & bone health. Prescription medications: Uribel, Urogesic blue, Uro-MP, Urelle, etc. Can be take every 6 hours as needed May be cost prohibitive; not covered well by some insurance providers Antihistamine use to minimize histamine-mediated bladder inflammation / pain If taking Hydroxyzine  (a prescription antihistamine), may temporarily increase dose at bedtime as directed by provider. Can add on an over-the-counter antihistamine (Zyrtec  or Xyzal preferred). Bladder instillations: Placement of a numbing medication (such as Lidocaine  + sodium bicarbonate or Bupivacaine ) through a catheter into the bladder.  b. Can be done in-office at a nurse visit or at home.   c. Note: These medications are currently in short supply nationwide and may be difficult to obtain until the shortage improves.  Other: If symptoms fail to improve in a timely fashion, could also  consider cystoscopy with hydrodistention for potential diagnostic and therapeutic benefit.        INTERSTITIAL CYSTITIS DIET  Many people with Interstitial Cystitis find that simple changes in their diet can help to control IC symptoms and avoid IC flare-ups. Typically, avoiding foods high in acid and potassium, as well as beverages containing caffeine and alcohol, is a good idea. This helpful guide can help you make "IC-Smart" meal choices. Keep it handy for easy reference when dining out or when preparing meals at home.  FRUITS: Allowable - Blueberries, Melons (except cantaloupe), Minute Maid Acid-Reduced Orange Juice (this can be diluted using water or Prelief* to prevent a flare-up) and Pears.  Avoidable -All other Fruits and Juices.  VEGETABLES:  Allowable - Homegrown Tomatoes, Acorn, Alfalfa, Beet, Broccoli, Bok Choy, [address], [place], [place], [place], [place], [place], [place], [place], [place], Mustard [place] and [employer].  Avoidable - Store-bought Tomatoes, Onions, Tofu, Soybeans, Lima beans, Fava Beans, Asparagus, Beet Greens, [place], [place], [place], [place], [place], [place], Mushrooms, [place], [place], [place], [place], [employer] and Rhubarb.  MEATS/FISH: Allowable - Beef, Chicken, [place], [place], [address], [address], [place], [place], [place], [place], [place], [place], [place],  Sole Mackerel, [place], [place] and Shrimp.  Avoidable - Chicken Liver, Corned Beef, Pickled Herring and Fresh Herring, Aged, [address], Cured, Processed or Smoked Meats/Fish, [place], [place], [address] [place], [place], [place], [place], [place], [place], [place] and meats that contain nitrates or nitrites.  CARBS/GRAINS:  Allowable - Pasta, Rice, Potatoes, White Bread, Bread made of Rice, Millet, Quinoa, Buckwheat, Matzoth, Refined, Cooked or Ready to Eat Cereal.  Avoidable - Rye Bread, Sourdough Breads and Fresh Baked Yeast Products  FATS:    DAIRY: Allowable - Butter, Margarine, Vegetable  Oils, Almonds, Cashews and Pine Nuts. Avoidable - Any other Nuts, Mayonnaise, Salad Dressing. Allowable - Milk, American Cheese, [address], Cream Cheese, String Cheese, [place], Ricotta Cheese, Frozen Yogurt and White Chocolate.   Avoidable - Yogurt, Goat's Milk, Chocolate Milk, Sour Cream, Aged Cheeses, Chocolate, Boursalt, Camembert, Cheddar, [address], [place], [place], [place], [place], [place], [place], [place], [place] and [place].  SOUPS: Allowable - Soups made with allowed Vegetables, Cream of Broccoli, Water Cress Soup, Cream of Cauliflower Soup.  Avoidable -Any packaged Soups.   SEASONINGS:  Allowable - Garlic and other seasonings not listed below.  Avoid - Miso, Soy Sauce, Vinegar and any Spicy Foods (especially [place], [place], [place] and [place] foods)  BEVERAGES:  Allowable - Bottled or Spring Water, Decaffeinated, Acid-free or Low Acid Coffee or Tea, Alfalfa Leaf Tea, Sun Tea, Kava Instant Coffee, Peppermint Tea, Golden Seal Tea, Flat Soda and Low Acid Wines.  Avoid -Alcoholic Beverages (except Low Acid Wines), Carbonated Drinks such as Soda, Coffee and Tea (except what is listed above), Fruit Juices and Chamomile Tea.  PRESERVATIVE: Avoid - Benzol Alcohol, Citric Acid, Monosodium Glutamate (MSG), Aspartame, Saccharin and foods containing Artificial Ingredients/Colors.         Electronically signed by:  Lauretta Ponto, MSN, FNP-C, CUNP 10/22/2023 11:43 AM

## 2023-10-22 NOTE — Patient Instructions (Signed)
 UTI prevention / management:  Difference between Urinalysis (urine dipstick test) and Urine culture:  Urinalysis (urine dipstick test): A quick office test used as an indicator to determine whether or not further testing is necessary (such as a urine culture, urine microscopy, etc.) The urinalysis cannot differentiate a true bacterial UTI or give a definitive diagnosis for the findings.  Urine culture: May be performed based on the findings of a urinalysis to evaluate for UTI. Grows out on a petri dish for 48-72 hours. Provides important information about: whether or not bacterial growth is present and if so: what the predominant bacteria is which antibiotics will work best against that bacteria That information is important so that we can diagnose and treat patients appropriately as there are other conditions which may mimic UTls which must not be missed (such as cancer, interstitial cystitis, stones, etc.). Assists us  with antibiotic stewardship to minimize patient's risk for developing antibiotic resistance (getting to a point where no antibiotics work anymore).  Options when UTI symptoms occur: 1. Call [facility] [place] and request to speak with triage nurse (phone # [phone], select option 3). In accordance with clinic guidelines the nurse will determine next steps based on patient-reported symptoms, which may include: same-day lab visit to provide urine specimen, recommendation to schedule Urology office visit appointment for further evaluation, recommendation to proceed to ER, etc. 2. Call your Primary Care Provider (PCP) office to request urgent / same-day visit. Be sure to request for urine culture to be ordered and have results faxed to Urology (fax # [phone]).  3. Go to urgent care. Be sure to request for urine culture to be ordered and have results faxed to Urology (fax # [phone]).   For bladder pain/ burning with urination: - Can take over-the-counter  Pyridium (phenazopyridine; commonly known under the "AZO" brand) for a few days as needed. Limit use to no more than 3 days consecutively due to risk for methemoglobinemia, liver function issues, and bone health damage with long term use of Pyridium. - Alternative: Prescription urinary analgesics (such as Uribel, Urogesic blue, Urelle, Uro-MP). Often expensive / poorly covered by insurance unfortunately.  Options / recommendations for UTI prevention: - Adequate daily fluid intake to flush out the urinary tract. - Go to the bathroom to urinate every 4-6 hours while awake to minimize urinary stasis / bacterial overgrowth in the bladder. - Proanthocyanidin (PAC) supplement 36 mg daily; must be soluble (insoluble form of PAC will be ineffective). Recommended brand: Ellura. This is an over-the-counter supplement (often must be found/ purchased online) supplement derived from cranberries with concentrated active component: Proanthocyanidin (PAC) 36 mg daily. Decreases bacterial adherence to bladder lining.  - D-mannose powder (2 grams daily). This is an over-the-counter supplement which decreases bacterial adherence to bladder lining (it is a sugar that inhibits bacterial adherence to urothelial cells by binding to the pili of enteric bacteria). Take as per manufacturer recommendation. Can be used as an alternative or in addition to the concentrated cranberry supplement.  - Vitamin C supplement to acidify urine to minimize bacterial growth.  - Probiotic to maintain healthy vaginal microbiome to suppress bacteria at urethral opening. Brand recommendations: Estill Hemming (includes probiotic & D-mannose ), Feminine Balance (highest concentration of lactobacillus) or Hyperbiotic Pro 15.  Note for patients with diabetes:  - Be aware that D-mannose contains sugar.  Note for patients with interstitial cystitis (IC):  - Patients with IC should typically avoid cranberry/ PAC supplements and Vitamin C supplements due to  their acidity,  which may exacerbate IC-related bladder pain. - Symptoms of true bacterial UTI can overlap / mimic symptoms of an IC flare up. Antibiotic use is NOT indicated for IC flare ups. Urine culture needed prior to antibiotic treatment for IC patients. The goal is to minimize your risk for developing antibiotic-resistant bacteria.          American Urological Association (AUA) -  Diagnosis and Treatment of Interstitial Cystitis/Bladder Pain Syndrome (2022): [email or web address] pain-syndrome-(2022)  The exact cause of Interstitial Cystitis (IC) (aka Bladder Pain Syndrome) is unknown, however theories include: Dysfunction of inner bladder lining known as the glycosaminoglycan (GAG) layer. Intrinsic abnormality of the urine. Histamine-related bladder inflammation/ irritation. Neuropathic pain/ nerve up-regulation, which results in inappropriately elevated pain signaling from the nerves in/around bladder to the brain. IC is not related to any infectious process or autoimmune conditions.  Commonly related problems for patients with IC: High-tone pelvic floor dysfunction (pelvic floor muscle tightness). Can cause pelvic pain, difficulty urinating, difficulty having bowel movements, pain with intercourse. Neuropathic pain / Nerve up-regulation. Results in inappropriately elevated pain signaling from the nerves in/around bladder to the brain. Several commonly co-occurring conditions include IBS, fibromyalgia, CFIDS, migraines, TMJ syndrome, and seasonal environmental allergies. IC flare ups can mimic the symptoms of UTI with no true bacterial infection being present. Any acute exacerbation of symptoms warrants evaluation with a urine culture.  Common triggers for IC flare ups: Certain beverages and foods which irritate the bladder This can be very patient specific See IC diet for list of  common dietary irritants Physical stress Emotional / psychological stress Activities that put pressure on the pelvic area/ perineum (such as sexual intercourse, long car rides, etc) Seasonal environmental allergens  IC treatment options (for daily/ routine use): 1. Patient efforts to minimize life stressors: a. Educate employer/ support people about condition and related care needs b. Practice self care b. May benefit from mental health treatment with primary care provider or psychiatrist/ psychologist / therapist 2. For dysfunction of inner bladder lining (glycosaminoglycan (GAG) layer): a. Prescription medications: - Oral medication: Pentosan polysulfate (Elmiron) > May be cost prohibitive; not covered well by some insurance providers. Can be ordered via a compounding pharmacy as an alternative. > Requires 6 months of daily use (3 times per day) to achieve full efficacy and to assess symptomatic response. > Requires routine annual eye exam. b. Medications instilled directly into the bladder through a catheter: - Pentosan polysulfate (Elmiron) - Heparin  3. For intrinsic abnormality of the urine: Identification and avoidance of dietary bladder irritants (commonly includes caffeine, alcohol, citric acid, spicy foods, acidic foods). For histamine-related bladder inflammation/ irritation: Antihistamine medication(s): Minimizes the uptake of histamine molecules Over-the-counter medications: Zyrtec  (Cetirizine ) and Xyzal (Levocetirizine) are preferred Prescription medications: Hydroxyzine  (Vistaril  / Atarax ) Mast cell stabilizer medication:  Montelukast (Singulair): Inhibits leukotriene receptors present in the bladder, thus preventing the activation of mast cells (which release pro-inflammatory mediators) For neuropathic pain/ nerve up-regulation, which results in inappropriately elevated pain signaling from the nerves in/around bladder to the brain: Prescription medications:  Such as:  Amitriptyline (Elavil), Nortriptyline (Pamelor), Trazodone , Cymbalta (Duloxetine), Gabapentin  (Neurontin ), Lyrica (Pregabalin)  IC treatment options for flare ups (for on-demand use): Urinary analgesics for burning / painful urination Over-the-counter medications: Phenazopyradine (Pyridium). Use should be limited to no more than 3 days consecutively due to risk for methemoglobinemia and damage to liver & bone health. Prescription medications: Uribel, Urogesic blue, Uro-MP, Urelle, etc. Can be take every 6 hours as needed May be cost prohibitive; not  covered well by some insurance providers Bladder instillations: Placement of a numbing medication (such as Lidocaine  + sodium bicarbonate or Bupivacaine ) through a catheter into the bladder. Can be done in-office at a nurse visit or at home Note: These medications are currently in short supply nationwide and may be difficult to obtain until the shortage improves. Can add on an over-the-counter antihistamine or TUMS (if not already taking)  Management options for high-tone pelvic floor dysfunction secondary to IC: Stretching, relaxation techniques (such as deep breathing/ meditation), yoga, avoidance of Kegels or other activities which strain or tighten pelvic floor muscles Pelvic floor physical therapy Oral muscle relaxer medications such as Baclofen (Lioresal), Methocarbamol (Robaxin), Tizanadine (Zanaflex), Cyclobenzaprine (Flexeril) Vaginal or rectal Diazepam (Valium) suppositories Trigger point injections Pelvic nerve blocks  Additional IC treatment options: Some patients find benefit from use of baking soda to reduce the acidity of the urine. Can dissolve half a teaspoon in a full glass of water and drink twice a day. Increased water intake can dilute urine concentration - may help by reducing urine acidity.  Supplements such as Bladder Renew, CystoProtek, Cysta-Q, Prelief, Freeze-dried aloe vera, Marshmallow root, etc. These have not been  evaluated by the FDA. Bladder Renew, CystoProtek, Cysta-Q: Sometimes used as an alternative to Elmiron. Prelief is an over the counter medication available at most pharmacies. Prelief works to remove acid from highly acidic foods such as tomato sauce, orange juice, coffee, and wine. Prelief can be added directly to these foods and helps to reduce bladder pain and urinary urgency. Cystoscopy with hydrodistention  a. This is an outpatient surgical procedure done in the OR under anesthesia for diagnostic benefit and  potential therapeutic benefit. It helps clinicians rule out other conditions, assess/ treat pain triggers such as Hunner's ulcers in the bladder, and evaluate bladder capacity (size). There is approximately 60% chance of symptomatic improvement with hydrodistention; the response can be quite variable. Rare surgical interventions Suprapubic catheter placement Bladder removal (cystectomy) with urinary diversion (such as ileal conduit, [place]  pouch, or neobladder creation)        Common triggers for IC flare ups: Certain beverages and foods which irritate the bladder This can be very patient specific See IC diet for list of common dietary irritants Physical stress Emotional / psychological stress Activities that put pressure on the pelvic area I perineum (such as sexual intercourse, long car rides, etc) Seasonal environmental allergens Urinary tract infection (UTI). You can call the urology office [phone]) to request lab order for urine testing if desired. May require antibiotic therapy if deemed appropriate based on urine culture result. Post-infectious (post-UTI) bladder inflammation. Can take several days or weeks to resolve.  IC treatment options for flare ups (for on-demand use):  Urinary analgesics for burning / painful urination Over-the-counter medications:  Phenazopyradine (Pyridium). Commonly known under the AZO brand name. Use should be limited to no more  than 3 days consecutively due to risk for methemoglobinemia and damage to liver & bone health. Prescription medications: Uribel, Urogesic blue, Uro-MP, Urelle, etc. Can be take every 6 hours as needed May be cost prohibitive; not covered well by some insurance providers Antihistamine use to minimize histamine-mediated bladder inflammation / pain If taking Hydroxyzine  (a prescription antihistamine), may temporarily increase dose at bedtime as directed by provider. Can add on an over-the-counter antihistamine (Zyrtec  or Xyzal preferred). Bladder instillations: Placement of a numbing medication (such as Lidocaine  + sodium bicarbonate or Bupivacaine ) through a catheter into the bladder.  b. Can be done in-office at  a nurse visit or at home.  c. Note: These medications are currently in short supply nationwide and may be difficult to obtain until the shortage improves.  Other: If symptoms fail to improve in a timely fashion, could also consider cystoscopy with hydrodistention for potential diagnostic and therapeutic benefit.        INTERSTITIAL CYSTITIS DIET  Many people with Interstitial Cystitis find that simple changes in their diet can help to control IC symptoms and avoid IC flare-ups. Typically, avoiding foods high in acid and potassium, as well as beverages containing caffeine and alcohol, is a good idea. This helpful guide can help you make "IC-Smart" meal choices. Keep it handy for easy reference when dining out or when preparing meals at home.  FRUITS: Allowable - Blueberries, Melons (except cantaloupe), Minute Maid Acid-Reduced Orange Juice (this can be diluted using water or Prelief* to prevent a flare-up) and Pears.  Avoidable -All other Fruits and Juices.  VEGETABLES:  Allowable - Homegrown Tomatoes, Acorn, Alfalfa, Beet, Broccoli, Bok Choy, [address], [place], [place], [place], [place], [place], [place], [place], [place], Mustard [place] and [employer].  Avoidable -  Store-bought Tomatoes, Onions, Tofu, Soybeans, Lima beans, Fava Beans, Asparagus, Beet Greens, [place], [place], [place], [place], [place], [place], Mushrooms, [place], [place], [place], [place], [employer] and Rhubarb.  MEATS/FISH: Allowable - Beef, Chicken, [place], [place], [address], [address], [place], [place], [place], [place], [place], [place], [place], Sole Mackerel, [place], [place] and Shrimp.  Avoidable - Chicken Liver, Corned Beef, Pickled Herring and Fresh Herring, Aged, [address], Cured, Processed or Smoked Meats/Fish, [place], [place], [address], [place], [place], [place], [place], [place], [place], [place] and meats that contain nitrates or nitrites.  CARBS/GRAINS:  Allowable - Pasta, Rice, Potatoes, White Bread, Bread made of Rice, Millet, Quinoa, Buckwheat, [place], Refined, Cooked or Ready to Eat Cereal.  Avoidable - Rye Bread, Sourdough Breads and Fresh Baked Yeast Products  FATS:    DAIRY: Allowable - Butter, Margarine, Vegetable Oils, Almonds, Cashews and Pine Nuts. Avoidable - Any other Nuts, Mayonnaise, Salad Dressing. Allowable - Milk, American Cheese, [address] Cream Cheese, String Cheese, [place], Ricotta Cheese, Frozen Yogurt and White Chocolate.   Avoidable - Yogurt, Goat's Milk, Chocolate Milk, Sour Cream, Aged Cheeses, Chocolate, Boursalt, Camembert, Cheddar, [address], [place], [place], [place], [place], [place], [place], [place], [place] and [place].  SOUPS: Allowable - Soups made with allowed Vegetables, Cream of Broccoli, Water Cress Soup, Cream of Cauliflower Soup.  Avoidable -Any packaged Soups.  SEASONINGS:  Allowable - Garlic and other seasonings not listed below.  Avoid - Miso, Soy Sauce, Vinegar and any Spicy Foods (especially [place], [place], [place] and [place] foods)  BEVERAGES:  Allowable - Bottled or Spring Water, Decaffeinated, Acid-free or Low Acid Coffee or Tea, Alfalfa Leaf Tea, Sun Tea, Kava Instant Coffee, Peppermint Tea, Golden Seal Tea, Flat Soda  and Low Acid Wines.  Avoid -Alcoholic Beverages (except Low Acid Wines), Carbonated Drinks such as Soda, Coffee and Tea (except what is listed above), Fruit Juices and Chamomile Tea.  PRESERVATIVE: Avoid - Benzol Alcohol, Citric Acid, Monosodium Glutamate (MSG), Aspartame, Saccharin and foods containing Artificial Ingredients/Colors.

## 2023-10-26 ENCOUNTER — Telehealth: Payer: Self-pay

## 2023-10-26 NOTE — Telephone Encounter (Signed)
 Decline- the dose was reduced

## 2023-10-26 NOTE — Telephone Encounter (Signed)
 Received fax requesting refill of    carbamazepine  (CARBATROL ) 300 MG 12 hr capsule    Preferred pharmacy  [id number] - [place],  - [address] Phone: [phone]  Fax: [phone]    Last visit 10-20-23 Next visit 12-04-23

## 2023-10-27 NOTE — Telephone Encounter (Signed)
 That would be great if they have any refills. Thanks.

## 2023-10-29 ENCOUNTER — Ambulatory Visit (HOSPITAL_COMMUNITY)
Admission: RE | Admit: 2023-10-29 | Discharge: 2023-10-29 | Disposition: A | Discharge status: Home / Self Care | Source: Ambulatory Visit | Attending: Urology | Admitting: Urology

## 2023-10-29 DIAGNOSIS — Z87440 Personal history of urinary (tract) infections: Secondary | ICD-10-CM | POA: Insufficient documentation

## 2023-10-29 DIAGNOSIS — R399 Unspecified symptoms and signs involving the genitourinary system: Secondary | ICD-10-CM | POA: Diagnosis present

## 2023-11-03 ENCOUNTER — Telehealth: Payer: Self-pay

## 2023-11-03 NOTE — Telephone Encounter (Signed)
 labwork order was prepared and ready to mail - pt notified

## 2023-11-03 NOTE — Telephone Encounter (Signed)
-----   Message from [place] sent at 10/22/2023  8:32 AM EDT ----- The patient's blood work shows a slightly low WBC count, which may be related to carbamazepine . As previously discussed, please advise her to reduce the dose to 200 mg twice daily.  Also, please let her know to repeat the blood test at [facility] within a month. In the meantime, if she develops any prolonged fever or signs of infection, please advise her to contact us .

## 2023-11-03 NOTE — Telephone Encounter (Signed)
 pt had already been notified with results and changes. i called pt to see how she was doing with the new dosage of medication. she stated that she was doing ok but she still having some twiching but it is not all the time. she also asked if i would mail her a copy of the labwork orders so that she have it just in case [facility] can not find at least she have that.
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
## 2023-11-13 ENCOUNTER — Encounter: Payer: Self-pay | Admitting: Psychiatry

## 2023-11-13 ENCOUNTER — Ambulatory Visit: Payer: Self-pay | Admitting: Psychiatry

## 2023-11-13 ENCOUNTER — Telehealth (INDEPENDENT_AMBULATORY_CARE_PROVIDER_SITE_OTHER): Admitting: Psychiatry

## 2023-11-13 DIAGNOSIS — F3181 Bipolar II disorder: Secondary | ICD-10-CM

## 2023-11-13 DIAGNOSIS — F411 Generalized anxiety disorder: Secondary | ICD-10-CM

## 2023-11-13 DIAGNOSIS — Z79899 Other long term (current) drug therapy: Secondary | ICD-10-CM | POA: Diagnosis not present

## 2023-11-13 DIAGNOSIS — R29818 Other symptoms and signs involving the nervous system: Secondary | ICD-10-CM | POA: Diagnosis not present

## 2023-11-13 LAB — CBC WITH DIFF/PLATELET
Basophils Absolute: 0 10*3/uL (ref 0.0–0.2)
Basos: 1 %
EOS (ABSOLUTE): 0.1 10*3/uL (ref 0.0–0.4)
Eos: 1 %
Hematocrit: 39 % (ref 34.0–46.6)
Hemoglobin: 12.9 g/dL (ref 11.1–15.9)
Immature Grans (Abs): 0 10*3/uL (ref 0.0–0.1)
Immature Granulocytes: 0 %
Lymphocytes Absolute: 2.1 10*3/uL (ref 0.7–3.1)
Lymphs: 45 %
MCH: 33 pg (ref 26.6–33.0)
MCHC: 33.1 g/dL (ref 31.5–35.7)
MCV: 100 fL — ABNORMAL HIGH (ref 79–97)
Monocytes Absolute: 0.5 10*3/uL (ref 0.1–0.9)
Monocytes: 11 %
Neutrophils Absolute: 2 10*3/uL (ref 1.4–7.0)
Neutrophils: 42 %
Platelets: 235 10*3/uL (ref 150–450)
RBC: 3.91 x10E6/uL (ref 3.77–5.28)
RDW: 12.1 % (ref 11.7–15.4)
WBC: 4.7 10*3/uL (ref 3.4–10.8)

## 2023-11-13 NOTE — Progress Notes (Signed)
 Please inform her that her white blood cell count has returned to the normal range. Advise her to continue taking the medication as recommended.

## 2023-11-13 NOTE — Patient Instructions (Signed)
 Continue carbamazepine  200 mg twice a day  Continue venlafaxine  100 mg twice a day Continue bupropion  100 mg twice a day  Continue lorazepam  1 mg daily as needed for anxiety  Next appointment: 6/6 at 10 am

## 2023-11-19 ENCOUNTER — Encounter: Payer: Self-pay | Admitting: Urology

## 2023-11-19 ENCOUNTER — Ambulatory Visit (INDEPENDENT_AMBULATORY_CARE_PROVIDER_SITE_OTHER): Admitting: Urology

## 2023-11-19 VITALS — BP 131/92 | HR 73

## 2023-11-19 DIAGNOSIS — N3010 Interstitial cystitis (chronic) without hematuria: Secondary | ICD-10-CM | POA: Insufficient documentation

## 2023-11-19 DIAGNOSIS — M6289 Other specified disorders of muscle: Secondary | ICD-10-CM | POA: Insufficient documentation

## 2023-11-19 DIAGNOSIS — Z87440 Personal history of urinary (tract) infections: Secondary | ICD-10-CM

## 2023-11-19 DIAGNOSIS — N952 Postmenopausal atrophic vaginitis: Secondary | ICD-10-CM | POA: Insufficient documentation

## 2023-11-19 DIAGNOSIS — R102 Pelvic and perineal pain: Secondary | ICD-10-CM | POA: Diagnosis not present

## 2023-11-19 LAB — URINALYSIS, ROUTINE W REFLEX MICROSCOPIC
Bilirubin, UA: NEGATIVE
Glucose, UA: NEGATIVE
Leukocytes,UA: NEGATIVE
Nitrite, UA: NEGATIVE
Protein,UA: NEGATIVE
RBC, UA: NEGATIVE
Specific Gravity, UA: 1.03 (ref 1.005–1.030)
Urobilinogen, Ur: 0.2 mg/dL (ref 0.2–1.0)
pH, UA: 6 (ref 5.0–7.5)

## 2023-11-19 LAB — BLADDER SCAN AMB NON-IMAGING: Scan Result: 0

## 2023-11-19 MED ORDER — BACLOFEN 10 MG PO TABS: ORAL_TABLET | ORAL | 2 refills | Status: DC

## 2023-11-19 NOTE — Progress Notes (Signed)
 Name: Sharon Merritt DOB: Nov 16, 1975 MRN: [id number]  History of Present Illness: Sharon Merritt is a 48 y.o. female who presents today for follow up visit at [facility] [place].  GU history: 1. Interstitial cystitis.  At initial visit on 10/22/2023: - Reported intermittent episodes of bladder pain, bladder/urethral pressure, increased urgency/frequency, urge incontinence, dysuria, straining to void, sensations of incomplete emptying. Occasional blood on toilet paper when wiping during flare ups.  - Advised the following: Requesting outside urine cultures and prior urology records. Start Hydroxyzine  25 mg nightly. Can add on daily morning OTC Zyrtec  or Xyzal during flare ups.  Renal / bladder ultrasound to assess for possible bladder / kidney stones.  Return in about 4 weeks (around 11/19/2023) for UA, PVR, & f/u with Griselda Lederer NP.  Since last visit: > 10/29/2023: RUS normal - no stones, masses, or hydronephrosis.   Today: She reports persistent bothersome bladder / urethral pressure.  She denies bothersome urinary urgency or frequency. Denies dysuria, gross hematuria, hesitancy, straining to void, or sensations of incomplete emptying.   Medications: Current Outpatient Medications  Medication Sig Dispense Refill   baclofen (LIORESAL) 10 MG tablet Take 1 tablet every night at bedtime. May also take during the day every 8 hours as needed (max daily dose is 3 tablets total in a 24 hour period). 90 tablet 2   buPROPion  ER (WELLBUTRIN  SR) 100 MG 12 hr tablet Take 1 tablet (100 mg total) by mouth 2 (two) times daily. 180 tablet 0   carbamazepine  (CARBATROL ) 200 MG 12 hr capsule Take 1 capsule (200 mg total) by mouth daily. (Patient taking differently: Take 200 mg by mouth 2 (two) times daily.) 90 capsule 0   carbamazepine  (CARBATROL ) 300 MG 12 hr capsule Take 1 capsule (300 mg total) by mouth 2 (two) times daily. 180 capsule 0   cetirizine  (ZYRTEC ) 10 MG tablet once.      fluticasone -salmeterol (ADVAIR  HFA) 115-21 MCG/ACT inhaler Inhale 2 puffs into the lungs 2 (two) times daily.     HYDROcodone -acetaminophen  (NORCO/VICODIN) 5-325 MG tablet Take 1 tablet by mouth every 6 (six) hours as needed. 10 tablet 0   hydrOXYzine  (ATARAX ) 25 MG tablet Take 1 tablet (25 mg total) by mouth at bedtime. 30 tablet 11   ibuprofen (ADVIL) 800 MG tablet Take by mouth as needed.     LORazepam  (ATIVAN ) 1 MG tablet Take 1 tablet (1 mg total) by mouth daily as needed for up to 30 doses for anxiety. 30 tablet 0   nitroGLYCERIN  (NITROSTAT ) 0.4 MG SL tablet Place 1 tablet (0.4 mg total) under the tongue every 5 (five) minutes as needed for chest pain. 10 tablet 1   omeprazole (PRILOSEC) 20 MG capsule Take 20 mg by mouth daily.     ondansetron  (ZOFRAN -ODT) 4 MG disintegrating tablet Dissolve 1 tablet (4 mg total) by mouth every 6 (six) hours as needed for nausea or vomiting. 20 tablet 0   pantoprazole  (PROTONIX ) 40 MG tablet Take 1 tablet (40 mg total) by mouth daily. 90 tablet 0   phenazopyridine (PYRIDIUM) 100 MG tablet once.     Semaglutide-Weight Management (WEGOVY [place]) once a week.     valACYclovir (VALTREX) 1000 MG tablet Take 1,000 mg by mouth daily as needed (Epstein-Barr).     venlafaxine  (EFFEXOR ) 100 MG tablet Take 1 tablet (100 mg total) by mouth 2 (two) times daily. 180 tablet 1   albuterol  (PROVENTIL ) (2.5 MG/3ML) 0.083% nebulizer solution as needed. (Patient not taking: Reported on 11/19/2023)  albuterol  (VENTOLIN  HFA) 108 (90 Base) MCG/ACT inhaler Inhale 2 puffs into the lungs daily as needed (Asthma). (Patient not taking: Reported on 10/22/2023)     No current facility-administered medications for this visit.    Allergies: No Known Allergies  Past Medical History:  Diagnosis Date   Anxiety    Asthma    Bipolar 1 disorder (HCC)    Breast discharge    Depression    Epstein Barr infection    Essential hypertension    Fibromyalgia    Lyme disease    Pneumonia     [place] spotted fever    Past Surgical History:  Procedure Laterality Date   LAPAROSCOPIC GASTRIC SLEEVE RESECTION N/A 04/23/2021   Procedure: LAPAROSCOPIC GASTRIC SLEEVE RESECTION;  Surgeon: Dorrie Gaudier, Alphonso Aschoff, MD;  Location: [facility];  Service: General;  Laterality: N/A;   TUBAL LIGATION     UPPER GI ENDOSCOPY N/A 04/23/2021   Procedure: UPPER GI ENDOSCOPY;  Surgeon: Dorrie Gaudier, Alphonso Aschoff, MD;  Location: [facility];  Service: General;  Laterality: N/A;   WISDOM TOOTH EXTRACTION     Family History  Problem Relation Age of Onset   COPD Mother    Depression Mother    Diabetes Father    Hypertension Father    Stroke Father    Bipolar disorder Sister    Post-traumatic stress disorder Sister    Alcohol abuse Sister    Drug abuse Sister    Pulmonary embolism Sister    Bipolar disorder Cousin    Schizophrenia Cousin    Social History   Socioeconomic History   Marital status: Widowed    Spouse name: Bearl Limes    Number of children: 3   Years of education: Assoc    Highest education level: Not on file  Occupational History    Employer: [employer]  Tobacco Use   Smoking status: Former    Current packs/day: 0.00    Types: Cigarettes    Quit date: 02/14/2009    Years since quitting: 14.7   Smokeless tobacco: Never  Vaping Use   Vaping status: Never Used  Substance and Sexual Activity   Alcohol use: Not Currently    Alcohol/week: 0.0 standard drinks of alcohol    Comment: Rarely    Drug use: No   Sexual activity: Not Currently    Birth control/protection: Surgical  Other Topics Concern   Not on file  Social History Narrative   Patient lives at home with her husband Bearl Limes) and her children.    Patient works full time.   Caffeine- Tea 3-4 times daily.   Patient is right-handed.   Patient has a Scientist, research (physical sciences).         Social Drivers of Corporate investment banker Strain: Not on file  Food Insecurity: Not on file  Transportation Needs: Not on file   Physical Activity: Not on file  Stress: Not on file  Social Connections: Not on file  Intimate Partner Violence: Not on file    Review of Systems Constitutional: Patient denies any unintentional weight loss or change in strength lntegumentary: Patient denies any rashes or pruritus Cardiovascular: Patient denies chest pain or syncope Respiratory: Patient denies shortness of breath Gastrointestinal: Patient reports alternating constipation and diarrhea Musculoskeletal: Patient reports bilateral hip and low back pain; reports she has surgery scheduled 01/06/2024 for left hip replacement  Neurologic: Patient denies convulsions or seizures Allergic/Immunologic: Patient denies recent allergic reaction(s) Hematologic/Lymphatic: Patient denies bleeding tendencies Endocrine: Patient denies heat/cold intolerance  GU:  As per HPI.  OBJECTIVE Vitals:   11/19/23 1253  BP: (!) 131/92  Pulse: 73   There is no height or weight on file to calculate BMI.  Physical Examination Constitutional: No obvious distress; patient is non-toxic appearing  Cardiovascular: No visible lower extremity edema.  Respiratory: The patient does not have audible wheezing/stridor; respirations do not appear labored  Gastrointestinal: Abdomen non-distended Musculoskeletal: Normal ROM of UEs  Skin: No obvious rashes/open sores  Neurologic: CN 2-12 grossly intact Psychiatric: Answered questions appropriately with normal affect  Hematologic/Lymphatic/Immunologic: No obvious bruises or sites of spontaneous bleeding  Pelvic Exam: External genitalia  normal in appearance and negative for erythema, lesions or discharge. Bartholin's and Skene's glands  normal in appearance. She has normal sensation to light touch.  Urethral meatus midline and negative  for discharge, polyps, prolapse, caruncles, masses, or erythema. Urethra negative  for tenderness, masses, or leak with cough. Mild urethral hypermobility. Bladder positive   for tenderness or distention. Vagina negative  for discharge, irritation or lesions. Atrophic. No prolapse.   Pelvic floor is significantly hypertonic. Pelvic floor is tender to palpation.  Rectal Exam: Anal verge looks normal without hemorrhoids.  Chaperone offered for pelvic exam; patient declined.  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 We discussed management options including relaxation techniques, yoga, stretching, muscle relaxers, neuropathic pain medications, and/or pelvic floor physical therapy. We agreed to proceed with referral to pelvic floor physical therapy and trial of muscle relaxer (Baclofen 10 mg) every 8 hours PRN including routine use nightly.  We agreed to plan for follow up in 3 months or sooner if needed. Patient verbalized understanding of and agreement with current plan. All questions were answered.  PLAN Advised the following: 1. For high-tone pelvic floor dysfunction:  - Baclofen 10 mg every 8 hours PRN including routine nightly use.  - Referred to pelvic floor physical therapy.  2. For interstitial cystitis: - Continue Hydroxyzine  25 mg nightly 3. Return in about 3 months (around 02/19/2024) for UA, PVR, & f/u with Griselda Lederer NP.  Orders Placed This Encounter  Procedures   Urinalysis, Routine w reflex microscopic   Ambulatory referral to Physical Therapy    Referral Priority:   Routine    Referral Type:   Physical Medicine    Referral Reason:   Specialty Services  Required    Requested Specialty:   Physical Therapy    Number of Visits Requested:   1   BLADDER SCAN AMB NON-IMAGING    It has been explained that the patient is to follow regularly with their PCP in addition to all other providers involved in their care and to follow instructions provided by these respective offices. Patient advised to contact urology clinic if any urologic-pertaining questions, concerns, new symptoms or problems arise in the interim period.  There are no Patient Instructions on file for this visit.  Electronically signed by:  Lauretta Ponto, FNP   11/19/23    2:44 PM
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
## 2023-12-04 ENCOUNTER — Telehealth: Admitting: Psychiatry

## 2023-12-04 ENCOUNTER — Encounter: Payer: Self-pay | Admitting: Psychiatry

## 2023-12-04 DIAGNOSIS — F3181 Bipolar II disorder: Secondary | ICD-10-CM

## 2023-12-04 DIAGNOSIS — F411 Generalized anxiety disorder: Secondary | ICD-10-CM | POA: Diagnosis not present

## 2023-12-04 DIAGNOSIS — R29818 Other symptoms and signs involving the nervous system: Secondary | ICD-10-CM | POA: Diagnosis not present

## 2023-12-04 MED ORDER — BUPROPION HCL ER (SR) 150 MG PO TB12: 150.0000 mg | ORAL_TABLET | Freq: Two times a day (BID) | ORAL | 0 refills | Status: DC

## 2023-12-06 DIAGNOSIS — F419 Anxiety disorder, unspecified: Secondary | ICD-10-CM

## 2023-12-08 MED ORDER — LORAZEPAM 1 MG PO TABS: 1.0000 mg | ORAL_TABLET | Freq: Every day | ORAL | 0 refills | Status: DC | PRN

## 2023-12-09 ENCOUNTER — Other Ambulatory Visit (HOSPITAL_COMMUNITY): Payer: Self-pay | Admitting: Nurse Practitioner

## 2023-12-09 DIAGNOSIS — Z1231 Encounter for screening mammogram for malignant neoplasm of breast: Secondary | ICD-10-CM
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
## 2023-12-14 DIAGNOSIS — M1991 Primary osteoarthritis, unspecified site: Secondary | ICD-10-CM | POA: Insufficient documentation

## 2023-12-15 ENCOUNTER — Ambulatory Visit (INDEPENDENT_AMBULATORY_CARE_PROVIDER_SITE_OTHER): Admitting: Neurology

## 2023-12-15 ENCOUNTER — Encounter: Payer: Self-pay | Admitting: Neurology

## 2023-12-15 VITALS — BP 124/83 | HR 73 | Ht 66.0 in | Wt 149.2 lb

## 2023-12-15 DIAGNOSIS — G43709 Chronic migraine without aura, not intractable, without status migrainosus: Secondary | ICD-10-CM

## 2023-12-15 MED ORDER — ONDANSETRON 4 MG PO TBDP: 4.0000 mg | ORAL_TABLET | Freq: Three times a day (TID) | ORAL | 6 refills | Status: DC | PRN

## 2023-12-15 MED ORDER — RIZATRIPTAN BENZOATE 10 MG PO TBDP: 10.0000 mg | ORAL_TABLET | ORAL | 6 refills | Status: DC | PRN

## 2023-12-15 NOTE — Progress Notes (Signed)
 Chief Complaint  Patient presents with   New Patient (Initial Visit)    Rm15, alone, NX sumner/Sharon Merritt 2014/[employer] Proficient/[place] office/Michelle Howell NP [phone]/migraines, possible trigeminal neuralgia: 2 -3 DAYS WITH MIGRAINE. TRIGGERS: UNIDENTIFIABLE. Sometimes before a migraine comes on, pt gets so so sleepy. Pt stated that she has a chemical smell for the past year.       ASSESSMENT AND PLAN  Sharon Merritt is a 48 y.o. female   Chronic migraine headache  Maxalt as needed  May mix with Zofran  Aleve  for severe prolonged headache   DIAGNOSTIC DATA (LABS, IMAGING, TESTING) - I reviewed patient records, labs, notes, testing and imaging myself where available.   MEDICAL HISTORY:  Sharon Merritt, is a 48 year old female seen in request by her primary care doctor Denman Fischer for evaluation of headache, initial evaluation was on December 15, 2023  History is obtained from the patient and review of electronic medical records. I personally reviewed pertinent available imaging films in PACS.   PMHx of  Bipolar,  [facility] Spotted Fever Lyme disease, was treated with prolonged antibiotics, Gastric sleeve resection in Oct 2022, lost 50 Lb.  She report history of [facility] spotted fever, also Lyme disease, was treated with extended period of antibiotic from 2016-2017, during that period of time, she  was sick like a dog, complains of severe fatigue, could hardly get out of the bed, as if my soul is leaving my body,   Treatment did help her, she was able to function again, around that time she began to notice frequent headache, now it happened couple times each month, lateralized retro-orbital area pain, tends to happen on the right side, pounding, light noise sensitivity, nauseous, pain also extending to her neck, shoulder, it can last hours or days  She had tried Motrin without helping her, was given a sample of Florette Hurry, was not sure about the benefit, she has such  severe nausea, vomit the medicine out   MRI of brain wo in August 2020, was normal.  PHYSICAL EXAM:   Vitals:   12/15/23 1550  BP: 124/83  Pulse: 73  Weight: 149 lb 3.2 oz (67.7 kg)  Height: 5' 6 (1.676 m)     Body mass index is 24.08 kg/m.  PHYSICAL EXAMNIATION:  Gen: NAD, conversant, well nourised, well groomed                     Cardiovascular: Regular rate rhythm, no peripheral edema, warm, nontender. Eyes: Conjunctivae clear without exudates or hemorrhage Neck: Supple, no carotid bruits. Pulmonary: Clear to auscultation bilaterally   NEUROLOGICAL EXAM:  MENTAL STATUS: Speech/cognition: Awake, alert, oriented to history taking and casual conversation CRANIAL NERVES: CN II: Visual fields are full to confrontation. Pupils are round equal and briskly reactive to light.  Funduscopy examination were normal bilaterally CN III, IV, VI: extraocular movement are normal. No ptosis. CN V: Facial sensation is intact to light touch CN VII: Face is symmetric with normal eye closure  CN VIII: Hearing is normal to causal conversation. CN IX, X: Phonation is normal. CN XI: Head turning and shoulder shrug are intact  MOTOR: There is no pronator drift of out-stretched arms. Muscle bulk and tone are normal. Muscle strength is normal.  REFLEXES: Reflexes are 2+ and symmetric at the biceps, triceps, knees, and ankles. Plantar responses are flexor.  SENSORY: Intact to light touch, pinprick and vibratory sensation are intact in fingers and toes.  COORDINATION: There is no trunk or limb  dysmetria noted.  GAIT/STANCE: Posture is normal. Gait is steady   REVIEW OF SYSTEMS:  Full 14 system review of systems performed and notable only for as above All other review of systems were negative.   ALLERGIES: No Known Allergies  HOME MEDICATIONS: Current Outpatient Medications  Medication Sig Dispense Refill   albuterol  (PROVENTIL ) (2.5 MG/3ML) 0.083% nebulizer solution as needed.      albuterol  (VENTOLIN  HFA) 108 (90 Base) MCG/ACT inhaler Inhale 2 puffs into the lungs daily as needed (Asthma).     baclofen  (LIORESAL ) 10 MG tablet Take 1 tablet every night at bedtime. May also take during the day every 8 hours as needed (max daily dose is 3 tablets total in a 24 hour period). 90 tablet 2   buPROPion  (WELLBUTRIN  SR) 150 MG 12 hr tablet Take 1 tablet (150 mg total) by mouth 2 (two) times daily. 60 tablet 0   carbamazepine  (CARBATROL ) 200 MG 12 hr capsule Take 1 capsule (200 mg total) by mouth daily. (Patient taking differently: Take 200 mg by mouth 2 (two) times daily.) 90 capsule 0   carbamazepine  (CARBATROL ) 300 MG 12 hr capsule Take 1 capsule (300 mg total) by mouth 2 (two) times daily. 180 capsule 0   cetirizine  (ZYRTEC ) 10 MG tablet once.     fluticasone -salmeterol (ADVAIR  HFA) 115-21 MCG/ACT inhaler Inhale 2 puffs into the lungs 2 (two) times daily.     HYDROcodone -acetaminophen  (NORCO/VICODIN) 5-325 MG tablet Take 1 tablet by mouth every 6 (six) hours as needed. 10 tablet 0   hydrOXYzine  (ATARAX ) 25 MG tablet Take 1 tablet (25 mg total) by mouth at bedtime. 30 tablet 11   ibuprofen (ADVIL) 800 MG tablet Take by mouth as needed.     LORazepam  (ATIVAN ) 1 MG tablet Take 1 tablet (1 mg total) by mouth daily as needed for up to 15 doses for anxiety. 15 tablet 0   ondansetron  (ZOFRAN -ODT) 4 MG disintegrating tablet Dissolve 1 tablet (4 mg total) by mouth every 6 (six) hours as needed for nausea or vomiting. 20 tablet 0   pantoprazole  (PROTONIX ) 40 MG tablet Take 1 tablet (40 mg total) by mouth daily. 90 tablet 0   phenazopyridine (PYRIDIUM) 100 MG tablet once.     Semaglutide-Weight Management (WEGOVY [place]) once a week.     valACYclovir (VALTREX) 1000 MG tablet Take 1,000 mg by mouth daily as needed (Epstein-Barr).     venlafaxine  (EFFEXOR ) 100 MG tablet Take 1 tablet (100 mg total) by mouth 2 (two) times daily. 180 tablet 1   No current facility-administered medications for  this visit.    PAST MEDICAL HISTORY: Past Medical History:  Diagnosis Date   Anxiety    Asthma    Bipolar 1 disorder (HCC)    Breast discharge    Depression    Epstein Barr infection    Essential hypertension    Fibromyalgia    Lyme disease    Pneumonia    [place] spotted fever     PAST SURGICAL HISTORY: Past Surgical History:  Procedure Laterality Date   LAPAROSCOPIC GASTRIC SLEEVE RESECTION N/A 04/23/2021   Procedure: LAPAROSCOPIC GASTRIC SLEEVE RESECTION;  Surgeon: Dorrie Gaudier, Alphonso Aschoff, MD;  Location: [facility];  Service: General;  Laterality: N/A;   TUBAL LIGATION     UPPER GI ENDOSCOPY N/A 04/23/2021   Procedure: UPPER GI ENDOSCOPY;  Surgeon: Dorrie Gaudier Alphonso Aschoff, MD;  Location: [facility];  Service: General;  Laterality: N/A;   WISDOM TOOTH EXTRACTION      FAMILY  HISTORY: Family History  Problem Relation Age of Onset   COPD Mother    Depression Mother    Diabetes Father    Hypertension Father    Stroke Father    Bipolar disorder Sister    Post-traumatic stress disorder Sister    Alcohol abuse Sister    Drug abuse Sister    Pulmonary embolism Sister    Bipolar disorder Cousin    Schizophrenia Cousin     SOCIAL HISTORY: Social History   Socioeconomic History   Marital status: Widowed    Spouse name: Bearl Limes    Number of children: 3   Years of education: Assoc    Highest education level: Not on file  Occupational History    Employer: Publishing copy  Tobacco Use   Smoking status: Former    Current packs/day: 0.00    Types: Cigarettes    Quit date: 02/14/2009    Years since quitting: 14.8   Smokeless tobacco: Never  Vaping Use   Vaping status: Never Used  Substance and Sexual Activity   Alcohol use: Not Currently    Alcohol/week: 0.0 standard drinks of alcohol    Comment: Rarely    Drug use: No   Sexual activity: Not Currently    Birth control/protection: Surgical  Other Topics Concern   Not on file  Social History Narrative   Patient  lives at home with her husband Bearl Limes) and her children.    Patient works full time.   Caffeine- Tea 3-4 times daily.   Patient is right-handed.   Patient has a Scientist, research (physical sciences).         Social Drivers of Corporate investment banker Strain: Not on file  Food Insecurity: Not on file  Transportation Needs: Not on file  Physical Activity: Not on file  Stress: Not on file  Social Connections: Not on file  Intimate Partner Violence: Not on file      Phebe Brasil, M.D. Ph.D.  [facility] [address], [address] [place], [place] [address] Ph: [phone] Fax: [phone]  CC:  Harriet Limber, FNP [address] [place],  [place] [address]  Omie Bickers, MD

## 2023-12-18 ENCOUNTER — Telehealth: Admitting: Psychiatry

## 2023-12-18 ENCOUNTER — Encounter: Payer: Self-pay | Admitting: Psychiatry

## 2023-12-18 DIAGNOSIS — R29818 Other symptoms and signs involving the nervous system: Secondary | ICD-10-CM

## 2023-12-18 DIAGNOSIS — F3181 Bipolar II disorder: Secondary | ICD-10-CM

## 2023-12-18 DIAGNOSIS — F411 Generalized anxiety disorder: Secondary | ICD-10-CM

## 2023-12-18 MED ORDER — CARBAMAZEPINE ER 200 MG PO CP12: 200.0000 mg | ORAL_CAPSULE | Freq: Two times a day (BID) | ORAL | 0 refills | Status: DC

## 2023-12-18 MED ORDER — QUETIAPINE FUMARATE 25 MG PO TABS: 25.0000 mg | ORAL_TABLET | Freq: Every day | ORAL | 1 refills | Status: DC

## 2023-12-18 MED ORDER — BUPROPION HCL ER (SR) 150 MG PO TB12: 150.0000 mg | ORAL_TABLET | Freq: Two times a day (BID) | ORAL | 0 refills | Status: DC

## 2023-12-21 ENCOUNTER — Ambulatory Visit (HOSPITAL_COMMUNITY)
Admission: RE | Admit: 2023-12-21 | Discharge: 2023-12-21 | Disposition: A | Discharge status: Home / Self Care | Source: Ambulatory Visit | Attending: Nurse Practitioner | Admitting: Nurse Practitioner

## 2023-12-21 DIAGNOSIS — Z1231 Encounter for screening mammogram for malignant neoplasm of breast: Secondary | ICD-10-CM | POA: Insufficient documentation

## 2023-12-29 ENCOUNTER — Encounter (INDEPENDENT_AMBULATORY_CARE_PROVIDER_SITE_OTHER)

## 2023-12-29 DIAGNOSIS — F3131 Bipolar disorder, current episode depressed, mild: Secondary | ICD-10-CM | POA: Diagnosis not present

## 2023-12-29 MED ORDER — QUETIAPINE FUMARATE 100 MG PO TABS: 100.0000 mg | ORAL_TABLET | Freq: Every day | ORAL | 0 refills | Status: DC

## 2023-12-29 NOTE — Telephone Encounter (Signed)
 Called the patient in response to my chart message.   She feels that her mind is clear since discontinuation of carbamazepine .  Although she used to struggle with fatigue, she now feels normal and more focused.  She has been able to do things more easily.  She has been taking quetiapine  without any side effects. (According to the chart review, she was experiencing fatigue prior to starting carbamazepine  in Sept 2024)  It is discussed with the patient that although carbamazepine  will be discontinued to mitigate any side effect, she is at the very low dose of quetiapine  for bipolar disorder.  She expressed understanding that the hope is to help titrate to 400 mg while monitoring any side effect.  She agrees with the following.   Increase quetiapine  50 mg at night for 3 days, then 100 mg at night  Discussed potential risk of metabolic side effect, EPS. Will monitor QTc prolongation.   She agrees to contact the office if any concerning side effects.  Psychoeducation was also provided regarding the symptoms of mania.  She will notify the office if she experiences any of these symptoms.   I've spent a total time of 10 minutes providing service to this patient-generated inquiry in the MyChart message   Persons participated in the visit- patient (home), provider (office)

## 2024-01-05 ENCOUNTER — Other Ambulatory Visit: Payer: Self-pay | Admitting: Psychiatry

## 2024-01-05 DIAGNOSIS — F419 Anxiety disorder, unspecified: Secondary | ICD-10-CM
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
## 2024-01-27 ENCOUNTER — Telehealth: Admitting: Psychiatry

## 2024-01-28 ENCOUNTER — Encounter: Admitting: Obstetrics and Gynecology

## 2024-01-28 NOTE — Progress Notes (Deleted)
 Sharon Norleen PEDLAR, MD   No chief complaint on file.   HPI:      [name] is a 48 y.o. No obstetric history on file. whose LMP was No LMP recorded. 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 11/19/2023   Atrophic vaginitis 11/19/2023   Pelvic pain in female 11/19/2023   Interstitial cystitis 11/19/2023   Migraine 08/18/2023   Genital herpes simplex 02/04/2023   Mild intermittent asthma 10/14/2021   Mixed hyperlipidemia 10/14/2021   Prediabetes 10/10/2021   Bipolar 2 disorder (HCC) 07/13/2019   Myalgia 05/19/2019   Asthma 05/18/2019   MDD (major depressive disorder) 10/25/2018   GAD (generalized anxiety disorder) 10/25/2018   Chronic insomnia 10/25/2018   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) 11/04/2016   Class 2 obesity 11/04/2016   Abnormal uterine bleeding 10/14/2016   Cyst of left ovary 10/14/2016   DDD (degenerative disc disease), cervical 10/09/2014   Fatigue 10/09/2014   Fibromyalgia    Hypertension    GERD (gastroesophageal reflux disease) 02/14/2013    Past Surgical History:  Procedure Laterality Date   LAPAROSCOPIC GASTRIC SLEEVE RESECTION N/A 04/23/2021   Procedure: LAPAROSCOPIC GASTRIC SLEEVE RESECTION;  Surgeon: Stevie Herlene Righter, MD;  Location: [facility];  Service: General;  Laterality: N/A;   TUBAL LIGATION     UPPER GI ENDOSCOPY N/A 04/23/2021   Procedure: UPPER GI ENDOSCOPY;  Surgeon: Stevie, Herlene Righter, MD;  Location: [facility];  Service: General;  Laterality: N/A;   WISDOM TOOTH EXTRACTION      Family History  Problem Relation Age of Onset   COPD Mother    Depression Mother    Diabetes Father    Hypertension Father    Stroke Father    Bipolar disorder Sister     Post-traumatic stress disorder Sister    Alcohol abuse Sister    Drug abuse Sister    Pulmonary embolism Sister    Bipolar disorder Cousin    Schizophrenia Cousin     Social History   Socioeconomic History   Marital status: Widowed    Spouse name: Toribio    Number of children: 3   Years of education: Assoc    Highest education level: Not on file  Occupational History    Employer: [employer]  Tobacco Use   Smoking status: Former    Current packs/day: 0.00    Types: Cigarettes    Quit date: 02/14/2009    Years since quitting: 14.9   Smokeless tobacco: Never  Vaping Use   Vaping status: Never Used  Substance and Sexual Activity   Alcohol use: Not Currently    Alcohol/week: 0.0 standard drinks of alcohol    Comment: Rarely    Drug use: No   Sexual activity: Not Currently    Birth control/protection: Surgical  Other Topics Concern   Not on file  Social History Narrative   Patient lives at home with her husband Honey) and her children.    Patient works full time.   Caffeine- Tea 3-4 times daily.   Patient is right-handed.   Patient has a Scientist, research (physical sciences).         Social Drivers of Corporate investment banker Strain: Not on file  Food Insecurity: Not on file  Transportation Needs: Not on file  Physical Activity: Not on file  Stress: Not on file  Social Connections: Not on file  Intimate Partner Violence: Not on file    Outpatient Medications Prior to Visit  Medication Sig Dispense Refill   albuterol  (PROVENTIL ) (2.5 MG/3ML) 0.083% nebulizer solution as needed.     albuterol  (VENTOLIN  HFA) 108 (90 Base) MCG/ACT inhaler Inhale 2 puffs into the lungs daily as needed (Asthma).     baclofen  (LIORESAL ) 10 MG tablet Take 1 tablet every night at bedtime. May also take during the day every 8 hours as needed (max daily dose is 3 tablets total in a 24 hour period). 90 tablet 2   buPROPion  (WELLBUTRIN  SR) 150 MG 12 hr tablet Take 1 tablet (150 mg total) by mouth 2  (two) times daily. 60 tablet 0   cetirizine  (ZYRTEC ) 10 MG tablet once.     fluticasone -salmeterol (ADVAIR  HFA) 115-21 MCG/ACT inhaler Inhale 2 puffs into the lungs 2 (two) times daily.     HYDROcodone -acetaminophen  (NORCO/VICODIN) 5-325 MG tablet Take 1 tablet by mouth every 6 (six) hours as needed. 10 tablet 0   hydrOXYzine  (ATARAX ) 25 MG tablet Take 1 tablet (25 mg total) by mouth at bedtime. 30 tablet 11   ibuprofen (ADVIL) 800 MG tablet Take by mouth as needed.     LORazepam  (ATIVAN ) 1 MG tablet Take 1 tablet (1 mg total) by mouth daily as needed for up to 30 doses for anxiety. 30 tablet 0   ondansetron  (ZOFRAN -ODT) 4 MG disintegrating tablet Dissolve 1 tablet (4 mg total) by mouth every 6 (six) hours as needed for nausea or vomiting. 20 tablet 0   ondansetron  (ZOFRAN -ODT) 4 MG disintegrating tablet Take 1 tablet (4 mg total) by mouth every 8 (eight) hours as needed. 20 tablet 6   pantoprazole  (PROTONIX ) 40 MG tablet Take 1 tablet (40 mg total) by mouth daily. 90 tablet 0   phenazopyridine (PYRIDIUM) 100 MG tablet once.     QUEtiapine  (SEROQUEL ) 100 MG tablet Take 1 tablet (100 mg total) by mouth at bedtime. 30 tablet 0   QUEtiapine  (SEROQUEL ) 25 MG tablet Take 1 tablet (25 mg total) by mouth at bedtime. 30 tablet 1   rizatriptan  (MAXALT -MLT) 10 MG disintegrating tablet Take 1 tablet (10 mg total) by mouth as needed. May repeat in 2 hours if needed 12 tablet 6   Semaglutide-Weight Management (WEGOVY [place]) once a week.     valACYclovir (VALTREX) 1000 MG tablet Take 1,000 mg by mouth daily as needed (Epstein-Barr).     venlafaxine  (EFFEXOR ) 100 MG tablet Take 1 tablet (100 mg total) by mouth 2 (two) times daily. 180 tablet 1   No facility-administered medications prior to visit.      ROS:  Review of Systems BREAST: No symptoms   OBJECTIVE:   Vitals:  There were no vitals taken for this visit.  Physical Exam  Results: No results found for this or any previous visit (from the past  24 hours).   Assessment/Plan: No diagnosis found.    No orders of the defined types were placed in this encounter.     No follow-ups on file.  Flor Whitacre B. Janese Radabaugh, PA-C 01/28/2024 8:25 AM

## 2024-02-16 ENCOUNTER — Other Ambulatory Visit: Payer: Self-pay | Admitting: Psychiatry

## 2024-02-16 ENCOUNTER — Telehealth: Payer: Self-pay

## 2024-02-16 MED ORDER — QUETIAPINE FUMARATE 100 MG PO TABS: 100.0000 mg | ORAL_TABLET | Freq: Every day | ORAL | 0 refills | Status: DC

## 2024-02-16 MED ORDER — BUPROPION HCL ER (SR) 150 MG PO TB12: 150.0000 mg | ORAL_TABLET | Freq: Two times a day (BID) | ORAL | 0 refills | Status: DC
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
## 2024-02-16 NOTE — Telephone Encounter (Signed)
received fax requesting a refill on the quetiapine 25mg 

## 2024-02-16 NOTE — Telephone Encounter (Signed)
left message that rxs' have been sent to the pharmacy

## 2024-02-16 NOTE — Telephone Encounter (Signed)
 Quetiapine  100 mg tab was ordered instead as the dose was uptitrated

## 2024-02-16 NOTE — Telephone Encounter (Signed)
 I missed the previous message- bupropion  is sent in as well.

## 2024-02-16 NOTE — Telephone Encounter (Signed)
 Did you also send in the bupropion ?

## 2024-02-16 NOTE — Telephone Encounter (Signed)
 received fax requesting a refill on the bupropion  sr 150mg  pt was last seen on 6-20 next appt 9-10

## 2024-02-17 ENCOUNTER — Telehealth (INDEPENDENT_AMBULATORY_CARE_PROVIDER_SITE_OTHER): Admitting: Psychiatry

## 2024-02-17 ENCOUNTER — Encounter: Payer: Self-pay | Admitting: Psychiatry

## 2024-02-17 DIAGNOSIS — F419 Anxiety disorder, unspecified: Secondary | ICD-10-CM

## 2024-02-17 DIAGNOSIS — F3181 Bipolar II disorder: Secondary | ICD-10-CM

## 2024-02-17 DIAGNOSIS — R29818 Other symptoms and signs involving the nervous system: Secondary | ICD-10-CM

## 2024-02-17 MED ORDER — QUETIAPINE FUMARATE 200 MG PO TABS: 200.0000 mg | ORAL_TABLET | Freq: Every day | ORAL | 1 refills | Status: DC

## 2024-02-17 MED ORDER — VENLAFAXINE HCL 100 MG PO TABS: 100.0000 mg | ORAL_TABLET | Freq: Two times a day (BID) | ORAL | 1 refills | Status: DC

## 2024-02-17 NOTE — Patient Instructions (Signed)
 Increase quetiapine  200 mg at night  Continue venlafaxine  100 mg twice a day Continue bupropion  150 mg twice a day  Continue lorazepam  1 mg daily as needed for anxiety  Next appointment: 10/16 at 4 30

## 2024-02-23 ENCOUNTER — Ambulatory Visit: Admitting: Urology

## 2024-02-28 ENCOUNTER — Other Ambulatory Visit: Payer: Self-pay | Admitting: Psychiatry

## 2024-02-28 DIAGNOSIS — F419 Anxiety disorder, unspecified: Secondary | ICD-10-CM

## 2024-03-01 ENCOUNTER — Ambulatory Visit
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
## 2024-03-09 ENCOUNTER — Telehealth (INDEPENDENT_AMBULATORY_CARE_PROVIDER_SITE_OTHER): Admitting: Psychiatry

## 2024-03-09 ENCOUNTER — Encounter: Payer: Self-pay | Admitting: Psychiatry

## 2024-03-09 DIAGNOSIS — G2401 Drug induced subacute dyskinesia: Secondary | ICD-10-CM

## 2024-03-09 DIAGNOSIS — F419 Anxiety disorder, unspecified: Secondary | ICD-10-CM | POA: Diagnosis not present

## 2024-03-09 DIAGNOSIS — F3181 Bipolar II disorder: Secondary | ICD-10-CM | POA: Diagnosis not present

## 2024-03-09 NOTE — Patient Instructions (Signed)
 Increase quetiapine  250 mg at night  Continue venlafaxine  100 mg twice a day Continue bupropion  150 mg twice a day  Continue lorazepam  1 mg daily as needed for anxiety  Next appointment: 10/16 at 4 30

## 2024-03-10 ENCOUNTER — Other Ambulatory Visit: Payer: Self-pay | Admitting: Psychiatry

## 2024-03-10 MED ORDER — QUETIAPINE FUMARATE 50 MG PO TABS: 50.0000 mg | ORAL_TABLET | Freq: Every day | ORAL | 1 refills | Status: DC

## 2024-03-22 ENCOUNTER — Ambulatory Visit (INDEPENDENT_AMBULATORY_CARE_PROVIDER_SITE_OTHER): Admitting: Obstetrics and Gynecology

## 2024-03-22 ENCOUNTER — Encounter: Payer: Self-pay | Admitting: Obstetrics and Gynecology

## 2024-03-22 VITALS — BP 113/81 | HR 81 | Ht 66.0 in | Wt 147.0 lb

## 2024-03-22 DIAGNOSIS — R5382 Chronic fatigue, unspecified: Secondary | ICD-10-CM

## 2024-03-22 DIAGNOSIS — R6882 Decreased libido: Secondary | ICD-10-CM

## 2024-03-22 MED ORDER — AMBULATORY NON FORMULARY MEDICATION: 2 refills | Status: AC

## 2024-03-22 NOTE — Progress Notes (Signed)
 Sharon Norleen PEDLAR, MD   Chief Complaint  Patient presents with   Referral    Tired all the time, no libido, crying    HPI:      Sharon Merritt is a 48 y.o. [phone] whose LMP was Patient's last menstrual period was 03/29/2021., presents today for NP eval HRT. LMP about 4 yrs ago, no PMB. Has some night sweats, no hot flashes. Hx of fatigue for over a year; has trouble staying asleep, but recently started baclofen  for IC and is sleeping better now. Sometimes has extreme fatigue when driving or at work and needs to stop. Is on mult meds that could be causing this. No improvement since starting baclofen  but this has been recent med. Essentially normal labs with PCP 4/25. Also with decreased libido. Hx of bipolar, GAD, MDD; on mult meds that could be affecting libido. No pain/bleeding/dryness with sex. Neg pap 1/21. Sees behavioral health for bipolar, GAD/MDD. Has crying episodes.   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 11/19/2023   Atrophic vaginitis 11/19/2023   Pelvic pain in female 11/19/2023   Interstitial cystitis 11/19/2023   Migraine 08/18/2023   Genital herpes simplex 02/04/2023   Mild intermittent asthma 10/14/2021   Mixed hyperlipidemia 10/14/2021   Prediabetes 10/10/2021   Bipolar 2 disorder (HCC) 07/13/2019   Myalgia 05/19/2019   Asthma 05/18/2019   MDD (major depressive disorder) 10/25/2018   GAD (generalized anxiety disorder) 10/25/2018   Chronic insomnia 10/25/2018   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) 11/04/2016   Class 2 obesity 11/04/2016   Abnormal uterine bleeding 10/14/2016   Cyst of left ovary 10/14/2016   DDD (degenerative disc disease), cervical 10/09/2014   Fatigue 10/09/2014   Fibromyalgia    Hypertension    GERD (gastroesophageal reflux disease) 02/14/2013    Past  Surgical History:  Procedure Laterality Date   LAPAROSCOPIC GASTRIC SLEEVE RESECTION N/A 04/23/2021   Procedure: LAPAROSCOPIC GASTRIC SLEEVE RESECTION;  Surgeon: Stevie Herlene Righter, MD;  Location: [facility];  Service: General;  Laterality: N/A;   OTHER SURGICAL HISTORY  01/06/2024   [facility]   TUBAL LIGATION     UPPER GI ENDOSCOPY N/A 04/23/2021   Procedure: UPPER GI ENDOSCOPY;  Surgeon: Stevie, Herlene Righter, MD;  Location: [facility];  Service: General;  Laterality: N/A;   WISDOM TOOTH EXTRACTION      Family History  Problem Relation Age of Onset   COPD Mother    Depression Mother    Diabetes Father    Hypertension Father    Stroke Father    Bipolar disorder Sister    Post-traumatic stress disorder Sister    Alcohol abuse Sister    Drug abuse Sister    Pulmonary embolism Sister    Pancreatic cancer Sister        late 54s   Prostate cancer Paternal Grandfather    Bipolar disorder Cousin    Schizophrenia Cousin     Social History   Socioeconomic History   Marital status: Widowed    Spouse name: Toribio    Number of children: 3   Years of education: Assoc    Highest education level: Not on file  Occupational History    Employer: [employer]  Tobacco Use   Smoking status: Former    Current packs/day: 0.00    Types: Cigarettes  Quit date: 02/14/2009    Years since quitting: 15.1   Smokeless tobacco: Never  Vaping Use   Vaping status: Never Used  Substance and Sexual Activity   Alcohol use: Yes    Comment: Rarely    Drug use: No   Sexual activity: Yes    Birth control/protection: Surgical    Comment: Tubal Ligation  Other Topics Concern   Not on file  Social History Narrative   Patient lives at home with her husband Honey) and her children.    Patient works full time.   Caffeine- Tea 3-4 times daily.   Patient is right-handed.   Patient has a Scientist, research (physical sciences).         Social Drivers of Corporate investment banker Strain: Not on file  Food  Insecurity: Not on file  Transportation Needs: Not on file  Physical Activity: Not on file  Stress: Not on file  Social Connections: Not on file  Intimate Partner Violence: Not on file    Outpatient Medications Prior to Visit  Medication Sig Dispense Refill   albuterol  (PROVENTIL ) (2.5 MG/3ML) 0.083% nebulizer solution as needed.     albuterol  (VENTOLIN  HFA) 108 (90 Base) MCG/ACT inhaler Inhale 2 puffs into the lungs daily as needed (Asthma).     baclofen  (LIORESAL ) 10 MG tablet Take 1 tablet every night at bedtime. May also take during the day every 8 hours as needed (max daily dose is 3 tablets total in a 24 hour period). 90 tablet 2   buPROPion  (WELLBUTRIN  SR) 150 MG 12 hr tablet Take 1 tablet (150 mg total) by mouth 2 (two) times daily. 60 tablet 0   cetirizine  (ZYRTEC ) 10 MG tablet once.     fluticasone -salmeterol (ADVAIR  HFA) 115-21 MCG/ACT inhaler Inhale 2 puffs into the lungs 2 (two) times daily.     HYDROcodone -acetaminophen  (NORCO/VICODIN) 5-325 MG tablet Take 1 tablet by mouth every 6 (six) hours as needed. 10 tablet 0   hydrOXYzine  (ATARAX ) 25 MG tablet Take 1 tablet (25 mg total) by mouth at bedtime. 30 tablet 11   ibuprofen (ADVIL) 800 MG tablet Take by mouth as needed.     LORazepam  (ATIVAN ) 1 MG tablet Take 1 tablet (1 mg total) by mouth daily as needed for up to 60 doses for anxiety. 30 tablet 1   pantoprazole  (PROTONIX ) 40 MG tablet Take 1 tablet (40 mg total) by mouth daily. 90 tablet 0   phenazopyridine (PYRIDIUM) 100 MG tablet once.     QUEtiapine  (SEROQUEL ) 200 MG tablet Take 1 tablet (200 mg total) by mouth at bedtime. 30 tablet 1   QUEtiapine  (SEROQUEL ) 50 MG tablet Take 1 tablet (50 mg total) by mouth at bedtime. Total of 250 mg at night. Take along with 200 mg tab 30 tablet 1   rizatriptan  (MAXALT -MLT) 10 MG disintegrating tablet Take 1 tablet (10 mg total) by mouth as needed. May repeat in 2 hours if needed 12 tablet 6   Semaglutide-Weight Management (WEGOVY [place])  once a week.     valACYclovir (VALTREX) 1000 MG tablet Take 1,000 mg by mouth daily as needed (Epstein-Barr).     venlafaxine  (EFFEXOR ) 100 MG tablet Take 1 tablet (100 mg total) by mouth 2 (two) times daily. 180 tablet 1   ondansetron  (ZOFRAN -ODT) 4 MG disintegrating tablet Dissolve 1 tablet (4 mg total) by mouth every 6 (six) hours as needed for nausea or vomiting. 20 tablet 0   ondansetron  (ZOFRAN -ODT) 4 MG disintegrating tablet Take 1 tablet (4 mg  total) by mouth every 8 (eight) hours as needed. 20 tablet 6   QUEtiapine  (SEROQUEL ) 100 MG tablet Take 1 tablet (100 mg total) by mouth at bedtime. (Patient taking differently: Take 50 mg by mouth at bedtime. Total of 250 mg at night) 30 tablet 0   No facility-administered medications prior to visit.      ROS:  Review of Systems  Constitutional:  Positive for fatigue. Negative for fever.  Gastrointestinal:  Negative for blood in stool, constipation, diarrhea, nausea and vomiting.  Genitourinary:  Negative for dyspareunia, dysuria, flank pain, frequency, hematuria, urgency, vaginal bleeding, vaginal discharge and vaginal pain.  Musculoskeletal:  Negative for back pain.  Skin:  Negative for rash.  Psychiatric/Behavioral:  Positive for agitation and dysphoric mood.    BREAST: No symptoms   OBJECTIVE:   Vitals:  BP 113/81   Pulse 81   Ht 5' 6 (1.676 m)   Wt 147 lb (66.7 kg)   LMP 03/29/2021 Comment: urine preg.pending,04/23/21  BMI 23.73 kg/m   Physical Exam Vitals reviewed.  Constitutional:      Appearance: She is well-developed.  Pulmonary:     Effort: Pulmonary effort is normal.  Musculoskeletal:        General: Normal range of motion.     Cervical back: Normal range of motion.  Skin:    General: Skin is warm and dry.  Neurological:     General: No focal deficit present.     Mental Status: She is alert and oriented to person, place, and time.     Cranial Nerves: No cranial nerve deficit.  Psychiatric:        Mood and  Affect: Mood normal.        Behavior: Behavior normal.        Thought Content: Thought content normal.        Judgment: Judgment normal.     Assessment/Plan: Decreased libido - Plan: AMBULATORY NON FORMULARY MEDICATION; discussed trying testosterone crm to help with sx. Also on mult meds that can cause decreased libido. Rx faxed to [facility]. Will f/u in 2 months with pap re: sx.   Chronic fatigue - Plan: AMBULATORY NON FORMULARY MEDICATION; labs with PCP, on mult meds that could be affecting sx. Try testosterone to see if helps.  Discussed HRT and use for VS sx in particular, but pt without those sx. HRT not best option to treat current sx; crying better managed with psych meds.   Meds ordered this encounter  Medications   AMBULATORY NON FORMULARY MEDICATION    Sig: Medication Name: Testosterone 1 mg/ml topical cream Apply 1 ml pump to labia daily, avoid transference    Dispense:  30 mL    Refill:  2    Supervising Provider:   ROBY, MICIA [[id number]]      Return in about 2 months (around 05/22/2024) for pap/HRT f/u.  Meagon Duskin B. Veronique Warga, PA-C 03/22/2024 4:27 PM

## 2024-03-22 NOTE — Patient Instructions (Signed)
 I value your feedback and you entrusting [place] with your care. If you get a [place] patient survey, I would appreciate you taking the time to let us know about your experience today. Thank you! ? ? ?

## 2024-03-28 ENCOUNTER — Telehealth: Admitting: Psychiatry

## 2024-03-28 ENCOUNTER — Encounter: Payer: Self-pay | Admitting: Psychiatry

## 2024-03-28 DIAGNOSIS — G2401 Drug induced subacute dyskinesia: Secondary | ICD-10-CM | POA: Diagnosis not present

## 2024-03-28 DIAGNOSIS — F3181 Bipolar II disorder: Secondary | ICD-10-CM

## 2024-03-28 DIAGNOSIS — F419 Anxiety disorder, unspecified: Secondary | ICD-10-CM

## 2024-03-28 MED ORDER — BUPROPION HCL ER (SR) 150 MG PO TB12: 150.0000 mg | ORAL_TABLET | Freq: Two times a day (BID) | ORAL | 0 refills | Status: DC

## 2024-03-28 MED ORDER — ILOPERIDONE 1 MG PO TABS: 1.0000 mg | ORAL_TABLET | Freq: Every day | ORAL | 0 refills | Status: AC
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
## 2024-03-28 NOTE — Patient Instructions (Signed)
 Hold quetiapine   Start iloperidone 1 mg at night Continue venlafaxine  100 mg twice a day Continue bupropion  150 mg twice a day Continue lorazepam  1 mg daily as needed for anxiety  Next appointment: 11/24 at 11 am
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
## 2024-04-12 ENCOUNTER — Telehealth: Payer: Self-pay

## 2024-04-12 NOTE — Telephone Encounter (Signed)
 Received referral from [name] Macadam FNP-C [phone]) for daytime somnolence. Placed in sleep mailbox

## 2024-04-14 ENCOUNTER — Ambulatory Visit: Admitting: Psychiatry

## 2024-04-18 ENCOUNTER — Other Ambulatory Visit (HOSPITAL_COMMUNITY): Payer: Self-pay | Admitting: Nurse Practitioner

## 2024-04-18 DIAGNOSIS — R400 Somnolence: Secondary | ICD-10-CM

## 2024-04-20 ENCOUNTER — Ambulatory Visit (HOSPITAL_COMMUNITY)
Admission: RE | Admit: 2024-04-20 | Discharge: 2024-04-20 | Disposition: A | Discharge status: Home / Self Care | Source: Ambulatory Visit | Attending: Nurse Practitioner | Admitting: Nurse Practitioner

## 2024-04-20 DIAGNOSIS — R400 Somnolence: Secondary | ICD-10-CM | POA: Insufficient documentation

## 2024-04-20 MED ORDER — GADOBUTROL 1 MMOL/ML IV SOLN
6.5000 mL | Freq: Once | INTRAVENOUS | Status: AC | PRN
Start: 2024-04-20 — End: 2024-04-20
  Administered 2024-04-20: 6.5 mL via INTRAVENOUS

## 2024-04-27 ENCOUNTER — Ambulatory Visit (INDEPENDENT_AMBULATORY_CARE_PROVIDER_SITE_OTHER): Admitting: Urology

## 2024-04-27 ENCOUNTER — Encounter: Payer: Self-pay | Admitting: Urology

## 2024-04-27 VITALS — BP 130/83 | HR 71

## 2024-04-27 DIAGNOSIS — R102 Pelvic and perineal pain unspecified side: Secondary | ICD-10-CM

## 2024-04-27 DIAGNOSIS — M6289 Other specified disorders of muscle: Secondary | ICD-10-CM

## 2024-04-27 DIAGNOSIS — N3010 Interstitial cystitis (chronic) without hematuria: Secondary | ICD-10-CM | POA: Diagnosis not present

## 2024-04-27 MED ORDER — BACLOFEN 10 MG PO TABS: ORAL_TABLET | ORAL | 2 refills | Status: AC

## 2024-04-27 MED ORDER — HYDROXYZINE HCL 25 MG PO TABS: 25.0000 mg | ORAL_TABLET | Freq: Every evening | ORAL | 11 refills | Status: AC

## 2024-04-27 NOTE — Patient Instructions (Signed)
 Interstitial Cystitis  Interstitial cystitis (IC) is inflammation of the bladder. It's also called painful bladder syndrome. It can cause pain near your bladder. It can also make you have to pee urgently and often. IC may flare up and then go away for a while. In some cases, it may become a long-term (chronic) problem. What are the causes? The cause of IC isn't known. What increases the risk? You may be more likely to get IC if: You're female. You have certain other conditions. These include: Fibromyalgia. Irritable bowel syndrome (IBS). Endometriosis. Chronic fatigue syndrome. You may have worse symptoms if: You're under a lot of stress. You smoke. You have certain foods or drinks. What are the signs or symptoms? Your symptoms may change over time. They may include: Discomfort or pain near your bladder. The pain may range from mild to very bad. You may have more or less pain as your bladder fills with pee and empties. Pain in your pelvic area. This is the area between your hip bones. Needing to pee often or all the time. Pain when you pee. Pain during sex. Blood in your pee. Tiredness. If you're female, your symptoms may get worse when you have your menstrual period. How is this diagnosed? IC is diagnosed based on your symptoms, your medical history, and an exam. Your health care provider may need to rule out other conditions. They may do tests, such as: Pee tests. A cystoscopy. This test looks at the inside of your bladder. Biopsy. This is when a small piece of tissue is removed from your bladder for testing. How is this treated? There's no cure for IC. But treatment can help you manage your symptoms. Work with your provider to find the best treatments for you. These may include: Medicines to help with pain or to reduce how often you feel the need to pee. These may be given by mouth or put in your bladder using a soft tube called a catheter. Diet changes. Taking steps to  manage stress. Physical therapy. This may include: Exercises. These can help you relax your pelvic floor muscles. Massage. This may be done to relax tight muscles. Bladder training. This is when you learn ways to control when you pee. Neuromodulation therapy. This uses a device that's put on your back. It blocks the nerves that cause you to feel pain near your bladder. A procedure to stretch your bladder. This may be done by filling your bladder with air or fluid. Surgery. This is rare. It's only done if other treatments don't help. Follow these instructions at home: Eating and drinking Make changes to your diet as told by your provider. You may need to avoid: Spicy foods. Foods with acid in them, like tomatoes or citrus. Foods with a lot of potassium in them. Avoid drinking alcohol and caffeine. These drinks can make you have to pee more. Lifestyle Learn and practice ways to relax. These may include deep breathing and muscle relaxation. Get care for your mental well-being. You may need to: Do cognitive behavioral therapy (CBT). This therapy can change the way you think or act in response to things. It may help you feel better. See a therapist if you're depressed. Work with your provider on other ways to manage pain. Acupuncture may help. Do not use any products that contain nicotine or tobacco. These products include cigarettes, chewing tobacco, and vaping devices, such as e-cigarettes. If you need help quitting, ask your provider. Bladder training  Do bladder training as told. You  may need to: Pee at set, regular times. Train yourself to delay peeing. Keep a bladder diary. Write down: The times you pee. Any symptoms you have. The diary can help you find out what makes your symptoms worse. Use the diary to schedule times to pee. If you're away from home, plan to be near a bathroom at those times. Make sure you pee: Just before you leave the house. Just before you go to  bed. General instructions Take over-the-counter and prescription medicines only as told by your provider. Try putting a warm or cool cloth called a compress over your bladder. This can help with pain. Avoid wearing tight clothes. Do exercises as told by your provider. Where to find more information [employer]: urologyhealth.org Interstitial Cystitis Association (ICA): [employer] Contact a health care provider if: Your symptoms don't get better with treatment. Your pain or discomfort gets worse. You have to pee more often. You have little to no control over when you pee. You have a fever or chills. This information is not intended to replace advice given to you by your health care provider. Make sure you discuss any questions you have with your health care provider. Document Revised: 09/20/2022 Document Reviewed: 09/20/2022 Elsevier Patient Education  2024 [employer].

## 2024-04-27 NOTE — Progress Notes (Signed)
 04/27/2024 2:43 PM   [facility] 22-Mar-1976 [id number]  Referring provider: [name], MD [address] [facility],  [place] [address]  Followup pelvic pain   HPI: Ms [facility] is a 48yo here for followup for IC and pelvic pain. Her pelvic pain has significantly improved with baclofen  at bedtime. She denies any significant  urinary frequency, urgency, dysuria. No straining to urinate. No other complaints    PMH: Past Medical History:  Diagnosis Date   Anxiety    Asthma    Bipolar 1 disorder (HCC)    Breast discharge    Depression    Epstein Barr infection    Essential hypertension    Fibromyalgia    Lyme disease    Pneumonia    [place] spotted fever     Surgical History: Past Surgical History:  Procedure Laterality Date   LAPAROSCOPIC GASTRIC SLEEVE RESECTION N/A 04/23/2021   Procedure: LAPAROSCOPIC GASTRIC SLEEVE RESECTION;  Surgeon: Sharon Herlene Righter, MD;  Location: [facility];  Service: General;  Laterality: N/A;   OTHER SURGICAL HISTORY  01/06/2024   [facility] Surgery   TUBAL LIGATION     UPPER GI ENDOSCOPY N/A 04/23/2021   Procedure: UPPER GI ENDOSCOPY;  Surgeon: Sharon Herlene Righter, MD;  Location: [facility];  Service: General;  Laterality: N/A;   WISDOM TOOTH EXTRACTION      Home Medications:  Allergies as of 04/27/2024   No Known Allergies      Medication List        Accurate as of April 27, 2024  2:43 PM. If you have any questions, ask your nurse or doctor.          albuterol  108 (90 Base) MCG/ACT inhaler Commonly known as: VENTOLIN  HFA Inhale 2 puffs into the lungs daily as needed (Asthma).   albuterol  (2.5 MG/3ML) 0.083% nebulizer solution Commonly known as: PROVENTIL  as needed.   AMBULATORY NON FORMULARY MEDICATION Medication Name: Testosterone 1 mg/ml topical cream Apply 1 ml pump to labia daily, avoid transference   baclofen  10 MG tablet Commonly known as: LIORESAL  Take 1 tablet every night at bedtime. May also take during  the day every 8 hours as needed (max daily dose is 3 tablets total in a 24 hour period).   buPROPion  150 MG 12 hr tablet Commonly known as: WELLBUTRIN  SR Take 1 tablet (150 mg total) by mouth 2 (two) times daily.   cetirizine  10 MG tablet Commonly known as: ZYRTEC  once.   fluticasone -salmeterol 115-21 MCG/ACT inhaler Commonly known as: ADVAIR  HFA Inhale 2 puffs into the lungs 2 (two) times daily.   HYDROcodone -acetaminophen  5-325 MG tablet Commonly known as: NORCO/VICODIN Take 1 tablet by mouth every 6 (six) hours as needed.   hydrOXYzine  25 MG tablet Commonly known as: ATARAX  Take 1 tablet (25 mg total) by mouth at bedtime.   ibuprofen 800 MG tablet Commonly known as: ADVIL Take by mouth as needed.   Iloperidone 1 MG Tabs Take 1 tablet (1 mg total) by mouth at bedtime.   LORazepam  1 MG tablet Commonly known as: ATIVAN  Take 1 tablet (1 mg total) by mouth daily as needed for up to 60 doses for anxiety.   pantoprazole  40 MG tablet Commonly known as: PROTONIX  Take 1 tablet (40 mg total) by mouth daily.   phenazopyridine 100 MG tablet Commonly known as: PYRIDIUM once.   QUEtiapine  200 MG tablet Commonly known as: SEROQUEL  Take 1 tablet (200 mg total) by mouth at bedtime.   QUEtiapine  50 MG  tablet Commonly known as: SEROQUEL  Take 1 tablet (50 mg total) by mouth at bedtime. Total of 250 mg at night. Take along with 200 mg tab   rizatriptan  10 MG disintegrating tablet Commonly known as: Maxalt -MLT Take 1 tablet (10 mg total) by mouth as needed. May repeat in 2 hours if needed   valACYclovir 1000 MG tablet Commonly known as: VALTREX Take 1,000 mg by mouth daily as needed (Epstein-Barr).   venlafaxine  100 MG tablet Commonly known as: EFFEXOR  Take 1 tablet (100 mg total) by mouth 2 (two) times daily.   WEGOVY [facility] once a week.        Allergies: No Known Allergies  Family History: Family History  Problem Relation Age of Onset   COPD Mother    Depression  Mother    Diabetes Father    Hypertension Father    Stroke Father    Bipolar disorder Sister    Post-traumatic stress disorder Sister    Alcohol abuse Sister    Drug abuse Sister    Pulmonary embolism Sister    Pancreatic cancer Sister        late 67s   Prostate cancer Paternal Grandfather    Bipolar disorder Cousin    Schizophrenia Cousin     Social History:  reports that she quit smoking about 15 years ago. Her smoking use included cigarettes. She has never used smokeless tobacco. She reports current alcohol use. She reports that she does not use drugs.  ROS: All other review of systems were reviewed and are negative except what is noted above in HPI  Physical Exam: BP 130/83   Pulse 71   LMP 03/29/2021 Comment: urine preg.pending,04/23/21  Constitutional:  Alert and oriented, No acute distress. HEENT: [facility] AT, moist mucus membranes.  Trachea midline, no masses. Cardiovascular: No clubbing, cyanosis, or edema. Respiratory: Normal respiratory effort, no increased work of breathing. GI: Abdomen is soft, nontender, nondistended, no abdominal masses GU: No CVA tenderness.  Lymph: No cervical or inguinal lymphadenopathy. Skin: No rashes, bruises or suspicious lesions. Neurologic: Grossly intact, no focal deficits, moving all 4 extremities. Psychiatric: Normal mood and affect.  Laboratory Data: Lab Results  Component Value Date   WBC 4.7 11/12/2023   HGB 12.9 11/12/2023   HCT 39.0 11/12/2023   MCV 100 (H) 11/12/2023   PLT 235 11/12/2023    Lab Results  Component Value Date   CREATININE 0.79 10/21/2023    No results found for: PSA  No results found for: TESTOSTERONE  No results found for: HGBA1C  Urinalysis    Component Value Date/Time   COLORURINE AMBER (A) 07/17/2021 1938   APPEARANCEUR Clear 11/19/2023 1250   LABSPEC 1.030 07/17/2021 1938   PHURINE 5.0 07/17/2021 1938   GLUCOSEU Negative 11/19/2023 1250   HGBUR NEGATIVE 07/17/2021 1938   BILIRUBINUR  Negative 11/19/2023 1250   KETONESUR 5 (A) 07/17/2021 1938   PROTEINUR Negative 11/19/2023 1250   PROTEINUR 30 (A) 07/17/2021 1938   UROBILINOGEN 0.2 10/23/2014 1629   NITRITE Negative 11/19/2023 1250   NITRITE NEGATIVE 07/17/2021 1938   LEUKOCYTESUR Negative 11/19/2023 1250   LEUKOCYTESUR NEGATIVE 07/17/2021 1938    Lab Results  Component Value Date   LABMICR Comment 11/19/2023   BACTERIA NONE SEEN 07/17/2021    Pertinent Imaging:  No results found for this or any previous visit.  No results found for this or any previous visit.  No results found for this or any previous visit.  No results found for this or any previous  visit.  Results for orders placed during the hospital encounter of 10/29/23  US  RENAL  Narrative CLINICAL DATA:  History of kidney stones.  EXAM: RENAL / URINARY TRACT ULTRASOUND COMPLETE  COMPARISON:  CT abdomen and pelvis July 17, 2021  FINDINGS: Right Kidney:  Renal measurements: 10.3 x 4.8 x 5.2 cm = volume: 136 mL. Echogenicity within normal limits. No mass or hydronephrosis visualized.  Left Kidney:  Renal measurements: 11.2 x 5.4 x 5 cm = volume: 157 mL. Echogenicity within normal limits. No mass or hydronephrosis visualized.  Bladder:  Appears normal for degree of bladder distention.  Other:  None.  IMPRESSION: Normal renal ultrasound.   Electronically Signed By: Craig Farr M.D. On: 10/29/2023 16:54  No results found for this or any previous visit.  No results found for this or any previous visit.  No results found for this or any previous visit.   Assessment & Plan:    1. Interstitial cystitis (Primary) Baclofen  10mg  qhs - Urinalysis, Routine w reflex microscopic  2. Pelvic pain in female Hydroxyzine  10mg  qhs   No follow-ups on file.  Belvie Clara, MD  [facility] [place]

## 2024-04-28 LAB — URINALYSIS, ROUTINE W REFLEX MICROSCOPIC
Bilirubin, UA: NEGATIVE
Glucose, UA: NEGATIVE
Leukocytes,UA: NEGATIVE
Nitrite, UA: NEGATIVE
Protein,UA: NEGATIVE
RBC, UA: NEGATIVE
Specific Gravity, UA: 1.03 (ref 1.005–1.030)
Urobilinogen, Ur: 2 mg/dL — ABNORMAL HIGH (ref 0.2–1.0)
pH, UA: 6 (ref 5.0–7.5)

## 2024-04-30 DIAGNOSIS — Z1371 Encounter for nonprocreative screening for genetic disease carrier status: Secondary | ICD-10-CM

## 2024-04-30 HISTORY — DX: Encounter for nonprocreative screening for genetic disease carrier status: Z13.71

## 2024-05-18 NOTE — Progress Notes (Unsigned)
 Sharon Norleen PEDLAR, MD   No chief complaint on file.   HPI:      [name] is a 48 y.o. 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 11/19/2023   Atrophic vaginitis 11/19/2023   Pelvic pain in female 11/19/2023   Interstitial cystitis 11/19/2023   Migraine 08/18/2023   Genital herpes simplex 02/04/2023   Mild intermittent asthma 10/14/2021   Mixed hyperlipidemia 10/14/2021   Prediabetes 10/10/2021   Bipolar 2 disorder (HCC) 07/13/2019   Myalgia 05/19/2019   Asthma 05/18/2019   MDD (major depressive disorder) 10/25/2018   GAD (generalized anxiety disorder) 10/25/2018   Chronic insomnia 10/25/2018   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) 11/04/2016   Class 2 obesity 11/04/2016   Abnormal uterine bleeding 10/14/2016   Cyst of left ovary 10/14/2016   DDD (degenerative disc disease), cervical 10/09/2014   Fatigue 10/09/2014   Fibromyalgia    Hypertension    GERD (gastroesophageal reflux disease) 02/14/2013    Past Surgical History:  Procedure Laterality Date   LAPAROSCOPIC GASTRIC SLEEVE RESECTION N/A 04/23/2021   Procedure: LAPAROSCOPIC GASTRIC SLEEVE RESECTION;  Surgeon: Stevie Herlene Righter, MD;  Location: [facility];  Service: General;  Laterality: N/A;   OTHER SURGICAL HISTORY  01/06/2024   [facility] Surgery   TUBAL LIGATION     UPPER GI ENDOSCOPY N/A 04/23/2021   Procedure: UPPER GI ENDOSCOPY;  Surgeon: Stevie, Herlene Righter, MD;  Location: [facility];  Service: General;  Laterality: N/A;   WISDOM TOOTH EXTRACTION      Family History  Problem Relation Age of Onset   COPD Mother    Depression Mother    Diabetes Father     Hypertension Father    Stroke Father    Bipolar disorder Sister    Post-traumatic stress disorder Sister    Alcohol abuse Sister    Drug abuse Sister    Pulmonary embolism Sister    Pancreatic cancer Sister        late 39s   Prostate cancer Paternal Grandfather    Bipolar disorder Cousin    Schizophrenia Cousin     Social History   Socioeconomic History   Marital status: Widowed    Spouse name: Toribio    Number of children: 3   Years of education: Assoc    Highest education level: Not on file  Occupational History    Employer: [employer]  Tobacco Use   Smoking status: Former    Current packs/day: 0.00    Types: Cigarettes    Quit date: 02/14/2009    Years since quitting: 15.2   Smokeless tobacco: Never  Vaping Use   Vaping status: Never Used  Substance and Sexual Activity   Alcohol use: Yes    Comment: Rarely    Drug use: No   Sexual activity: Yes    Birth control/protection: Surgical    Comment: Tubal Ligation  Other Topics Concern   Not on file  Social History Narrative   Patient lives at home with her husband Honey) and her children.    Patient works full time.   Caffeine- Tea 3-4 times daily.   Patient is right-handed.   Patient  has a Scientist, Research (physical Sciences).         Social Drivers of Corporate Investment Banker Strain: Not on file  Food Insecurity: Not on file  Transportation Needs: Not on file  Physical Activity: Not on file  Stress: Not on file  Social Connections: Not on file  Intimate Partner Violence: Not on file    Outpatient Medications Prior to Visit  Medication Sig Dispense Refill   albuterol  (PROVENTIL ) (2.5 MG/3ML) 0.083% nebulizer solution as needed.     albuterol  (VENTOLIN  HFA) 108 (90 Base) MCG/ACT inhaler Inhale 2 puffs into the lungs daily as needed (Asthma).     AMBULATORY NON FORMULARY MEDICATION Medication Name: Testosterone 1 mg/ml topical cream Apply 1 ml pump to labia daily, avoid transference 30 mL 2   baclofen  (LIORESAL )  10 MG tablet Take 1 tablet every night at bedtime. May also take during the day every 8 hours as needed (max daily dose is 3 tablets total in a 24 hour period). 90 tablet 2   buPROPion  (WELLBUTRIN  SR) 150 MG 12 hr tablet Take 1 tablet (150 mg total) by mouth 2 (two) times daily. 180 tablet 0   cetirizine  (ZYRTEC ) 10 MG tablet once.     fluticasone -salmeterol (ADVAIR  HFA) 115-21 MCG/ACT inhaler Inhale 2 puffs into the lungs 2 (two) times daily.     HYDROcodone -acetaminophen  (NORCO/VICODIN) 5-325 MG tablet Take 1 tablet by mouth every 6 (six) hours as needed. 10 tablet 0   hydrOXYzine  (ATARAX ) 25 MG tablet Take 1 tablet (25 mg total) by mouth at bedtime. 30 tablet 11   ibuprofen (ADVIL) 800 MG tablet Take by mouth as needed.     Iloperidone 1 MG TABS Take 1 tablet (1 mg total) by mouth at bedtime. 30 tablet 0   LORazepam  (ATIVAN ) 1 MG tablet Take 1 tablet (1 mg total) by mouth daily as needed for up to 60 doses for anxiety. 30 tablet 1   pantoprazole  (PROTONIX ) 40 MG tablet Take 1 tablet (40 mg total) by mouth daily. 90 tablet 0   phenazopyridine (PYRIDIUM) 100 MG tablet once.     QUEtiapine  (SEROQUEL ) 200 MG tablet Take 1 tablet (200 mg total) by mouth at bedtime. 30 tablet 1   QUEtiapine  (SEROQUEL ) 50 MG tablet Take 1 tablet (50 mg total) by mouth at bedtime. Total of 250 mg at night. Take along with 200 mg tab 30 tablet 1   rizatriptan  (MAXALT -MLT) 10 MG disintegrating tablet Take 1 tablet (10 mg total) by mouth as needed. May repeat in 2 hours if needed 12 tablet 6   Semaglutide-Weight Management (WEGOVY [place]) once a week.     valACYclovir (VALTREX) 1000 MG tablet Take 1,000 mg by mouth daily as needed (Epstein-Barr).     venlafaxine  (EFFEXOR ) 100 MG tablet Take 1 tablet (100 mg total) by mouth 2 (two) times daily. 180 tablet 1   No facility-administered medications prior to visit.      ROS:  Review of Systems BREAST: No symptoms   OBJECTIVE:   Vitals:  LMP 03/29/2021 Comment: urine  preg.pending,04/23/21  Physical Exam  Results: No results found for this or any previous visit (from the past 24 hours).   Assessment/Plan: No diagnosis found.    No orders of the defined types were placed in this encounter.     No follow-ups on file.  Neala Miggins B. Geoff Dacanay, PA-C 05/18/2024 7:36 PM

## 2024-05-19 ENCOUNTER — Other Ambulatory Visit (HOSPITAL_COMMUNITY)
Admission: RE | Admit: 2024-05-19 | Discharge: 2024-05-19 | Disposition: A | Source: Ambulatory Visit | Attending: Obstetrics and Gynecology | Admitting: Obstetrics and Gynecology

## 2024-05-19 ENCOUNTER — Encounter: Payer: Self-pay | Admitting: Obstetrics and Gynecology

## 2024-05-19 ENCOUNTER — Ambulatory Visit: Admitting: Obstetrics and Gynecology

## 2024-05-19 VITALS — BP 123/86 | HR 84 | Ht 66.0 in | Wt 144.0 lb

## 2024-05-19 DIAGNOSIS — Z124 Encounter for screening for malignant neoplasm of cervix: Secondary | ICD-10-CM | POA: Insufficient documentation

## 2024-05-19 DIAGNOSIS — Z800 Family history of malignant neoplasm of digestive organs: Secondary | ICD-10-CM | POA: Diagnosis not present

## 2024-05-19 DIAGNOSIS — Z1151 Encounter for screening for human papillomavirus (HPV): Secondary | ICD-10-CM | POA: Diagnosis present

## 2024-05-19 DIAGNOSIS — R6882 Decreased libido: Secondary | ICD-10-CM

## 2024-05-19 DIAGNOSIS — R5382 Chronic fatigue, unspecified: Secondary | ICD-10-CM

## 2024-05-19 NOTE — Patient Instructions (Signed)
 I value your feedback and you entrusting [place] with your care. If you get a [place] patient survey, I would appreciate you taking the time to let us know about your experience today. Thank you! ? ? ?

## 2024-05-23 ENCOUNTER — Ambulatory Visit: Admitting: Obstetrics and Gynecology

## 2024-05-23 ENCOUNTER — Institutional Professional Consult (permissible substitution): Admitting: Neurology

## 2024-05-23 LAB — CYTOLOGY - PAP
Comment: NEGATIVE
Diagnosis: NEGATIVE
High risk HPV: NEGATIVE
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
## 2024-05-31 ENCOUNTER — Ambulatory Visit: Admitting: Neurology

## 2024-05-31 ENCOUNTER — Encounter: Payer: Self-pay | Admitting: Neurology

## 2024-05-31 VITALS — BP 135/88 | HR 74 | Ht 66.0 in | Wt 144.0 lb

## 2024-05-31 DIAGNOSIS — Z820 Family history of epilepsy and other diseases of the nervous system: Secondary | ICD-10-CM

## 2024-05-31 DIAGNOSIS — Z79899 Other long term (current) drug therapy: Secondary | ICD-10-CM | POA: Diagnosis not present

## 2024-05-31 DIAGNOSIS — R351 Nocturia: Secondary | ICD-10-CM

## 2024-05-31 DIAGNOSIS — R519 Headache, unspecified: Secondary | ICD-10-CM

## 2024-05-31 DIAGNOSIS — R0683 Snoring: Secondary | ICD-10-CM

## 2024-05-31 DIAGNOSIS — Z9189 Other specified personal risk factors, not elsewhere classified: Secondary | ICD-10-CM

## 2024-05-31 DIAGNOSIS — R400 Somnolence: Secondary | ICD-10-CM | POA: Diagnosis not present

## 2024-05-31 NOTE — Progress Notes (Signed)
 Subjective:    Patient ID: Sharon Merritt is a 48 y.o. female.  HPI    True Mar, MD, PhD [facility] [address], [address] [address] [place], [place] [address]  Dear [name],  I saw your patient, Sharon Merritt, upon your kind request in my sleep clinic today for evaluation of her sleep disorder, in particular, concern for underlying obstructive sleep apnea.  The patient is unaccompanied today.  As you know, 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  Her Epworth sleepiness score is 19 out of 24, fatigue severity score is 63 out of 63.  She has fallen asleep at work and she feels sleepy while driving but her sleepiness does not come on as an attack or suddenly, she does have some warning and has been able to pull over.  She works as a dietitian for [employer].  She goes to bed between 9 and 9:30 PM and rise time is between 4:30 AM and 6 AM.  She currently lives alone.  Her significant other, [name], does not complain about any apneas and has noticed some snoring but feels it is not loud.  She has lost quite a bit of weight since her bariatric surgery.  She has 3 grown children.  She has 6 dogs at the house and if she comes in for sleep study her daughter will be able to take care of the dogs at home.  Patient drinks soda in the form of coffee in the morning and about 2 cans or 2 bottles of soda per day.  She drinks alcohol rarely.  She quit smoking in 2010.  Her sister had sleep apnea, sister passed away at age 49 from heart disease.   Of note, the patient is on several psychotropic and potentially sedating medications including baclofen , carbamazepine , lorazepam  as  needed, methocarbamol as needed which she typically no longer takes, she currently no longer takes any narcotic pain medication, psychiatry has prescribed a new antipsychotic medication but she has not filled it yet.  She is on venlafaxine  100 mg twice daily.   I reviewed your office note from 12/08/2023.  She also takes hydroxyzine  at bedtime for her interstitial cystitis.  She has recently seen Dr. Onita in our clinic for migraine management.  She was started on rizatriptan  and no longer takes Ubrelvy.  She has occasional morning headaches and has had nocturia about once or twice per average night.  She had seen Dr. Milton in the past.  She had a sleep study on 03/04/2017 and I was able to review the report.  Her AHI was 0/h at the time.  Sleep efficiency was 82.6%, REM latency delayed at 216.5 minutes, sleep latency 23.9 minutes.  O2 nadir was 90%.  She has not been on PAP therapy.  She has had interim weight loss after her bariatric surgery.  She had also seen Dr. Maude Abbot in our clinic in the remote past, over 10 years ago.  Her Past Medical History Is Significant For: Past Medical History:  Diagnosis Date   Anxiety    Asthma    Bipolar 1 disorder (HCC)    Breast discharge    Depression    Epstein Barr infection    Essential hypertension  Fibromyalgia    Lyme disease    Pneumonia    [facility] spotted fever     Her Past Surgical History Is Significant For: Past Surgical History:  Procedure Laterality Date   LAPAROSCOPIC GASTRIC SLEEVE RESECTION N/A 04/23/2021   Procedure: LAPAROSCOPIC GASTRIC SLEEVE RESECTION;  Surgeon: Stevie, Herlene Righter, MD;  Location: [facility];  Service: General;  Laterality: N/A;   OTHER SURGICAL HISTORY  01/06/2024   Hig Surgery   OTHER SURGICAL HISTORY Bilateral 05/17/2024   Radiofrequency Ablation   TUBAL LIGATION     UPPER GI ENDOSCOPY N/A 04/23/2021   Procedure: UPPER GI ENDOSCOPY;  Surgeon: Stevie Herlene Righter, MD;  Location: [facility];  Service:  General;  Laterality: N/A;   WISDOM TOOTH EXTRACTION      Her Family History Is Significant For: Family History  Problem Relation Age of Onset   COPD Mother    Depression Mother    Diabetes Father    Hypertension Father    Stroke Father    Bipolar disorder Sister    Post-traumatic stress disorder Sister    Alcohol abuse Sister    Drug abuse Sister    Pulmonary embolism Sister    Pancreatic cancer Sister        late 11s   Sleep apnea Sister    Prostate cancer Paternal Grandfather    Bipolar disorder Cousin    Schizophrenia Cousin     Her Social History Is Significant For: Social History   Socioeconomic History   Marital status: Widowed    Spouse name: Toribio    Number of children: 3   Years of education: Assoc    Highest education level: Not on file  Occupational History    Employer: [employer]  Tobacco Use   Smoking status: Former    Current packs/day: 0.00    Types: Cigarettes    Quit date: 02/14/2009    Years since quitting: 15.3   Smokeless tobacco: Never  Vaping Use   Vaping status: Never Used  Substance and Sexual Activity   Alcohol use: Yes    Comment: Rarely    Drug use: No   Sexual activity: Yes    Birth control/protection: Surgical    Comment: Tubal Ligation  Other Topics Concern   Not on file  Social History Narrative   Patient lives at home with her husband Honey) and her children.    Patient works full time.   Caffeine- Tea 3-4 times daily.   Patient is right-handed.   Patient has a Scientist, Research (physical Sciences).         Social Drivers of Corporate Investment Banker Strain: Not on file  Food Insecurity: Not on file  Transportation Needs: Not on file  Physical Activity: Not on file  Stress: Not on file  Social Connections: Not on file    Her Allergies Are:  No Known Allergies:   Her Current Medications Are:  Outpatient Encounter Medications as of 05/31/2024  Medication Sig   albuterol  (PROVENTIL ) (2.5 MG/3ML) 0.083% nebulizer solution  as needed.   albuterol  (VENTOLIN  HFA) 108 (90 Base) MCG/ACT inhaler Inhale 2 puffs into the lungs daily as needed (Asthma).   AMBULATORY NON FORMULARY MEDICATION Medication Name: Testosterone 1 mg/ml topical cream Apply 1 ml pump to labia daily, avoid transference   baclofen  (LIORESAL ) 10 MG tablet Take 1 tablet every night at bedtime. May also take during the day every 8 hours as needed (max daily dose is 3 tablets total in a 24 hour  period).   buPROPion  (WELLBUTRIN  SR) 150 MG 12 hr tablet Take 1 tablet (150 mg total) by mouth 2 (two) times daily.   cetirizine  (ZYRTEC ) 10 MG tablet once.   famotidine  (PEPCID ) 40 MG tablet Take 40 mg by mouth daily.   fluticasone -salmeterol (ADVAIR  HFA) 115-21 MCG/ACT inhaler Inhale 2 puffs into the lungs 2 (two) times daily.   hydrOXYzine  (ATARAX ) 25 MG tablet Take 1 tablet (25 mg total) by mouth at bedtime.   ibuprofen (ADVIL) 800 MG tablet Take by mouth as needed.   LORazepam  (ATIVAN ) 1 MG tablet Take 1 tablet (1 mg total) by mouth daily as needed for up to 60 doses for anxiety.   meloxicam (MOBIC) 15 MG tablet Take 15 mg by mouth daily.   methocarbamol (ROBAXIN) 500 MG tablet Take 1 tablet every 8 hours by oral route as needed.   ondansetron  (ZOFRAN ) 8 MG tablet 1 tablet as needed Orally Once a day for 30 day(s)   pantoprazole  (PROTONIX ) 40 MG tablet Take 1 tablet (40 mg total) by mouth daily.   rizatriptan  (MAXALT -MLT) 10 MG disintegrating tablet Take 1 tablet (10 mg total) by mouth as needed. May repeat in 2 hours if needed   Semaglutide-Weight Management (WEGOVY [place]) once a week.   valACYclovir (VALTREX) 1000 MG tablet Take 1,000 mg by mouth daily as needed (Epstein-Barr).   venlafaxine  (EFFEXOR ) 100 MG tablet Take 1 tablet (100 mg total) by mouth 2 (two) times daily.   Iloperidone  1 MG TABS Take 1 tablet (1 mg total) by mouth at bedtime. (Patient not taking: Reported on 05/31/2024)   No facility-administered encounter medications on file as of 05/31/2024.   :   Review of Systems:  Out of a complete 14 point review of systems, all are reviewed and negative with the exception of these symptoms as listed below:   Review of Systems  Objective:  Neurological Exam  Physical Exam Physical Examination:   Vitals:   05/31/24 1530  BP: 135/88  Pulse: 74    General Examination: The patient is a very pleasant 48 y.o. female in no acute distress. She appears well-developed and well-nourished and well groomed.   HEENT: Normocephalic, atraumatic, pupils are equal, round and reactive to light, extraocular tracking is good without limitation to gaze excursion or nystagmus noted. No photophobia.No Corrective eye glasses in place. Hearing is grossly intact.  Face is symmetric with normal facial animation. Speech is clear without dysarthria. There is no hypophonia. There is no lip, neck/head, jaw or voice tremor. Neck is supple with full range of passive and active motion. There are no carotid bruits on auscultation.  Airway/Oropharynx exam reveals: mild mouth dryness, adequate dental hygiene and mild airway crowding, due to small airway entry, slightly elongated uvula, tonsillar size about 1+, neck circumference 14 inches.  Moderate to significant overbite noted.  Tongue protrudes centrally and palate elevates symmetrically.  Chest: Clear to auscultation without wheezing, rhonchi or crackles noted.  Heart: S1+S2+0, regular and normal without murmurs, rubs or gallops noted.   Abdomen: Soft, non-tender and non-distended.  Extremities: There is no pitting edema in the distal lower extremities bilaterally.   Skin: Warm and dry without trophic changes noted.   Musculoskeletal: exam reveals no obvious joint deformities.  Some right hip discomfort, history of left total hip replacement.  Neurologically:  Mental status: The patient is awake, alert and oriented in all 4 spheres. Her immediate and remote memory, attention, language skills and fund of  knowledge are appropriate. There is no  evidence of aphasia, agnosia, apraxia or anomia. Speech is clear with normal prosody and enunciation. Thought process is linear. Mood is normal and affect is normal.  Cranial nerves II - XII are as described above under HEENT exam.  Motor exam: Normal bulk, moving all 4 extremities without restriction although she does have right hip discomfort.  There is no obvious action or resting tremor.  Fine motor skills and coordination: Intact grossly.  Cerebellar testing: No dysmetria or intention tremor. There is no truncal or gait ataxia.  Sensory exam: intact to light touch in the upper and lower extremities.  Gait, station and balance: She stands without major difficulty.  She walks without a walking aid.    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 A laboratory attended sleep study is typically considered gold standard for evaluation of sleep disordered breathing.   I had a long chat with the patient about my findings and the diagnosis of sleep apnea, particularly OSA, its prognosis and treatment options. We talked about medical/conservative treatments, surgical interventions and non-pharmacological approaches for symptom control. I explained, in particular, the risks and ramifications of untreated moderate to severe OSA, especially with respect to developing cardiovascular disease down the road, including congestive heart failure (CHF), difficult to treat hypertension, cardiac arrhythmias (particularly A-fib), neurovascular complications including TIA,  stroke and dementia. Even type 2 diabetes has, in part, been linked to untreated OSA. Symptoms of untreated OSA may include (but may not be limited to) daytime sleepiness, nocturia (i.e. frequent nighttime urination), memory problems, mood irritability and suboptimally controlled or worsening mood disorder such as depression and/or anxiety, lack of energy, lack of motivation, physical discomfort, as well as recurrent headaches, especially morning or nocturnal headaches. We talked about the importance of maintaining a healthy lifestyle and striving for healthy weight.  She is advised that her severe sleepiness puts her at risk of falling asleep and advertently especially at the wheel.  She is strongly advised not to drive if feeling sleepy and pull over to rest or nap.  She demonstrates understanding and agreement.  She is advised that her daytime sleepiness is very likely related to taking several potentially sedating medications and she is encouraged to talk with her prescribing providers about this issue of polypharmacy or medication related side effects.  She has not started the newest prescription medicine that was recommended by her psychiatrist.  She is worried about side effects but is encouraged to talk to her psychiatrist about her concerns.   I recommended a sleep study at this time. I outlined the differences between a laboratory attended sleep study which is considered more comprehensive and accurate over the option of a home sleep test (HST); the latter may lead to underestimation of sleep disordered breathing in some instances and does not help with diagnosing upper airway resistance syndrome and is not accurate enough to diagnose primary central sleep apnea typically. I outlined possible surgical and non-surgical treatment options of OSA, including the use of a positive airway pressure (PAP) device (i.e. CPAP, AutoPAP/APAP or BiPAP in certain circumstances), a custom-made dental device (aka oral  appliance, which would require a referral to a specialist  dentist or orthodontist typically, and is generally speaking not considered for patients with full dentures or edentulous state), upper airway surgical options, such as traditional UPPP (which is not considered a first-line treatment) or the Inspire device (hypoglossal nerve stimulator, which would involve a referral for consultation with an ENT surgeon, after careful selection, following inclusion criteria - also not first-line treatment). I explained the PAP treatment option to the patient in detail, as this is generally considered first-line treatment.  The patient indicated that she would be willing to try PAP therapy, if the need arises. I explained the importance of being compliant with PAP treatment, not only for insurance purposes but primarily to improve patient's symptoms symptoms, and for the patient's long term health benefit, including to reduce Her cardiovascular risks longer-term.    We will pick up our discussion about the next steps and treatment options after testing.  We will keep her posted as to the test results by phone call and/or MyChart messaging where possible.  We will plan to follow-up in sleep clinic accordingly as well.  I answered all her questions today and the patient was in agreement.   I encouraged her to call with any interim questions, concerns, problems or updates or email us  through MyChart.  Generally speaking, sleep test authorizations may take up to 2 weeks, sometimes less, sometimes longer, the patient is encouraged to get in touch with us  if they do not hear back from the sleep lab staff directly within the next 2 weeks.  Thank you very much for allowing me to participate in the care of this nice patient. If I can be of any further assistance to you please do not hesitate to call me at [phone].  Sincerely,   True Mar, MD, PhD

## 2024-05-31 NOTE — Patient Instructions (Signed)
 Thank you for choosing [facility] for your sleep related care! It was nice to meet you today!   Here is what we discussed today:    Based on your symptoms and your exam I believe you are at risk for obstructive sleep apnea (aka OSA). We should proceed with a sleep study to determine whether you do or do not have OSA and how severe it is. Even, if you have mild OSA, I may want you to consider treatment with CPAP, as treatment of even borderline or mild sleep apnea can result and improvement of symptoms such as sleep disruption, daytime sleepiness, nighttime bathroom breaks, restless leg symptoms, improvement of headache syndromes, even improved mood disorder.   As explained, an attended sleep study (meaning you get to stay overnight in the sleep lab), lets us  monitor sleep-related behaviors such as sleep talking and leg movements in sleep, in addition to monitoring for sleep apnea.  A home sleep test is a screening tool for sleep apnea diagnosis only, but unfortunately, does not help with any other sleep-related diagnoses.  Please remember, the long-term risks and ramifications of untreated moderate to severe obstructive sleep apnea may include (but are not limited to): increased risk for cardiovascular disease, including congestive heart failure, stroke, difficult to control hypertension, treatment resistant obesity, arrhythmias, especially irregular heartbeat commonly known as A. Fib. (atrial fibrillation); even type 2 diabetes has been linked to untreated OSA.   Other correlations that untreated obstructive sleep apnea include macular edema which is swelling of the retina in the eyes, droopy eyelid syndrome, and elevated hemoglobin and hematocrit levels (often referred to as polycythemia).  Sleep apnea can cause disruption of sleep and sleep deprivation in most cases, which, in turn, can cause recurrent headaches, problems with memory, mood, concentration, focus, and vigilance. Most  people with untreated sleep apnea report excessive daytime sleepiness, which can affect their ability to drive. Please do not drive or use heavy equipment or machinery, if you feel sleepy! Patients with sleep apnea can also develop difficulty initiating and maintaining sleep (aka insomnia).  As we discussed, your severe daytime sleepiness is quite likely from several potentially sedating medications that you are on currently.  Having sleep apnea may increase your risk for other sleep disorders, including involuntary behaviors sleep such as sleep terrors, sleep talking, sleepwalking.    Having sleep apnea can also increase your risk for restless leg syndrome and leg movements at night.   Please note that untreated obstructive sleep apnea may carry additional perioperative morbidity. Patients with significant obstructive sleep apnea (typically, in the moderate to severe degree) should receive, if possible, perioperative PAP (positive airway pressure) therapy and the surgeons and particularly the anesthesiologists should be informed of the diagnosis and the severity of the sleep disordered breathing.   We will call you or email you through MyChart with regards to your test results and plan a follow-up in sleep clinic accordingly. Most likely, you will hear from one of our nurses.   Our sleep lab administrative assistant will call you to schedule your sleep study and give you further instructions, regarding the check in process for the sleep study, arrival time, what to bring, when you can expect to leave after the study, etc., and to answer any other logistical questions you may have. If you don't hear back from her by about 2 weeks from now, please feel free to call her direct line at [phone] or you can call our general clinic number, or email us   through My Chart.

## 2024-06-01 ENCOUNTER — Encounter: Payer: Self-pay | Admitting: Obstetrics and Gynecology

## 2024-06-02 ENCOUNTER — Encounter: Payer: Self-pay | Admitting: Psychiatry

## 2024-06-02 ENCOUNTER — Other Ambulatory Visit: Payer: Self-pay

## 2024-06-02 ENCOUNTER — Ambulatory Visit: Admitting: Psychiatry

## 2024-06-02 VITALS — BP 127/92 | HR 76 | Temp 98.0°F | Ht 66.0 in | Wt 144.4 lb

## 2024-06-02 DIAGNOSIS — G2401 Drug induced subacute dyskinesia: Secondary | ICD-10-CM | POA: Diagnosis not present

## 2024-06-02 DIAGNOSIS — F3181 Bipolar II disorder: Secondary | ICD-10-CM | POA: Diagnosis not present

## 2024-06-02 DIAGNOSIS — F419 Anxiety disorder, unspecified: Secondary | ICD-10-CM | POA: Diagnosis not present

## 2024-06-02 MED ORDER — BUPROPION HCL ER (SR) 150 MG PO TB12: 150.0000 mg | ORAL_TABLET | Freq: Two times a day (BID) | ORAL | 0 refills | Status: AC

## 2024-06-02 NOTE — Patient Instructions (Signed)
 Start iloperidone  1 mg at night after completing sleep study Continue venlafaxine  100 mg twice a day Continue bupropion  150 mg twice a day  Continue lorazepam  1 mg daily as needed for anxiety  Next appointment: 1/23 at 10 AM

## 2024-06-16 ENCOUNTER — Telehealth: Payer: Self-pay | Admitting: Neurology

## 2024-06-16 NOTE — Telephone Encounter (Signed)
 NPSG T[employer] pending

## 2024-06-17 ENCOUNTER — Telehealth: Payer: Self-pay | Admitting: Obstetrics and Gynecology

## 2024-06-17 NOTE — Telephone Encounter (Signed)
 LM for pt with neg MyRisk results. IBIS=6.7%/riskscore=8.2%. No increased screening recommendations. FH pancreatic and prostate cancer.   Told her results only apply to her and her children, and this is not indicative of genetic testing results of her other family members. It is recommended that her other family members have genetic testing done.  Also stated negative genetic testing doesn't mean she will never get any of these cancers.  Pt has hard copy of results through [facility] portal. F/u prn.

## 2024-07-01 ENCOUNTER — Encounter: Payer: Self-pay | Admitting: Neurology

## 2024-07-07 NOTE — Telephone Encounter (Signed)
 T[employer]are still pending up dated information on the [facility] tricare portal.
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
## 2024-07-22 ENCOUNTER — Encounter: Payer: Self-pay | Admitting: Psychiatry

## 2024-07-22 ENCOUNTER — Telehealth: Admitting: Psychiatry

## 2024-07-22 DIAGNOSIS — T43505A Adverse effect of unspecified antipsychotics and neuroleptics, initial encounter: Secondary | ICD-10-CM

## 2024-07-22 DIAGNOSIS — F419 Anxiety disorder, unspecified: Secondary | ICD-10-CM | POA: Diagnosis not present

## 2024-07-22 DIAGNOSIS — F3181 Bipolar II disorder: Secondary | ICD-10-CM

## 2024-07-22 DIAGNOSIS — R259 Unspecified abnormal involuntary movements: Secondary | ICD-10-CM

## 2024-07-22 DIAGNOSIS — G2401 Drug induced subacute dyskinesia: Secondary | ICD-10-CM

## 2024-07-22 DIAGNOSIS — Z79899 Other long term (current) drug therapy: Secondary | ICD-10-CM

## 2024-07-22 MED ORDER — VENLAFAXINE HCL 100 MG PO TABS: 100.0000 mg | ORAL_TABLET | Freq: Two times a day (BID) | ORAL | 1 refills | Status: AC

## 2024-07-22 MED ORDER — LORAZEPAM 1 MG PO TABS: 1.0000 mg | ORAL_TABLET | Freq: Every day | ORAL | 1 refills | Status: AC | PRN

## 2024-07-22 NOTE — Patient Instructions (Signed)
 Plan to start iloperidone  1 mg at night after completing sleep study/neurology evaluation Continue venlafaxine  100 mg twice a day Continue bupropion  150 mg twice a day  Continue lorazepam  1 mg daily as needed for anxiety  Next appointment: 3/4 at 9:30

## 2024-07-25 NOTE — Telephone Encounter (Signed)
 Referral sent

## 2024-07-25 NOTE — Telephone Encounter (Signed)
 Could you mae a referral to [facility] neurology at [facility] for movement disorder? Thanks. [email or web address]

## 2024-07-26 ENCOUNTER — Encounter: Payer: Self-pay | Admitting: Neurology

## 2024-07-26 ENCOUNTER — Ambulatory Visit: Admitting: Neurology

## 2024-07-26 VITALS — BP 117/73 | HR 86 | Ht 66.0 in | Wt 143.8 lb

## 2024-07-26 DIAGNOSIS — G43009 Migraine without aura, not intractable, without status migrainosus: Secondary | ICD-10-CM | POA: Diagnosis not present

## 2024-07-26 MED ORDER — RIZATRIPTAN BENZOATE 10 MG PO TBDP: 10.0000 mg | ORAL_TABLET | ORAL | 11 refills | Status: AC | PRN

## 2024-07-26 NOTE — Patient Instructions (Signed)
 Great to see you! Continue Maxalt  as needed Reach out if headaches increase Follow-up with PCP Return here as needed.  Thanks!!

## 2024-07-26 NOTE — Progress Notes (Signed)
 "  Chief Complaint  Patient presents with   Follow-up    Pt in room 16 . Alone. Here for migraine follow up.    ASSESSMENT AND PLAN  Sharon Merritt is a 49 y.o. female   Chronic migraine headache - Under good control, about 1/week - Discussed preventative, already on Effexor , baclofen , lorazepam , prefers to hold off for now on preventative  - Continue Maxalt  10 mg melt as needed may combine with Zofran , Aleve  - Pending sleep study - Continue follow-up with primary care, return here as needed  DIAGNOSTIC DATA (LABS, IMAGING, TESTING) - I reviewed patient records, labs, notes, testing and imaging myself where available.   MEDICAL HISTORY:  Sharon Merritt, is a 49 year old female seen in request by her primary care doctor Shona Rush for evaluation of headache, initial evaluation was on December 15, 2023  History is obtained from the patient and review of electronic medical records. I personally reviewed pertinent available imaging films in PACS.   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  She report history of [facility] spotted fever, also Lyme disease, was treated with extended period of antibiotic from 2016-2017, during that period of time, she  was sick like a dog, complains of severe fatigue, could hardly get out of the bed, as if my soul is leaving my body,   Treatment did help her, she was able to function again, around that time she began to notice frequent headache, now it happened couple times each month, lateralized retro-orbital area pain, tends to happen on the right side, pounding, light noise sensitivity, nauseous, pain also extending to her neck, shoulder, it can last hours or days  She had tried Motrin without helping her, was given a sample of [place], was not sure about the benefit, she has such severe nausea, vomit the medicine out   MRI of brain  wo in August 2020, was normal.  Update 07/26/24 SS: Sleep consult with Dr. Buck, pending sleep study. Here for migraines, about 1 per week, Maxalt  works well for her, takes about an hour, will improve nausea. Usually able to continue working. Has not heard anything about sleep study.   PHYSICAL EXAM:   Vitals:   07/26/24 1051  BP: 117/73  Pulse: 86  SpO2: 96%  Weight: 143 lb 12.8 oz (65.2 kg)  Height: 5' 6 (1.676 m)   Body mass index is 23.21 kg/m.  PHYSICAL EXAMNIATION:  Physical Exam  General: The patient is alert and cooperative at the time of the examination.  Skin: No significant peripheral edema is noted.  Neurologic Exam  Mental status: The patient is alert and oriented x 3 at the time of the examination. The patient has apparent normal recent and remote memory, with an apparently normal attention span and concentration ability.  Cranial nerves: Facial symmetry is present. Speech is normal, no aphasia or dysarthria is noted. Extraocular movements are full. Visual fields are full.  Motor: The patient has good strength in all 4 extremities.  Sensory examination: Soft touch sensation is symmetric on the face, arms, and legs.  Coordination: The patient has good finger-nose-finger and heel-to-shin bilaterally.  Gait and station: The patient has a normal gait.  Reflexes: Deep tendon reflexes are symmetric.  REVIEW OF SYSTEMS:  Full 14 system review of systems performed and notable only for as above All other review of systems were negative.   ALLERGIES: No  Known Allergies  HOME MEDICATIONS: Current Outpatient Medications  Medication Sig Dispense Refill   albuterol  (PROVENTIL ) (2.5 MG/3ML) 0.083% nebulizer solution as needed.     albuterol  (VENTOLIN  HFA) 108 (90 Base) MCG/ACT inhaler Inhale 2 puffs into the lungs daily as needed (Asthma).     AMBULATORY NON FORMULARY MEDICATION Medication Name: Testosterone 1 mg/ml topical cream Apply 1 ml pump to labia daily,  avoid transference 30 mL 2   amoxicillin-clavulanate (AUGMENTIN) 875-125 MG tablet Take 1 tablet by mouth. Take 1 tablet every 12 hours for 7 days     baclofen  (LIORESAL ) 10 MG tablet Take 1 tablet every night at bedtime. May also take during the day every 8 hours as needed (max daily dose is 3 tablets total in a 24 hour period). 90 tablet 2   buPROPion  (WELLBUTRIN  SR) 150 MG 12 hr tablet Take 1 tablet (150 mg total) by mouth 2 (two) times daily. 180 tablet 0   cetirizine  (ZYRTEC ) 10 MG tablet once.     famotidine  (PEPCID ) 40 MG tablet Take 40 mg by mouth daily.     fluticasone -salmeterol (ADVAIR  HFA) 115-21 MCG/ACT inhaler Inhale 2 puffs into the lungs 2 (two) times daily.     hydrOXYzine  (ATARAX ) 25 MG tablet Take 1 tablet (25 mg total) by mouth at bedtime. 30 tablet 11   ibuprofen (ADVIL) 800 MG tablet Take by mouth as needed.     Iloperidone  1 MG TABS Take 1 tablet (1 mg total) by mouth at bedtime. 30 tablet 0   LORazepam  (ATIVAN ) 1 MG tablet Take 1 tablet (1 mg total) by mouth daily as needed for up to 60 doses for anxiety. 30 tablet 1   ondansetron  (ZOFRAN ) 8 MG tablet 1 tablet as needed Orally Once a day for 30 day(s)     predniSONE  (DELTASONE ) 5 MG tablet Take 5 mg by mouth daily with breakfast. For 5 days     rizatriptan  (MAXALT -MLT) 10 MG disintegrating tablet Take 1 tablet (10 mg total) by mouth as needed. May repeat in 2 hours if needed 12 tablet 6   Semaglutide-Weight Management (WEGOVY [place]) once a week.     valACYclovir (VALTREX) 1000 MG tablet Take 1,000 mg by mouth daily as needed (Epstein-Barr).     [START ON 08/29/2024] venlafaxine  (EFFEXOR ) 100 MG tablet Take 1 tablet (100 mg total) by mouth 2 (two) times daily. 180 tablet 1   meloxicam (MOBIC) 15 MG tablet Take 15 mg by mouth daily. (Patient not taking: Reported on 07/26/2024)     methocarbamol (ROBAXIN) 500 MG tablet Take 1 tablet every 8 hours by oral route as needed. (Patient not taking: Reported on 07/26/2024)     pantoprazole   (PROTONIX ) 40 MG tablet Take 1 tablet (40 mg total) by mouth daily. (Patient not taking: Reported on 07/26/2024) 90 tablet 0   No current facility-administered medications for this visit.    PAST MEDICAL HISTORY: Past Medical History:  Diagnosis Date   Anxiety    Asthma    Bipolar 1 disorder (HCC)    BRCA negative 04/2024   MyRisk neg; IBIS=6.7%/riskscore=8.2%   Breast discharge    Depression    Epstein Barr infection    Essential hypertension    Family history of pancreatic cancer    in sister   Fibromyalgia    Lyme disease    Pneumonia    [place] spotted fever     PAST SURGICAL HISTORY: Past Surgical History:  Procedure Laterality Date   LAPAROSCOPIC GASTRIC SLEEVE RESECTION  N/A 04/23/2021   Procedure: LAPAROSCOPIC GASTRIC SLEEVE RESECTION;  Surgeon: Stevie, Herlene Righter, MD;  Location: [facility];  Service: General;  Laterality: N/A;   OTHER SURGICAL HISTORY  01/06/2024   [facility] Surgery   OTHER SURGICAL HISTORY Bilateral 05/17/2024   Radiofrequency Ablation   TUBAL LIGATION     UPPER GI ENDOSCOPY N/A 04/23/2021   Procedure: UPPER GI ENDOSCOPY;  Surgeon: Stevie, Herlene Righter, MD;  Location: [facility];  Service: General;  Laterality: N/A;   WISDOM TOOTH EXTRACTION      FAMILY HISTORY: Family History  Problem Relation Age of Onset   COPD Mother    Depression Mother    Diabetes Father    Hypertension Father    Stroke Father    Bipolar disorder Sister    Post-traumatic stress disorder Sister    Alcohol abuse Sister    Drug abuse Sister    Pulmonary embolism Sister    Pancreatic cancer Sister        late 52s   Sleep apnea Sister    Prostate cancer Paternal Grandfather    Bipolar disorder Cousin    Schizophrenia Cousin     SOCIAL HISTORY: Social History   Socioeconomic History   Marital status: Widowed    Spouse name: Toribio    Number of children: 3   Years of education: Assoc    Highest education level: Associate degree: academic program  Occupational  History    Employer: [employer]  Tobacco Use   Smoking status: Former    Current packs/day: 0.00    Types: Cigarettes    Quit date: 02/14/2009    Years since quitting: 15.4   Smokeless tobacco: Never  Vaping Use   Vaping status: Never Used  Substance and Sexual Activity   Alcohol use: Yes    Comment: Rarely    Drug use: No   Sexual activity: Yes    Birth control/protection: Surgical    Comment: Tubal Ligation  Other Topics Concern   Not on file  Social History Narrative   Patient lives at home with her husband Honey) and her children.    Patient works full time.   Caffeine- Tea 3-4 times daily.   Patient is right-handed.   Patient has a Scientist, Research (physical Sciences).         Social Drivers of Health   Tobacco Use: Medium Risk (07/26/2024)   Patient History    Smoking Tobacco Use: Former    Smokeless Tobacco Use: Never    Passive Exposure: Not on Actuary Strain: Not on file  Food Insecurity: Not on file  Transportation Needs: Not on file  Physical Activity: Not on file  Stress: Not on file  Social Connections: Not on file  Intimate Partner Violence: Not on file  Depression (EYV7-0): Not on file  Alcohol Screen: Not on file  Housing: Not on file  Utilities: Not on file  Health Literacy: Not on file   Sharon Merritt, SCHARLENE, DNP  [facility] [address], [address] [place], [place] [address] [phone]  "

## 2024-07-27 NOTE — Telephone Encounter (Signed)
 [employer] shara: [phone] (exp. 06/10/24 to 12/07/24)   Sharon Merritt

## 2024-08-21 ENCOUNTER — Encounter

## 2024-08-31 ENCOUNTER — Telehealth: Admitting: Psychiatry

## 2024-10-24 ENCOUNTER — Ambulatory Visit: Admitting: Urology
